# Patient Record
Sex: Female | Born: 1940 | Race: White | Hispanic: No | Smoking: Former smoker
Health system: Southern US, Community
[De-identification: ages and names within clinical notes are randomized; demographics above are authoritative.]

## PROBLEM LIST (undated history)

## (undated) DIAGNOSIS — M1990 Unspecified osteoarthritis, unspecified site: Secondary | ICD-10-CM

## (undated) DIAGNOSIS — H409 Unspecified glaucoma: Secondary | ICD-10-CM

## (undated) DIAGNOSIS — L719 Rosacea, unspecified: Secondary | ICD-10-CM

## (undated) DIAGNOSIS — K219 Gastro-esophageal reflux disease without esophagitis: Secondary | ICD-10-CM

## (undated) DIAGNOSIS — H04123 Dry eye syndrome of bilateral lacrimal glands: Secondary | ICD-10-CM

## (undated) DIAGNOSIS — Z973 Presence of spectacles and contact lenses: Secondary | ICD-10-CM

## (undated) DIAGNOSIS — I4891 Unspecified atrial fibrillation: Secondary | ICD-10-CM

## (undated) DIAGNOSIS — M8580 Other specified disorders of bone density and structure, unspecified site: Secondary | ICD-10-CM

## (undated) DIAGNOSIS — I10 Essential (primary) hypertension: Secondary | ICD-10-CM

## (undated) HISTORY — DX: Other specified disorders of bone density and structure, unspecified site: M85.80

## (undated) HISTORY — PX: COLONOSCOPY: SHX174

## (undated) HISTORY — PX: TUBAL LIGATION: SHX77

## (undated) HISTORY — DX: Unspecified atrial fibrillation: I48.91

---

## 1977-11-14 HISTORY — PX: TONSILLECTOMY AND ADENOIDECTOMY: SUR1326

## 1998-05-20 ENCOUNTER — Ambulatory Visit (HOSPITAL_COMMUNITY): Admission: RE | Admit: 1998-05-20 | Discharge: 1998-05-20 | Payer: Self-pay | Admitting: Neurosurgery

## 1998-06-17 ENCOUNTER — Encounter: Admission: RE | Admit: 1998-06-17 | Discharge: 1998-09-15 | Payer: Self-pay | Admitting: Anesthesiology

## 2003-06-04 ENCOUNTER — Ambulatory Visit (HOSPITAL_COMMUNITY): Admission: RE | Admit: 2003-06-04 | Discharge: 2003-06-04 | Payer: Self-pay | Admitting: Gastroenterology

## 2003-06-04 ENCOUNTER — Encounter (INDEPENDENT_AMBULATORY_CARE_PROVIDER_SITE_OTHER): Payer: Self-pay | Admitting: *Deleted

## 2004-01-09 ENCOUNTER — Other Ambulatory Visit: Admission: RE | Admit: 2004-01-09 | Discharge: 2004-01-09 | Payer: Self-pay | Admitting: Gynecology

## 2005-01-10 ENCOUNTER — Other Ambulatory Visit: Admission: RE | Admit: 2005-01-10 | Discharge: 2005-01-10 | Payer: Self-pay | Admitting: Gynecology

## 2006-01-20 ENCOUNTER — Other Ambulatory Visit: Admission: RE | Admit: 2006-01-20 | Discharge: 2006-01-20 | Payer: Self-pay | Admitting: Gynecology

## 2007-01-26 ENCOUNTER — Other Ambulatory Visit: Admission: RE | Admit: 2007-01-26 | Discharge: 2007-01-26 | Payer: Self-pay | Admitting: Gynecology

## 2007-12-24 ENCOUNTER — Ambulatory Visit: Payer: Self-pay | Admitting: Cardiology

## 2008-01-04 ENCOUNTER — Ambulatory Visit: Payer: Self-pay | Admitting: Cardiology

## 2008-03-07 ENCOUNTER — Other Ambulatory Visit: Admission: RE | Admit: 2008-03-07 | Discharge: 2008-03-07 | Payer: Self-pay | Admitting: Gynecology

## 2010-07-24 ENCOUNTER — Emergency Department (HOSPITAL_COMMUNITY): Admission: EM | Admit: 2010-07-24 | Discharge: 2010-07-24 | Payer: Self-pay | Admitting: Emergency Medicine

## 2011-03-29 NOTE — Procedures (Signed)
[facility]                              EXERCISE TREADMILL   NAME:Kelsey James, Kelsey James                     MRN:          [id number]  DATE:01/04/2008                            DOB:          March 06, 1941    PRIMARY:  Dr. Herb Grays.   THE REASON FOR PRESENTATION:  Evaluate the patient with dizziness and  vague chest discomfort.   PROCEDURE NOTE:  The patient exercised using standard Bruce protocol.  She was able to exercise for 6 minutes and 30 seconds.  This is 30  seconds into stage III.  Test was terminated because of fatigue and  because she had achieved her heart rate.  Peak was 148 beats per minute  which was 95% of predicted.  She had achieved 7.7 METS.  Her blood  pressure was appropriate with a max of 176/92.  She had a few PACs.  She  had no chest discomfort.  She had some dyspnea when she initially  started out, feeling like she could not get a deep breath.  However, the  further she went, the steeper it got, the better her breathing became.  There were no ischemic ST-T wave changes and she had normal heart rate  recovery.   CONCLUSIONS:  Negative adequate exercise treadmill test.   ASSESSMENT/PLAN:  Based on this.  I do not see any problem with her  blood pressure or arrhythmias, but could indicate her dizzy episodes.  Though she had some subjective complaints of shortness of breath, this  actually got better when she got more active.  At this point I would not  think that further cardiovascular testing was warranted.  Rather, she  will go back to Dr. Collins Scotland to discuss whether these dizzy episodes could  be some other mechanism such as inner ear.  Certainly, we could consider  monitoring if they continue and there is no other etiology.     Rollene Rotunda, MD, [facility]  Electronically Signed    JH/MedQ  DD: 01/04/2008  DT: 01/05/2008  Job #: [id number]   cc:   Tammy R. Collins Scotland, M.D.

## 2011-03-29 NOTE — Assessment & Plan Note (Signed)
[facility]                            CARDIOLOGY OFFICE NOTE   NAME:Kelsey James, Kelsey James                     MRN:          [id number]  DATE:12/24/2007                            DOB:          29-May-1941    REFERRING PHYSICIAN:  Tammy R. Collins Scotland, M.D.   REASON FOR CONSULTATION:  Evaluate patient with chest pain and  presyncope.   HISTORY OF PRESENT ILLNESS:  The patient is a pleasant 70 year old white  female without prior cardiac history.  She has had some palpitations for  years but not had any formal diagnosis of this.  About two weeks ago she  was in the kitchen cooking.  She felt dizzy and faint.  She had  substernal chest discomfort.  It was moderately severe.  She felt like a  fist was tight in her chest.  She went out of the kitchen and told her  husband.  She was noted to have a blood pressure that was elevated at  164/diastolics in the 90s.  She felt discomfort for about 10-15 minutes.  She sat down.  Through the course of the evening, she had resolution of  her blood pressure.  She has had this sporadic chest pain much lighter  than this off and on.  This has been at rest.  She has not been able to  bring this on.  She was a little lightheaded yesterday in church, but  has had none of the dizziness that she had that day two weeks ago.  She  has not had any frank syncope.  She has not associated this chest  discomfort of an episode with any of the palpitations.  These are  infrequent, though a little bit more frequent she thinks recently.  She  is active doing yard work and housework such as vacuuming.  She cannot  bring on these symptoms.  She cannot bring on any substernal chest  pressure, neck or arm discomfort.  She does not have any excessive  shortness of breath.  She has no symptoms at rest.  She has no PND or  orthopnea.  She did also complain of numbness in her neck.  This  happened lying flat.  She noticed numbness radiating down to  the tips of  her fingers.   PAST MEDICAL HISTORY:  1. Palpitations.  2. Insomnia.   PAST SURGICAL HISTORY:  Tonsillectomy.   ALLERGIES:  None.   MEDICATIONS:  Magnesium, calcium, multivitamin, vitamin C, fish oil.   SOCIAL HISTORY:  The patient quit smoking in 2004 after less than a pack  per day for 30 years.  She occasionally drinks wine.   FAMILY HISTORY:  Noncontributory for early coronary artery disease.  Her  mother died with valve disease.   REVIEW OF SYSTEMS:  As stated in the HPI and positive for joint pain.  Negative for other systems.   PHYSICAL EXAMINATION:  GENERAL APPEARANCE:  The patient is in no  distress.  VITAL SIGNS:  Blood 112/70, heart rate 70 and regular, weight 129  pounds, body mass index 22.  HEENT:  Eyelids unremarkable,.  Pupils equal,  round and reactive to  light, fundi well visualized, obvious changes of histoplasmosis, oral  mucosa unremarkable.  NECK:  No jugular venous distension at 45 degrees, carotid upstroke  brisk and symmetric.  No bruits, no thyromegaly.  LYMPHATICS:  No cervical, axillary or inguinal adenopathy.  LUNGS:  Clear to auscultation bilaterally.  BACK:  No costovertebral angle tenderness.  CHEST:  Unremarkable.  HEART:  PMI not displaced or sustained, S1-S2 within normal limits.  No  S3, no S4, no clicks, no rubs, no murmurs.  ABDOMEN:  Flat, positive bowel sounds, normal in frequency and pitch, no  bruits, no rebound, no guarding, no midline pulsatile mass.  No  hepatomegaly, no splenomegaly.  SKIN:  No rashes, no nodules.  EXTREMITIES:  She has 2+ pulses throughout, no edema, no cyanosis, no  clubbing.  NEUROLOGIC:  Oriented to person, place and time.  Cranial nerves II-XII  grossly intact, motor grossly intact throughout.   EKG (Dr. Alda Berthold office), sinus rhythm, rate 60, axis within normal  limits, intervals within normal limits, poor anterior R wave  progression, no acute ST-T wave changes.   ASSESSMENT/PLAN:  1.  Chest discomfort.  The patient had an episode of chest discomfort      that was somewhat severe a couple of weeks ago and then lesser      episode since then.  She does have the risk factor of tobacco.  I      think the pretest probability of obstructive coronary disease      leading to these symptoms is moderately low.  However, she should      be screened.  I will perform a POET (plain old exercise treadmill      test).  Think this would be reasonable for excluding obstructive      coronary disease.  This will also allow me to give her a      prescription for exercise and risk stratify.  2. Palpitations.  She does describe these.  However, this does not      seem like this is what caused her episode.  If she has an otherwise      normal heart and these are not particularly symptomatic, I would      probably not pursue further evaluation outside of routine labs to      include magnesium, potassium and TSH.  I will defer to Dr. Collins Scotland to      draw these.  3. Follow-up.  I will see her at the time of her treadmill.     Rollene Rotunda, MD, [facility]  Electronically Signed    JH/MedQ  DD: 12/24/2007  DT: 12/25/2007  Job #: [id number]

## 2011-04-01 NOTE — Op Note (Signed)
NAME:  Kelsey James, Kelsey James                        ACCOUNT NO.:  [id number]   MEDICAL RECORD NO.:  [id number]                   PATIENT TYPE:  AMB   LOCATION:  ENDO                                 FACILITY:  [facility]   PHYSICIAN:  Anselmo Rod, M.D.               DATE OF BIRTH:  14-Feb-1941   DATE OF PROCEDURE:  06/04/2003  DATE OF DISCHARGE:                                 OPERATIVE REPORT   PROCEDURE PERFORMED:  Colonoscopy with cold biopsies times two.   ENDOSCOPIST:  Charna Elizabeth, M.D.   INSTRUMENT USED:  Olympus video colonoscope.   INDICATIONS FOR PROCEDURE:  The patient is a 70 year old white female with  history of bright red blood per rectum.  Undergoing screening colonoscopy to  rule out colonic polyps, masses, etc.   PREPROCEDURE PREPARATION:  Informed consent was procured from the patient.  The patient was fasted for eight hours prior to the procedure and prepped  with a bottle of magnesium citrate and a gallon of GoLYTELY the night prior  to the procedure.   PREPROCEDURE PHYSICAL:  The patient had stable vital signs.  Neck supple.  Chest clear to auscultation.  S1 and S2 regular.  Abdomen soft with normal  bowel sounds.   DESCRIPTION OF PROCEDURE:  The patient was placed in left lateral decubitus  position and sedated with 70 mg of Demerol and 7 mg of Versed intravenously.  MVP prophylaxis.  Once the patient was adequately sedated and maintained on  low flow oxygen and continuous cardiac monitoring, the Olympus video  colonoscope was advanced from the rectum to the cecum and terminal ileum.  The appendicular orifice and ileocecal valve were clearly visualized and  photographed.  The terminal ileum appeared normal.  There was some residual  stool in the right colon and cecum.  Multiple washes were done.  Two small  sessile polyps were biopsied from the rectosigmoid area at 15 cm.  No  evidence of diverticulosis was noted.  No large masses or polyps were  present.   Retroflexion in the rectum revealed small nonbleeding internal  hemorrhoids.  The patient tolerated the procedure well without  complications.   IMPRESSION:  1. Small nonbleeding internal hemorrhoid.  2. Two small sessile polyps biopsied from the rectosigmoid area at 15 cm.  3. Otherwise normal colonoscopy of the terminal ileum.  4. Some residual stool in the cecum and right colon.  Multiple washes done.  5. No diverticulosis noted.   RECOMMENDATIONS:  1. Await pathology results.  2. Avoid all nonsteroidals including aspirin for the next two weeks.  3. A high fiber diet with liberal fluid intake.  4. Repeat colorectal cancer screening depending on pathology results or     earlier if the patient develops any abnormal GI symptoms in the interim.  5. Outpatient follow-up for further recommendations.  Anselmo Rod, M.D.    JNM/MEDQ  D:  06/04/2003  T:  06/04/2003  Job:  [id number]   cc:   Tammy R. Collins Scotland, M.D.  [address]  [place]  [place] [address]  Fax: [phone]

## 2012-02-08 ENCOUNTER — Other Ambulatory Visit: Payer: Self-pay | Admitting: Oral Surgery

## 2012-02-08 DIAGNOSIS — R6884 Jaw pain: Secondary | ICD-10-CM

## 2012-03-13 ENCOUNTER — Ambulatory Visit
Admission: RE | Admit: 2012-03-13 | Discharge: 2012-03-13 | Disposition: A | Discharge status: Home / Self Care | Payer: Medicare Other | Source: Ambulatory Visit | Attending: Oral Surgery | Admitting: Oral Surgery

## 2012-03-13 DIAGNOSIS — R6884 Jaw pain: Secondary | ICD-10-CM

## 2013-12-11 DIAGNOSIS — H16109 Unspecified superficial keratitis, unspecified eye: Secondary | ICD-10-CM | POA: Diagnosis not present

## 2013-12-11 DIAGNOSIS — H4060X0 Glaucoma secondary to drugs, unspecified eye, stage unspecified: Secondary | ICD-10-CM | POA: Diagnosis not present

## 2013-12-11 DIAGNOSIS — H04129 Dry eye syndrome of unspecified lacrimal gland: Secondary | ICD-10-CM | POA: Diagnosis not present

## 2013-12-17 DIAGNOSIS — H409 Unspecified glaucoma: Secondary | ICD-10-CM | POA: Diagnosis not present

## 2013-12-17 DIAGNOSIS — H04129 Dry eye syndrome of unspecified lacrimal gland: Secondary | ICD-10-CM | POA: Diagnosis not present

## 2013-12-17 DIAGNOSIS — H4050X0 Glaucoma secondary to other eye disorders, unspecified eye, stage unspecified: Secondary | ICD-10-CM | POA: Diagnosis not present

## 2013-12-23 ENCOUNTER — Emergency Department (HOSPITAL_COMMUNITY)
Admission: EM | Admit: 2013-12-23 | Discharge: 2013-12-23 | Disposition: A | Discharge status: Home / Self Care | Payer: Medicare Other | Attending: Emergency Medicine | Admitting: Emergency Medicine

## 2013-12-23 ENCOUNTER — Emergency Department (HOSPITAL_COMMUNITY): Payer: Medicare Other

## 2013-12-23 ENCOUNTER — Encounter (HOSPITAL_COMMUNITY): Payer: Self-pay | Admitting: Emergency Medicine

## 2013-12-23 DIAGNOSIS — I10 Essential (primary) hypertension: Secondary | ICD-10-CM

## 2013-12-23 DIAGNOSIS — Z792 Long term (current) use of antibiotics: Secondary | ICD-10-CM | POA: Insufficient documentation

## 2013-12-23 DIAGNOSIS — R42 Dizziness and giddiness: Secondary | ICD-10-CM | POA: Diagnosis not present

## 2013-12-23 DIAGNOSIS — Z79899 Other long term (current) drug therapy: Secondary | ICD-10-CM | POA: Insufficient documentation

## 2013-12-23 DIAGNOSIS — R011 Cardiac murmur, unspecified: Secondary | ICD-10-CM | POA: Diagnosis not present

## 2013-12-23 DIAGNOSIS — R51 Headache: Secondary | ICD-10-CM | POA: Diagnosis not present

## 2013-12-23 DIAGNOSIS — R110 Nausea: Secondary | ICD-10-CM | POA: Insufficient documentation

## 2013-12-23 DIAGNOSIS — Z872 Personal history of diseases of the skin and subcutaneous tissue: Secondary | ICD-10-CM | POA: Insufficient documentation

## 2013-12-23 DIAGNOSIS — Z8739 Personal history of other diseases of the musculoskeletal system and connective tissue: Secondary | ICD-10-CM | POA: Diagnosis not present

## 2013-12-23 DIAGNOSIS — Z87891 Personal history of nicotine dependence: Secondary | ICD-10-CM | POA: Insufficient documentation

## 2013-12-23 DIAGNOSIS — H9319 Tinnitus, unspecified ear: Secondary | ICD-10-CM | POA: Insufficient documentation

## 2013-12-23 DIAGNOSIS — H409 Unspecified glaucoma: Secondary | ICD-10-CM | POA: Insufficient documentation

## 2013-12-23 HISTORY — DX: Rosacea, unspecified: L71.9

## 2013-12-23 HISTORY — DX: Unspecified osteoarthritis, unspecified site: M19.90

## 2013-12-23 HISTORY — DX: Unspecified glaucoma: H40.9

## 2013-12-23 HISTORY — DX: Dry eye syndrome of bilateral lacrimal glands: H04.123

## 2013-12-23 LAB — URINALYSIS, ROUTINE W REFLEX MICROSCOPIC
Bilirubin Urine: NEGATIVE
Glucose, UA: NEGATIVE mg/dL
Hgb urine dipstick: NEGATIVE
LEUKOCYTES UA: NEGATIVE
Nitrite: NEGATIVE
PROTEIN: NEGATIVE mg/dL
Specific Gravity, Urine: <1.005 — ABNORMAL LOW (ref 1.005–1.030)
Urobilinogen, UA: 0.2 mg/dL (ref 0.0–1.0)
pH: 5.5 (ref 5.0–8.0)

## 2013-12-23 LAB — BASIC METABOLIC PANEL
BUN: 11 mg/dL (ref 6–23)
CALCIUM: 9 mg/dL (ref 8.4–10.5)
CHLORIDE: 99 meq/L (ref 96–112)
CO2: 27 mEq/L (ref 19–32)
CREATININE: 0.53 mg/dL (ref 0.50–1.10)
GFR calc non Af Amer: >90 mL/min (ref >90)
Glucose, Bld: 90 mg/dL (ref 70–99)
Potassium: 3.7 mEq/L (ref 3.7–5.3)
Sodium: 138 mEq/L (ref 137–147)

## 2013-12-23 LAB — CBC
HCT: 38.3 % (ref 36.0–46.0)
Hemoglobin: 13.3 g/dL (ref 12.0–15.0)
MCH: 31.7 pg (ref 26.0–34.0)
MCHC: 34.7 g/dL (ref 30.0–36.0)
MCV: 91.4 fL (ref 78.0–100.0)
PLATELETS: 234 10*3/uL (ref 150–400)
RBC: 4.19 MIL/uL (ref 3.87–5.11)
RDW: 14.3 % (ref 11.5–15.5)
WBC: 7.3 10*3/uL (ref 4.0–10.5)

## 2013-12-23 LAB — HEPATIC FUNCTION PANEL
ALT: 15 U/L (ref 0–35)
AST: 16 U/L (ref 0–37)
Albumin: 4 g/dL (ref 3.5–5.2)
Alkaline Phosphatase: 39 U/L (ref 39–117)
Total Bilirubin: 0.5 mg/dL (ref 0.3–1.2)
Total Protein: 6.6 g/dL (ref 6.0–8.3)

## 2013-12-23 LAB — GLUCOSE, CAPILLARY: Glucose-Capillary: 73 mg/dL (ref 70–99)

## 2013-12-23 MED ORDER — SODIUM CHLORIDE 0.9 % IV BOLUS (SEPSIS)
1000.0000 mL | Freq: Once | INTRAVENOUS | Status: AC
  Administered 2013-12-23: 1000 mL via INTRAVENOUS

## 2013-12-23 NOTE — ED Provider Notes (Signed)
CSN: [id number]     Arrival date & time 12/23/13  1547 History  This chart was scribed for Maudry Diego, MD by Elby Beck, ED Scribe. This patient was seen in room APA02/APA02 and the patient's care was started at 6:26 PM.  Chief Complaint  Patient presents with  . Dizziness    Patient is a 73 y.o. female presenting with dizziness. The history is provided by the patient. No language interpreter was used.  Dizziness Quality:  Unable to specify Severity:  Moderate Onset quality:  Gradual Duration:  3 days Timing:  Intermittent Progression:  Worsening Chronicity:  New Context comment:  Believes this may be related to higher than usual BP readings at home recently Relieved by:  None tried Worsened by:  Nothing tried Ineffective treatments:  None tried Associated symptoms: nausea and tinnitus   Associated symptoms: no chest pain, no diarrhea, no headaches and no vomiting   Risk factors: hx of vertigo     HPI Comments: Kelsey James is a 73 y.o. female who presents to the Emergency Department complaining of intermittent episodes of dizziness over the past 3 days. She also reports associated intermittent nausea and tinnitus and states that she has been hearing a "swishing" sound over the past 3 days. She further states that she has been "feeling funny" over the past 3 days. In describing this feeling, she states "I feel at times like I'm not really here and like I'm descending". She reports that she has a history of vertigo, but that these symptoms feel different. She states that she has been taking her blood pressure at home recently, and that she took a BP of 180/92 when her dizziness onset, and a highest BP of 204/89 this afternoon. She also states that over the past 3 days she has taken what she considers to be normal BPs, such as 131/75. She denies vomiting or any other pain or symptoms.   Past Medical History  Diagnosis Date  . Glaucoma   . Rosacea   . Dry eyes   . Arthritis    . Heart murmur    History reviewed. No pertinent past surgical history. History reviewed. No pertinent family history. History  Substance Use Topics  . Smoking status: Former Smoker -- 0.50 packs/day for 25 years    Types: Cigarettes    Quit date: 11/22/2002  . Smokeless tobacco: Never Used  . Alcohol Use: 8.4 oz/week    14 Glasses of wine per week   OB History   Grav Para Term Preterm Abortions TAB SAB Ect Mult Living   2 2  2      2      Review of Systems  Constitutional: Negative for appetite change and fatigue.  HENT: Positive for tinnitus. Negative for congestion, ear discharge and sinus pressure.   Eyes: Negative for discharge.  Respiratory: Negative for cough.   Cardiovascular: Negative for chest pain.  Gastrointestinal: Positive for nausea. Negative for vomiting, abdominal pain and diarrhea.  Genitourinary: Negative for frequency and hematuria.  Musculoskeletal: Negative for back pain.  Skin: Negative for rash.  Neurological: Positive for dizziness. Negative for seizures and headaches.  Psychiatric/Behavioral: Negative for hallucinations.  All other systems reviewed and are negative.   Allergies  Other and Shellfish allergy  Home Medications   Current Outpatient Rx  Name  Route  Sig  Dispense  Refill  . Calcium Carbonate-Vitamin D (CALCIUM 500 + D PO)   Oral   Take 1 tablet by mouth 3 (three)  times daily.         . Calcium-Magnesium (CALCIUM MAGNESIUM 750) 300-300 MG TABS   Oral   Take 1 tablet by mouth 3 (three) times daily.         . cholecalciferol (VITAMIN D) 1000 UNITS tablet   Oral   Take 1,000 Units by mouth daily.         . cycloSPORINE (RESTASIS) 0.05 % ophthalmic emulsion   Both Eyes   Place 1 drop into both eyes 2 (two) times daily.         Marland Kitchen doxycycline (VIBRAMYCIN) 50 MG capsule   Oral   Take 50 mg by mouth at bedtime.          . Lutein 10 MG TABS   Oral   Take 10 mg by mouth daily.         Marland Kitchen omega-3 acid ethyl esters  (LOVAZA) 1 G capsule   Oral   Take 4 g by mouth daily.         . timolol (TIMOPTIC) 0.5 % ophthalmic solution   Both Eyes   Place 1 drop into both eyes 2 (two) times daily.         . vitamin B-12 (CYANOCOBALAMIN) 1000 MCG tablet   Oral   Take 1,000 mcg by mouth daily.           Triage Vitals: BP 175/82  Pulse 52  Temp(Src) 97.7 F (36.5 C) (Oral)  Resp 18  Ht 5\' 4"  (1.626 m)  Wt 122 lb (55.339 kg)  BMI 20.93 kg/m2  SpO2 100%  Physical Exam  Nursing note and vitals reviewed. Constitutional: She is oriented to person, place, and time. She appears well-developed.  HENT:  Head: Normocephalic.  Eyes: Conjunctivae and EOM are normal. No scleral icterus.  Neck: Neck supple. No thyromegaly present.  Cardiovascular: Normal rate and regular rhythm.  Exam reveals no gallop and no friction rub.   No murmur heard. Pulmonary/Chest: No stridor. She has no wheezes. She has no rales. She exhibits no tenderness.  Abdominal: She exhibits no distension. There is no tenderness. There is no rebound.  Musculoskeletal: Normal range of motion. She exhibits no edema.  Lymphadenopathy:    She has no cervical adenopathy.  Neurological: She is oriented to person, place, and time. She exhibits normal muscle tone. Coordination normal.  Skin: No rash noted. No erythema.  Psychiatric: She has a normal mood and affect. Her behavior is normal.    ED Course  Procedures (including critical care time)  DIAGNOSTIC STUDIES: Oxygen Saturation is 100% on RA, normal by my interpretation.    COORDINATION OF CARE: 6:29 PM- Discussed plan to obtain diagnostic radiology and lab work. Pt advised of plan for treatment and pt agrees.  9:57 PM- Recheck and pt states that she is feeling better. Discussed normal lab work findings.  Medications  sodium chloride 0.9 % bolus 1,000 mL (0 mLs Intravenous Stopped 12/23/13 2004)   Labs Review Labs Reviewed  URINALYSIS, ROUTINE W REFLEX MICROSCOPIC - Abnormal;  Notable for the following:    Specific Gravity, Urine <1.005 (*)    Ketones, ur TRACE (*)    All other components within normal limits  CBC  BASIC METABOLIC PANEL  HEPATIC FUNCTION PANEL  GLUCOSE, CAPILLARY   Imaging Review Dg Chest 2 View  12/23/2013   CLINICAL DATA:  Dizziness, elevated blood pressure  EXAM: CHEST  2 VIEW  COMPARISON:  None.  FINDINGS: The heart size and mediastinal contours are within  normal limits. The aorta is tortuous. There is no focal infiltrate, pulmonary edema, or pleural effusion. Bilateral symmetrical nipple shadows are noted. The visualized skeletal structures are unremarkable.  IMPRESSION: No active cardiopulmonary disease.   Electronically Signed   By: Abelardo Diesel M.D.   On: 12/23/2013 19:18   Ct Head Wo Contrast  12/23/2013   CLINICAL DATA:  Headache.  Hypertension.  EXAM: CT HEAD WITHOUT CONTRAST  TECHNIQUE: Contiguous axial images were obtained from the base of the skull through the vertex without intravenous contrast.  COMPARISON:  None.  FINDINGS: There is a CSF density lesion over the right parietal convexities measuring 2.9 x 1.3 cm on image 21. The overlying calvarium is unremarkable in appearance. There is no hemorrhage, midline shift, acute infarction, hydrocephalus or pneumocephalus. The calvarium is intact.  IMPRESSION: No acute abnormality.  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 Nonemergent MRI of the brain with and without contrast could be used for definitive characterization.   Electronically Signed   By: Inge Rise M.D.   On: 12/23/2013 19:26    EKG Interpretation    Date/Time:  Monday December 23 2013 16:26:25 EST Ventricular Rate:  58 PR Interval:  148 QRS Duration: 84 QT Interval:  460 QTC Calculation: 451 R Axis:   59 Text Interpretation:  Sinus bradycardia Otherwise normal ECG No previous ECGs available Confirmed by Sakeena Teall  MD, Dylin Breeden ([id number]) on 12/23/2013  10:05:03 PM            MDM  Dizziness improved.  Pt to follow up for mri Final diagnoses:  None    The chart was scribed for me under my direct supervision.  I personally performed the history, physical, and medical decision making and all procedures in the evaluation of this patient.Maudry Diego, MD 12/23/13 [phone]

## 2013-12-23 NOTE — Discharge Instructions (Signed)
Follow up with your md this week.  Return if any problems °

## 2013-12-23 NOTE — ED Notes (Signed)
Pt reports that she is still feeling dizzy when standing up and walking. Able to ambulate to the bathroom in straight line with stand-by assist with no distress.

## 2013-12-23 NOTE — ED Notes (Signed)
Patient c/o dizziness since Saturday with a "whooshing" and ringing in ears. Denies any weakness or blurred vision. Per husband patient's blood pressure 204/89 at 3:30pm today. Patient reports nausea but no vomiting. Patient states she has a "sinking, floating feeling."

## 2013-12-25 DIAGNOSIS — H2510 Age-related nuclear cataract, unspecified eye: Secondary | ICD-10-CM | POA: Diagnosis not present

## 2013-12-25 DIAGNOSIS — I10 Essential (primary) hypertension: Secondary | ICD-10-CM | POA: Diagnosis not present

## 2013-12-25 DIAGNOSIS — Z5181 Encounter for therapeutic drug level monitoring: Secondary | ICD-10-CM | POA: Diagnosis not present

## 2013-12-25 DIAGNOSIS — R51 Headache: Secondary | ICD-10-CM | POA: Diagnosis not present

## 2013-12-25 DIAGNOSIS — H0289 Other specified disorders of eyelid: Secondary | ICD-10-CM | POA: Diagnosis not present

## 2013-12-25 DIAGNOSIS — G609 Hereditary and idiopathic neuropathy, unspecified: Secondary | ICD-10-CM | POA: Diagnosis not present

## 2013-12-25 DIAGNOSIS — R42 Dizziness and giddiness: Secondary | ICD-10-CM | POA: Diagnosis not present

## 2013-12-25 DIAGNOSIS — E785 Hyperlipidemia, unspecified: Secondary | ICD-10-CM | POA: Diagnosis not present

## 2013-12-25 DIAGNOSIS — M899 Disorder of bone, unspecified: Secondary | ICD-10-CM | POA: Diagnosis not present

## 2013-12-25 DIAGNOSIS — H04129 Dry eye syndrome of unspecified lacrimal gland: Secondary | ICD-10-CM | POA: Diagnosis not present

## 2013-12-25 DIAGNOSIS — M949 Disorder of cartilage, unspecified: Secondary | ICD-10-CM | POA: Diagnosis not present

## 2013-12-25 DIAGNOSIS — G930 Cerebral cysts: Secondary | ICD-10-CM | POA: Diagnosis not present

## 2013-12-25 DIAGNOSIS — H9319 Tinnitus, unspecified ear: Secondary | ICD-10-CM | POA: Diagnosis not present

## 2013-12-25 DIAGNOSIS — H02409 Unspecified ptosis of unspecified eyelid: Secondary | ICD-10-CM | POA: Diagnosis not present

## 2013-12-27 ENCOUNTER — Other Ambulatory Visit (HOSPITAL_COMMUNITY): Payer: Self-pay | Admitting: Family Medicine

## 2013-12-27 ENCOUNTER — Ambulatory Visit (HOSPITAL_COMMUNITY)
Admission: RE | Admit: 2013-12-27 | Discharge: 2013-12-27 | Disposition: A | Discharge status: Home / Self Care | Payer: Medicare Other | Source: Ambulatory Visit | Attending: Vascular Surgery | Admitting: Vascular Surgery

## 2013-12-27 DIAGNOSIS — H93A3 Pulsatile tinnitus, bilateral: Secondary | ICD-10-CM

## 2013-12-27 DIAGNOSIS — H9319 Tinnitus, unspecified ear: Secondary | ICD-10-CM | POA: Diagnosis not present

## 2013-12-30 DIAGNOSIS — H4050X0 Glaucoma secondary to other eye disorders, unspecified eye, stage unspecified: Secondary | ICD-10-CM | POA: Diagnosis not present

## 2013-12-30 DIAGNOSIS — H04129 Dry eye syndrome of unspecified lacrimal gland: Secondary | ICD-10-CM | POA: Diagnosis not present

## 2013-12-30 DIAGNOSIS — H32 Chorioretinal disorders in diseases classified elsewhere: Secondary | ICD-10-CM | POA: Diagnosis not present

## 2013-12-30 DIAGNOSIS — B399 Histoplasmosis, unspecified: Secondary | ICD-10-CM | POA: Diagnosis not present

## 2013-12-30 DIAGNOSIS — H40049 Steroid responder, unspecified eye: Secondary | ICD-10-CM | POA: Diagnosis not present

## 2013-12-30 DIAGNOSIS — H409 Unspecified glaucoma: Secondary | ICD-10-CM | POA: Diagnosis not present

## 2014-01-14 ENCOUNTER — Encounter: Payer: Self-pay | Admitting: Internal Medicine

## 2014-01-14 ENCOUNTER — Ambulatory Visit (INDEPENDENT_AMBULATORY_CARE_PROVIDER_SITE_OTHER): Payer: Medicare Other | Admitting: Internal Medicine

## 2014-01-14 VITALS — BP 118/68 | HR 54 | Ht 64.0 in | Wt 118.7 lb

## 2014-01-14 DIAGNOSIS — I10 Essential (primary) hypertension: Secondary | ICD-10-CM | POA: Insufficient documentation

## 2014-01-14 DIAGNOSIS — R0789 Other chest pain: Secondary | ICD-10-CM | POA: Insufficient documentation

## 2014-01-14 DIAGNOSIS — R600 Localized edema: Secondary | ICD-10-CM | POA: Insufficient documentation

## 2014-01-14 DIAGNOSIS — R609 Edema, unspecified: Secondary | ICD-10-CM | POA: Diagnosis not present

## 2014-01-14 NOTE — Progress Notes (Signed)
OFFICE NOTE  Chief Complaint:  Uncontrolled HTN, chest tightness  Primary Care Physician: Florina Ou, MD  HPI:  Kelsey James is a pleasant 73 year old female with little past medical history. For years she's had very low blood pressure however recently her blood pressure has spiked up into the upper 180's to over 119 systolic. She recently presented to the emergency room and any pain with symptoms such as throbbing in her neck and head with uncontrolled blood pressure. Subsequently she was started on low-dose amlodipine and she's had a nice improvement in her blood pressure. She does however report lower extremity swelling in her feet. She also describes what I believe is chest tightness. She actually says that she feels that her chest is "caving in" or "deflating". There is some associated sporadic shortness of breath but it has improved with her blood pressure medicine. She does do some activity and exercise and reports that her symptoms improve with exercise.  PMHx:  Past Medical History  Diagnosis Date  . Glaucoma   . Rosacea   . Dry eyes   . Arthritis   . Heart murmur     Past Surgical History  Procedure Laterality Date  . Tonsillectomy and adenoidectomy  age 60    FAMHx:  Family History  Problem Relation Age of Onset  . Valvular heart disease Mother   . Cancer Maternal Grandmother   . Depression Father     SOCHx:   reports that she quit smoking about 11 years ago. Her smoking use included Cigarettes. She has a 12.5 pack-year smoking history. She has never used smokeless tobacco. She reports that she drinks about 4.2 ounces of alcohol per week. She reports that she does not use illicit drugs.  ALLERGIES:  Allergies  Allergen Reactions  . Other Nausea And Vomiting    Mushrooms   . Shellfish Allergy Nausea And Vomiting    ROS: A comprehensive review of systems was negative except for: Cardiovascular: positive for chest pressure/discomfort and  dyspnea  HOME MEDS: Current Outpatient Prescriptions  Medication Sig Dispense Refill  . amLODipine (NORVASC) 5 MG tablet Take 1 tablet by mouth daily.      Marland Kitchen azelastine (ASTELIN) 137 MCG/SPRAY nasal spray Place 1 spray into both nostrils 2 (two) times daily. Use in each nostril as directed      . b complex vitamins capsule Take 1 capsule by mouth daily.      Marland Kitchen BIOTIN PO Take 1 capsule by mouth daily.      . Calcium-Magnesium (CALCIUM MAGNESIUM 750) 300-300 MG TABS Take 1 tablet by mouth 3 (three) times daily.      . cycloSPORINE (RESTASIS) 0.05 % ophthalmic emulsion Place 1 drop into both eyes 2 (two) times daily.      . fluticasone (CUTIVATE) 0.05 % cream Apply 1 application topically daily as needed.      . hydrocortisone cream 1 % Apply 1 application topically daily as needed for itching.      . hydroxypropyl methylcellulose (ISOPTO TEARS) 2.5 % ophthalmic solution Place 1 drop into both eyes as needed for dry eyes.      Marland Kitchen LECITHIN PO Take 1 capsule by mouth daily.      . LUTEIN PO Take 1 capsule by mouth daily.      Marland Kitchen MAGNESIUM PO Take by mouth daily.      . NON FORMULARY Apply 1 drop to eye 4 (four) times daily. [facility] - natural tear solution made of patient's blood/protein      .  Omega-3 Fatty Acids (FISH OIL PO) Take 4 capsules by mouth daily.      Marland Kitchen OVER THE COUNTER MEDICATION Take 2 capsules by mouth daily. Vegetal Silica      . OVER THE COUNTER MEDICATION Take 1 capsule by mouth daily. Perfect Eyes supplement      . timolol (TIMOPTIC) 0.5 % ophthalmic solution Place 1 drop into both eyes 2 (two) times daily.      . TURMERIC PO Take 1 capsule by mouth daily.       No current facility-administered medications for this visit.    LABS/IMAGING: No results found for this or any previous visit (from the past 48 hour(s)). No results found.  VITALS: BP 118/68  Pulse 54  Ht 5\' 4"  (1.626 m)  Wt 118 lb 11.2 oz (53.842 kg)  BMI 20.36 kg/m2  EXAM: General appearance: alert and  no distress Neck: no carotid bruit, no JVD and thyroid not enlarged, symmetric, no tenderness/mass/nodules Lungs: clear to auscultation bilaterally Heart: regular rate and rhythm, S1, S2 normal, no murmur, click, rub or gallop Abdomen: soft, non-tender; bowel sounds normal; no masses,  no organomegaly Extremities: edema trace pedal edema Pulses: 2+ and symmetric Skin: Skin color, texture, turgor normal. No rashes or lesions Neurologic: Grossly normal Psych: Mood, affect normal  EKG:  Sinus bradycardia 54  ASSESSMENT: 1. Malignant hypertension 2. Chest pressure 3. Bradycardia 4. Dyspnea and exertion  PLAN: 1.   Mrs. Villescas his had uncontrolled hypertension which is new and unusual for her. There may be some associated chest tightness and a feeling of "deflating". This could be an anginal equivalent. I would recommend stress testing to rule out ischemia as a possible cause of her elevated blood pressure. Actually, her blood pressure is very well controlled today. She is however complaining of lower extremity swelling, mostly in her feet. I did assure her that we could consider changing her medication to an alternative antihypertensive agent with less likelihood of causing swelling. Plan to see her back in a few weeks to discuss results of her stress test and most likely I will adjust medication at that time.  Thank you again for the kind referral.  Pixie Casino, MD, [facility] Attending Cardiologist [facility]  HILTY,Kenneth C 01/14/2014, 4:23 PM

## 2014-01-14 NOTE — Patient Instructions (Signed)
Your physician has requested that you have a lexiscan myoview. For further information please visit [email or web address]. Please follow instruction sheet, as given.  Your physician recommends that you schedule a follow-up appointment in 2-3 weeks, after your stress test.   

## 2014-01-15 DIAGNOSIS — D235 Other benign neoplasm of skin of trunk: Secondary | ICD-10-CM | POA: Diagnosis not present

## 2014-01-15 DIAGNOSIS — L259 Unspecified contact dermatitis, unspecified cause: Secondary | ICD-10-CM | POA: Diagnosis not present

## 2014-01-16 ENCOUNTER — Encounter (HOSPITAL_COMMUNITY): Payer: Medicare Other

## 2014-01-20 ENCOUNTER — Telehealth (HOSPITAL_COMMUNITY): Payer: Self-pay | Admitting: *Deleted

## 2014-01-21 DIAGNOSIS — M899 Disorder of bone, unspecified: Secondary | ICD-10-CM | POA: Diagnosis not present

## 2014-01-21 DIAGNOSIS — M949 Disorder of cartilage, unspecified: Secondary | ICD-10-CM | POA: Diagnosis not present

## 2014-01-21 DIAGNOSIS — Z1231 Encounter for screening mammogram for malignant neoplasm of breast: Secondary | ICD-10-CM | POA: Diagnosis not present

## 2014-01-31 ENCOUNTER — Ambulatory Visit (HOSPITAL_COMMUNITY)
Admission: RE | Admit: 2014-01-31 | Discharge: 2014-01-31 | Disposition: A | Discharge status: Home / Self Care | Payer: Medicare Other | Source: Ambulatory Visit | Attending: Internal Medicine | Admitting: Internal Medicine

## 2014-01-31 DIAGNOSIS — R0789 Other chest pain: Secondary | ICD-10-CM | POA: Diagnosis not present

## 2014-01-31 DIAGNOSIS — I10 Essential (primary) hypertension: Secondary | ICD-10-CM | POA: Diagnosis not present

## 2014-01-31 MED ORDER — TECHNETIUM TC 99M SESTAMIBI GENERIC - CARDIOLITE
10.3000 | Freq: Once | INTRAVENOUS | Status: AC | PRN
  Administered 2014-01-31: 10 via INTRAVENOUS

## 2014-01-31 MED ORDER — AMINOPHYLLINE 25 MG/ML IV SOLN
75.0000 mg | Freq: Once | INTRAVENOUS | Status: AC
  Administered 2014-01-31: 75 mg via INTRAVENOUS

## 2014-01-31 MED ORDER — REGADENOSON 0.4 MG/5ML IV SOLN
0.4000 mg | Freq: Once | INTRAVENOUS | Status: AC
  Administered 2014-01-31: 0.4 mg via INTRAVENOUS

## 2014-01-31 MED ORDER — TECHNETIUM TC 99M SESTAMIBI GENERIC - CARDIOLITE
30.5000 | Freq: Once | INTRAVENOUS | Status: AC | PRN
  Administered 2014-01-31: 31 via INTRAVENOUS

## 2014-01-31 NOTE — Procedures (Addendum)
[facility] [address] [place] [address] [phone]  Cardiology Nuclear Med Study  Kelsey James is a 73 y.o. female     MRN : [id number]     DOB: 03/31/1941  Procedure Date: 01/31/2014  Nuclear Med Background Indication for Stress Test:  Evaluation for Ischemia and Abnormal EKG History:  murmur;No prior cardiac or respiratory history reported. No prior NUC study for comparrison. Cardiac Risk Factors: Family History - CAD, History of Smoking and Hypertension  Symptoms:  Chest Pain, Dizziness, DOE, Light-Headedness, Palpitations and SOB   Nuclear Pre-Procedure Caffeine/Decaff Intake:  9:00pm NPO After: 7:00am   IV Site: R Forearm  IV 0.9% NS with Angio Cath:  22g  Chest Size (in):  n/a IV Started by: Azucena Cecil, RN  Height: 5\' 4"  (1.626 m)  Cup Size: B  BMI:  Body mass index is 20.24 kg/(m^2). Weight:  118 lb (53.524 kg)   Tech Comments:  n/a    Nuclear Med Study 1 or 2 day study: 1 day  Stress Test Type:  [place] Provider:  Charlyn Minerva, MD   Resting Radionuclide: Technetium 16m Sestamibi  Resting Radionuclide Dose: 10.3 mCi   Stress Radionuclide:  Technetium 75m Sestamibi  Stress Radionuclide Dose: 30.5 mCi           Stress Protocol Rest HR: 47 Stress HR: 72  Rest BP: 125/78 Stress BP: 120/68  Exercise Time (min): n/a METS: n/a   Predicted Max HR: 149 bpm % Max HR: 51.68 bpm Rate Pressure Product: 9625  Dose of Adenosine (mg):  n/a Dose of Lexiscan: 0.4 mg  Dose of Atropine (mg): n/a Dose of Dobutamine: n/a mcg/kg/min (at max HR)  Stress Test Technologist: Leane Para, CCT Nuclear Technologist: Imagene Riches, CNMT   Rest Procedure:  Myocardial perfusion imaging was performed at rest 45 minutes following the intravenous administration of Technetium 44m Sestamibi. Stress Procedure:  The patient received IV Lexiscan 0.4 mg over 15-seconds.  Technetium 21m Sestamibi injected  at 30-seconds.  The patient experienced marked SOB; 75 mg of IV Aminophylline was administered with resolution of symptoms.  There were no significant changes with Lexiscan.  Quantitative spect images were obtained after a 45 minute delay.  Transient Ischemic Dilatation (Normal <1.22):  0.91 Lung/Heart Ratio (Normal <0.45):  0.18 QGS EDV:  82 ml QGS ESV:  26 ml LV Ejection Fraction: 69%  Rest ECG: NSR - Normal EKG  Stress ECG: No significant change from baseline ECG  QPS Raw Data Images:  Normal; no motion artifact; normal heart/lung ratio. Stress Images:  Moderate sized anteroapical, apical lateral defect Rest Images:  Subtle apical defect Subtraction (SDS):  SDS 8 - Extent 12%  Impression Exercise Capacity:  Lexiscan with no exercise. BP Response:  Normal blood pressure response. Clinical Symptoms:  There is dyspnea. ECG Impression:  No significant ECG changes with Lexiscan. Comparison with Prior Nuclear Study: No previous nuclear study performed  Overall Impression:  Intermediate risk stress nuclear study with reversible distal anteroapical and anterolateral ischemia.  LV Wall Motion:  NL LV Function; NL Wall Motion; EF 69%  Pixie Casino, MD, [place] Certified in Nuclear Cardiology Attending Cardiologist [place], MD  01/31/2014 4:31 PM

## 2014-02-04 ENCOUNTER — Ambulatory Visit (INDEPENDENT_AMBULATORY_CARE_PROVIDER_SITE_OTHER): Payer: Medicare Other | Admitting: Internal Medicine

## 2014-02-04 ENCOUNTER — Encounter: Payer: Self-pay | Admitting: Internal Medicine

## 2014-02-04 VITALS — BP 112/64 | HR 56 | Ht 64.0 in | Wt 122.4 lb

## 2014-02-04 DIAGNOSIS — Z01818 Encounter for other preprocedural examination: Secondary | ICD-10-CM | POA: Diagnosis not present

## 2014-02-04 DIAGNOSIS — R5381 Other malaise: Secondary | ICD-10-CM | POA: Diagnosis not present

## 2014-02-04 DIAGNOSIS — R0789 Other chest pain: Secondary | ICD-10-CM

## 2014-02-04 DIAGNOSIS — R0602 Shortness of breath: Secondary | ICD-10-CM

## 2014-02-04 DIAGNOSIS — R9439 Abnormal result of other cardiovascular function study: Secondary | ICD-10-CM

## 2014-02-04 DIAGNOSIS — D689 Coagulation defect, unspecified: Secondary | ICD-10-CM | POA: Diagnosis not present

## 2014-02-04 DIAGNOSIS — R5383 Other fatigue: Secondary | ICD-10-CM | POA: Diagnosis not present

## 2014-02-04 DIAGNOSIS — I10 Essential (primary) hypertension: Secondary | ICD-10-CM

## 2014-02-04 NOTE — Progress Notes (Signed)
OFFICE NOTE  Chief Complaint:  Uncontrolled HTN, chest tightness  Primary Care Physician: Florina Ou, MD  HPI:  Kelsey James is a pleasant 73 year old female with little past medical history. For years she's had very low blood pressure however recently her blood pressure has spiked up into the upper 180's to over 287 systolic. She recently presented to the emergency room and any pain with symptoms such as throbbing in her neck and head with uncontrolled blood pressure. Subsequently she was started on low-dose amlodipine and she's had a nice improvement in her blood pressure. She does however report lower extremity swelling in her feet. She also describes what I believe is chest tightness. She actually says that she feels that her chest is "caving in" or "deflating". There is some associated sporadic shortness of breath but it has improved with her blood pressure medicine. She does do some activity and exercise and reports that her symptoms improve with exercise.  Kelsey James returns today for followup of her recent nuclear stress test. This study demonstrated reversible distal anteroapical and anterolateral reversible ischemia. EF of 69%.  I interpreted as intermediate risk due to the distal location of the ischemia however is abnormal. She is relatively thin and I did not suspect this is due to attenuation artifact.  PMHx:  Past Medical History  Diagnosis Date  . Glaucoma   . Rosacea   . Dry eyes   . Arthritis   . Heart murmur     Past Surgical History  Procedure Laterality Date  . Tonsillectomy and adenoidectomy  age 69    FAMHx:  Family History  Problem Relation Age of Onset  . Valvular heart disease Mother   . Cancer Maternal Grandmother   . Depression Father     SOCHx:   reports that she quit smoking about 11 years ago. Her smoking use included Cigarettes. She has a 12.5 pack-year smoking history. She has never used smokeless tobacco. She reports  that she drinks about 4.2 ounces of alcohol per week. She reports that she does not use illicit drugs.  ALLERGIES:  Allergies  Allergen Reactions  . Other Nausea And Vomiting    Mushrooms   . Shellfish Allergy Nausea And Vomiting    ROS: A comprehensive review of systems was negative except for: Cardiovascular: positive for chest pressure/discomfort and dyspnea  HOME MEDS: Current Outpatient Prescriptions  Medication Sig Dispense Refill  . amLODipine (NORVASC) 5 MG tablet Take 1 tablet by mouth daily.      Marland Kitchen azelastine (ASTELIN) 137 MCG/SPRAY nasal spray Place 1 spray into both nostrils 2 (two) times daily. Use in each nostril as directed      . b complex vitamins capsule Take 1 capsule by mouth daily.      Marland Kitchen BIOTIN PO Take 1 capsule by mouth daily.      . Calcium-Magnesium (CALCIUM MAGNESIUM 750) 300-300 MG TABS Take 1 tablet by mouth 3 (three) times daily.      . cycloSPORINE (RESTASIS) 0.05 % ophthalmic emulsion Place 1 drop into both eyes 2 (two) times daily.      . fluticasone (CUTIVATE) 0.05 % cream Apply 1 application topically daily as needed.      . hydrocortisone cream 1 % Apply 1 application topically daily as needed for itching.      . hydroxypropyl methylcellulose (ISOPTO TEARS) 2.5 % ophthalmic solution Place 1 drop into both eyes  as needed for dry eyes.      Marland Kitchen LECITHIN PO Take 1 capsule by mouth daily.      . LUTEIN PO Take 1 capsule by mouth daily.      Marland Kitchen MAGNESIUM PO Take by mouth daily.      . NON FORMULARY Apply 1 drop to eye 4 (four) times daily. [facility] - natural tear solution made of patient's blood/protein      . nystatin ointment (MYCOSTATIN) as needed.      . Omega-3 Fatty Acids (FISH OIL PO) Take 4 capsules by mouth daily.      Marland Kitchen OVER THE COUNTER MEDICATION Take 2 capsules by mouth daily. Vegetal Silica      . OVER THE COUNTER MEDICATION Take 1 capsule by mouth daily. Perfect Eyes supplement      . timolol (TIMOPTIC) 0.5 % ophthalmic solution Place 1  drop into both eyes 2 (two) times daily.      . TURMERIC PO Take 1 capsule by mouth daily.       No current facility-administered medications for this visit.    LABS/IMAGING: No results found for this or any previous visit (from the past 48 hour(s)). No results found.  VITALS: BP 112/64  Pulse 56  Ht 5\' 4"  (1.626 m)  Wt 122 lb 6.4 oz (55.52 kg)  BMI 21.00 kg/m2  EXAM: deferred  EKG: deferred  ASSESSMENT: 1. Malignant hypertension 2. Chest pressure - abnormal nuclear stress test 3. Bradycardia 4. Dyspnea and exertion  PLAN: 1.   Kelsey James continues to have problems with labile blood pressures and symptoms of chest pressure and needing to take deep breaths.  Her stress test suggested partially reversible anterior and anterolateral ischemia.  These and these findings I recommend a cardiac catheterization. I discussed risks and benefits at length with her today and she provided informed consent for the procedure.  We'll likely plan a radial approach. If her coronary imaging does not show any significant obstruction, then I would recommend outpatient renal Doppler's.  Thank you again for the kind referral. I will keep you informed of the results of her left heart catheterization.  Pixie Casino, MD, [facility] Attending Cardiologist [facility]  Amerigo Mcglory C 02/04/2014, 5:18 PM

## 2014-02-04 NOTE — Patient Instructions (Signed)
Your physician has requested that you have a cardiac catheterization. Cardiac catheterization is used to diagnose and/or treat various heart conditions. Doctors may recommend this procedure for a number of different reasons. The most common reason is to evaluate chest pain. Chest pain can be a symptom of coronary artery disease (CAD), and cardiac catheterization can show whether plaque is narrowing or blocking your heart's arteries. This procedure is also used to evaluate the valves, as well as measure the blood flow and oxygen levels in different parts of your heart. For further information please visit [email or web address]. Please follow instruction sheet, as given.  You will need to have blood work 3-5 days prior to this procedure.  Please go to [address] same building as Dr. Lysbeth Penner office (1st floor, 1st hallway on right) You do not need an appointment

## 2014-02-06 DIAGNOSIS — H31019 Macula scars of posterior pole (postinflammatory) (post-traumatic), unspecified eye: Secondary | ICD-10-CM | POA: Diagnosis not present

## 2014-02-06 DIAGNOSIS — H32 Chorioretinal disorders in diseases classified elsewhere: Secondary | ICD-10-CM | POA: Diagnosis not present

## 2014-02-06 DIAGNOSIS — H4050X0 Glaucoma secondary to other eye disorders, unspecified eye, stage unspecified: Secondary | ICD-10-CM | POA: Diagnosis not present

## 2014-02-06 DIAGNOSIS — H04129 Dry eye syndrome of unspecified lacrimal gland: Secondary | ICD-10-CM | POA: Diagnosis not present

## 2014-02-06 DIAGNOSIS — B399 Histoplasmosis, unspecified: Secondary | ICD-10-CM | POA: Diagnosis not present

## 2014-02-06 DIAGNOSIS — H35359 Cystoid macular degeneration, unspecified eye: Secondary | ICD-10-CM | POA: Diagnosis not present

## 2014-02-06 DIAGNOSIS — H40049 Steroid responder, unspecified eye: Secondary | ICD-10-CM | POA: Diagnosis not present

## 2014-02-06 DIAGNOSIS — H409 Unspecified glaucoma: Secondary | ICD-10-CM | POA: Diagnosis not present

## 2014-02-26 DIAGNOSIS — R0602 Shortness of breath: Secondary | ICD-10-CM | POA: Diagnosis not present

## 2014-02-26 DIAGNOSIS — M899 Disorder of bone, unspecified: Secondary | ICD-10-CM | POA: Diagnosis not present

## 2014-02-26 DIAGNOSIS — M949 Disorder of cartilage, unspecified: Secondary | ICD-10-CM | POA: Diagnosis not present

## 2014-02-26 DIAGNOSIS — R29890 Loss of height: Secondary | ICD-10-CM | POA: Diagnosis not present

## 2014-02-26 DIAGNOSIS — G930 Cerebral cysts: Secondary | ICD-10-CM | POA: Diagnosis not present

## 2014-02-28 ENCOUNTER — Encounter (HOSPITAL_COMMUNITY): Payer: Self-pay

## 2014-03-05 ENCOUNTER — Other Ambulatory Visit: Payer: Self-pay | Admitting: Internal Medicine

## 2014-03-05 ENCOUNTER — Ambulatory Visit
Admission: RE | Admit: 2014-03-05 | Discharge: 2014-03-05 | Disposition: A | Discharge status: Home / Self Care | Payer: Medicare Other | Source: Ambulatory Visit | Attending: Internal Medicine | Admitting: Internal Medicine

## 2014-03-05 DIAGNOSIS — R5381 Other malaise: Secondary | ICD-10-CM | POA: Diagnosis not present

## 2014-03-05 DIAGNOSIS — R5383 Other fatigue: Secondary | ICD-10-CM | POA: Diagnosis not present

## 2014-03-05 DIAGNOSIS — I2510 Atherosclerotic heart disease of native coronary artery without angina pectoris: Secondary | ICD-10-CM | POA: Diagnosis not present

## 2014-03-05 DIAGNOSIS — D689 Coagulation defect, unspecified: Secondary | ICD-10-CM | POA: Diagnosis not present

## 2014-03-05 DIAGNOSIS — Z01811 Encounter for preprocedural respiratory examination: Secondary | ICD-10-CM

## 2014-03-05 DIAGNOSIS — Z01818 Encounter for other preprocedural examination: Secondary | ICD-10-CM | POA: Diagnosis not present

## 2014-03-06 LAB — COMPREHENSIVE METABOLIC PANEL
ALT: 20 U/L (ref 0–35)
AST: 21 U/L (ref 0–37)
Albumin: 4.4 g/dL (ref 3.5–5.2)
Alkaline Phosphatase: 40 U/L (ref 39–117)
BILIRUBIN TOTAL: 0.6 mg/dL (ref 0.2–1.2)
BUN: 8 mg/dL (ref 6–23)
CO2: 32 mEq/L (ref 19–32)
CREATININE: 0.48 mg/dL — AB (ref 0.50–1.10)
Calcium: 9.4 mg/dL (ref 8.4–10.5)
Chloride: 103 mEq/L (ref 96–112)
GLUCOSE: 79 mg/dL (ref 70–99)
Potassium: 3.9 mEq/L (ref 3.5–5.3)
SODIUM: 141 meq/L (ref 135–145)
Total Protein: 6.6 g/dL (ref 6.0–8.3)

## 2014-03-06 LAB — CBC
HEMATOCRIT: 39.7 % (ref 36.0–46.0)
HEMOGLOBIN: 13.5 g/dL (ref 12.0–15.0)
MCH: 30.7 pg (ref 26.0–34.0)
MCHC: 34 g/dL (ref 30.0–36.0)
MCV: 90.2 fL (ref 78.0–100.0)
Platelets: 279 10*3/uL (ref 150–400)
RBC: 4.4 MIL/uL (ref 3.87–5.11)
RDW: 14.9 % (ref 11.5–15.5)
WBC: 7 10*3/uL (ref 4.0–10.5)

## 2014-03-06 LAB — PROTIME-INR
INR: 0.96 (ref <1.50)
Prothrombin Time: 12.7 seconds (ref 11.6–15.2)

## 2014-03-06 LAB — TSH: TSH: 1.187 u[IU]/mL (ref 0.350–4.500)

## 2014-03-06 LAB — APTT: aPTT: 29 seconds (ref 24–37)

## 2014-03-11 ENCOUNTER — Other Ambulatory Visit: Payer: Self-pay | Admitting: *Deleted

## 2014-03-11 DIAGNOSIS — H02409 Unspecified ptosis of unspecified eyelid: Secondary | ICD-10-CM | POA: Diagnosis not present

## 2014-03-11 DIAGNOSIS — H5710 Ocular pain, unspecified eye: Secondary | ICD-10-CM | POA: Diagnosis not present

## 2014-03-11 DIAGNOSIS — Z01818 Encounter for other preprocedural examination: Secondary | ICD-10-CM

## 2014-03-12 ENCOUNTER — Other Ambulatory Visit: Payer: Self-pay

## 2014-03-12 ENCOUNTER — Encounter (HOSPITAL_COMMUNITY)
Admission: RE | Disposition: A | Discharge status: Home / Self Care | Payer: Self-pay | Source: Ambulatory Visit | Attending: Internal Medicine

## 2014-03-12 ENCOUNTER — Ambulatory Visit (HOSPITAL_COMMUNITY)
Admission: RE | Admit: 2014-03-12 | Discharge: 2014-03-12 | Disposition: A | Discharge status: Home / Self Care | Payer: Medicare Other | Source: Ambulatory Visit | Attending: Internal Medicine | Admitting: Internal Medicine

## 2014-03-12 DIAGNOSIS — Z01818 Encounter for other preprocedural examination: Secondary | ICD-10-CM

## 2014-03-12 DIAGNOSIS — R0602 Shortness of breath: Secondary | ICD-10-CM | POA: Insufficient documentation

## 2014-03-12 DIAGNOSIS — R079 Chest pain, unspecified: Secondary | ICD-10-CM | POA: Diagnosis not present

## 2014-03-12 DIAGNOSIS — M7989 Other specified soft tissue disorders: Secondary | ICD-10-CM | POA: Insufficient documentation

## 2014-03-12 DIAGNOSIS — R9439 Abnormal result of other cardiovascular function study: Secondary | ICD-10-CM | POA: Diagnosis not present

## 2014-03-12 DIAGNOSIS — R0789 Other chest pain: Secondary | ICD-10-CM

## 2014-03-12 HISTORY — PX: LEFT HEART CATHETERIZATION WITH CORONARY ANGIOGRAM: SHX5451

## 2014-03-12 SURGERY — LEFT HEART CATHETERIZATION WITH CORONARY ANGIOGRAM
Anesthesia: LOCAL

## 2014-03-12 MED ORDER — ASPIRIN 81 MG PO CHEW
81.0000 mg | CHEWABLE_TABLET | ORAL | Status: AC
  Administered 2014-03-12: 81 mg via ORAL
  Filled 2014-03-12: qty 1

## 2014-03-12 MED ORDER — FENTANYL CITRATE 0.05 MG/ML IJ SOLN
INTRAMUSCULAR | Status: AC
  Filled 2014-03-12: qty 2

## 2014-03-12 MED ORDER — SODIUM CHLORIDE 0.9 % IV SOLN
INTRAVENOUS | Status: DC
  Administered 2014-03-12: 07:00:00 via INTRAVENOUS

## 2014-03-12 MED ORDER — MIDAZOLAM HCL 2 MG/2ML IJ SOLN
INTRAMUSCULAR | Status: AC
  Filled 2014-03-12: qty 2

## 2014-03-12 MED ORDER — LIDOCAINE HCL (PF) 1 % IJ SOLN
INTRAMUSCULAR | Status: AC
  Filled 2014-03-12: qty 30

## 2014-03-12 MED ORDER — SODIUM CHLORIDE 0.9 % IJ SOLN
3.0000 mL | INTRAMUSCULAR | Status: DC | PRN
Start: 2014-03-12 — End: 2014-03-12

## 2014-03-12 MED ORDER — VERAPAMIL HCL 2.5 MG/ML IV SOLN
INTRAVENOUS | Status: AC
  Filled 2014-03-12: qty 2

## 2014-03-12 MED ORDER — HEPARIN (PORCINE) IN NACL 2-0.9 UNIT/ML-% IJ SOLN
INTRAMUSCULAR | Status: AC
  Filled 2014-03-12: qty 1000

## 2014-03-12 MED ORDER — NITROGLYCERIN 0.2 MG/ML ON CALL CATH LAB
INTRAVENOUS | Status: AC
  Filled 2014-03-12: qty 1

## 2014-03-12 MED ORDER — SODIUM CHLORIDE 0.9 % IV SOLN: 1.0000 mL/kg/h | INTRAVENOUS | Status: AC

## 2014-03-12 MED ORDER — DIAZEPAM 5 MG PO TABS
5.0000 mg | ORAL_TABLET | ORAL | Status: AC
  Administered 2014-03-12: 5 mg via ORAL
  Filled 2014-03-12: qty 1

## 2014-03-12 NOTE — Discharge Instructions (Signed)
Angiography, Care After Refer to this sheet in the next few weeks. These instructions provide you with information on caring for yourself after your procedure. Your health care provider may also give you more specific instructions. Your treatment has been planned according to current medical practices, but problems sometimes occur. Call your health care provider if you have any problems or questions after your procedure.  WHAT TO EXPECT AFTER THE PROCEDURE After your procedure, it is typical to have the following sensations:  Minor discomfort or tenderness and a small bump at the catheter insertion site. The bump should usually decrease in size and tenderness within 1 to 2 weeks.  Any bruising will usually fade within 2 to 4 weeks. HOME CARE INSTRUCTIONS   You may need to keep taking blood thinners if they were prescribed for you. Only take over-the-counter or prescription medicines for pain, fever, or discomfort as directed by your health care provider.  Do not apply powder or lotion to the site.  Do not sit in a bathtub, swimming pool, or whirlpool for 5 to 7 days.  You may shower 24 hours after the procedure. Remove the bandage (dressing) and gently wash the site with plain soap and water. Gently pat the site dry.  Inspect the site at least twice daily.  Limit your activity for the first 48 hours. Do not bend, squat, or lift anything over 20 lb (9 kg) or as directed by your health care provider.  Do not drive home if you are discharged the day of the procedure. Have someone else drive you. Follow instructions about when you can drive or return to work. SEEK MEDICAL CARE IF:  You get lightheaded when standing up.  You have drainage (other than a small amount of blood on the dressing).  You have chills.  You have a fever.  You have redness, warmth, swelling, or pain at the insertion site. SEEK IMMEDIATE MEDICAL CARE IF:   You develop chest pain or shortness of breath, feel faint,  or pass out.  You have bleeding, swelling larger than a walnut, or drainage from the catheter insertion site.  You develop pain, discoloration, coldness, or severe bruising in the leg or arm that held the catheter.  You develop bleeding from any other place, such as the bowels. You may see bright red blood in your urine or stools, or your stools may appear black and tarry.  You have heavy bleeding from the site. If this happens, hold pressure on the site. MAKE SURE YOU:  Understand these instructions.  Will watch your condition.  Will get help right away if you are not doing well or get worse. Document Released: 05/19/2005 Document Revised: 07/03/2013 Document Reviewed: 03/25/2013 [facility] Patient Information 2014 [place]. Radial Site Care Refer to this sheet in the next few weeks. These instructions provide you with information on caring for yourself after your procedure. Your caregiver may also give you more specific instructions. Your treatment has been planned according to current medical practices, but problems sometimes occur. Call your caregiver if you have any problems or questions after your procedure. HOME CARE INSTRUCTIONS  You may shower the day after the procedure.Remove the bandage (dressing) and gently wash the site with plain soap and water.Gently pat the site dry.  Do not apply powder or lotion to the site.  Do not submerge the affected site in water for 3 to 5 days.  Inspect the site at least twice daily.  Do not flex or bend the affected  arm for 24 hours.  No lifting over 5 pounds (2.3 kg) for 5 days after your procedure.  Do not drive home if you are discharged the same day of the procedure. Have someone else drive you.  You may drive 24 hours after the procedure unless otherwise instructed by your caregiver.  Do not operate machinery or power tools for 24 hours.  A responsible adult should be with you for the first 24 hours after you arrive  home. What to expect:  Any bruising will usually fade within 1 to 2 weeks.  Blood that collects in the tissue (hematoma) may be painful to the touch. It should usually decrease in size and tenderness within 1 to 2 weeks. SEEK IMMEDIATE MEDICAL CARE IF:  You have unusual pain at the radial site.  You have redness, warmth, swelling, or pain at the radial site.  You have drainage (other than a small amount of blood on the dressing).  You have chills.  You have a fever or persistent symptoms for more than 72 hours.  You have a fever and your symptoms suddenly get worse.  Your arm becomes pale, cool, tingly, or numb.  You have heavy bleeding from the site. Hold pressure on the site. Document Released: 12/03/2010 Document Revised: 01/23/2012 Document Reviewed: 12/03/2010 [facility] Patient Information 2014 [place], [place].

## 2014-03-12 NOTE — CV Procedure (Signed)
CARDIAC CATHETERIZATION REPORT  Kelsey James   [id number] 07/04/1941  Performing Cardiologist: Kelsey James Primary Physician: Kelsey Ou, MD Primary Cardiologist:  Dr. Debara James  Procedures Performed:  Left Heart Catheterization via 5 Fr right femoral artery access  Left Ventriculography, (RAO/LAO) 12 ml/sec for 25 ml total contrast  Native Coronary Angiography  Internal Mammary Graft Angiography  Saphenous Vein Graft / Free Radial Artery Graft Angiography  Indication(s): chest pain  Pre-Procedural Diagnosis(es):  1. Chest pain 2. Abnormal nuclear stress test  Post-Procedural Diagnosis(es): 1. Normal coronaries  Pre-Procedural Non-invasive testing: Consistent with viable (ischemic) myocardium.  History: 73 y.o. female presented with little past medical history. For years she's had very low blood pressure however recently her blood pressure has spiked up into the upper 180's to over 347 systolic. She recently presented to the emergency room and any pain with symptoms such as throbbing in her neck and head with uncontrolled blood pressure. Subsequently she was started on low-dose amlodipine and she's had a nice improvement in her blood pressure. She does however report lower extremity swelling in her feet. She also describes what I believe is chest tightness. She actually says that she feels that her chest is "caving in" or "deflating". There is some associated sporadic shortness of breath but it has improved with her blood pressure medicine. She does do some activity and exercise and reports that her symptoms improve with exercise. Kelsey James returns today for followup of her recent nuclear stress test. This study demonstrated reversible distal anteroapical and anterolateral reversible ischemia. EF of 69%. I interpreted as intermediate risk due to the distal location of the ischemia however is abnormal. She is relatively thin and I did not suspect this is due to attenuation  artifact.  Risks / Complications include, but not limited to: Death, MI, CVA/TIA, VF/VT (with defibrillation), Bradycardia (need for temporary pacer placement), contrast induced nephropathy, bleeding / bruising / hematoma / pseudoaneurysm, vascular or coronary injury (with possible emergent CT or Vascular Surgery), adverse medication reactions, infection.    Consent: Risks of procedure as well as the alternatives and risks of each were explained to the (patient/caregiver).  Consent for procedure obtained.  Procedure: The patient was brought to the [facility] in the fasting state and prepped and draped in the usual sterile fashion for (Right groin or radial) access. A modified Allen's test with plethysmography was performed on the right wrist demonstrating adequate Ulnar Artery collateral flow.    Time Out: Verified patient identification, verified procedure, site/side was marked, verified correct patient position, special equipment/implants available, radiation safety measures in place (including badges and shielding), medications/allergies/relevent history reviewed, required imaging and test results available.  Performed  Procedure: Initial attempt at radial catheterization was unsuccessful - the right radial artery was punctured with the needle, but a wire could not be threaded. The vessel was not dilated. A TR band was placed at 8 cc of air.  Then attention was turned to the femoral artery. The right femoral head was identified using tactile and fluoroscopic technique.  The right groin was anesthetized with 1% subcutaneous Lidocaine.  The right Common Femoral Artery was accessed using the Modified Seldinger Technique with placement of (5 Fr) sheath using the Seldinger technique.  The sheath was aspirated and flushed.  A 5 Fr JL4 Catheter was advanced of over a Standard J wire into the ascending Aorta.  The catheter was used to engage the left coronary artery.   Multiple cineangiographic  views of the left coronary artery system(s) were performed. A 5 Fr JR4 Catheter was advanced of over a Safety J wire into the ascending Aorta.  The catheter was used to engage the right coronary artery.  Multiple cineangiographic views of the right coronary artery system(s) were performed. This catheter was then exchanged over the Standard J wire for an angled Pigtail catheter that was advanced across the Aortic Valve.  LV hemodynamics were measured (and Left Ventriculography was performed).  LV hemodynamics were then re-sampled, and the catheter was pulled back across the Aortic Valve for measurement of "pull-back" gradient.  The catheter and the wire was removed completely out of the body. The patient was transferred to the holding area where the sheath was removed with manual pressure held for hemostasis.   Recovery: The patient was transported to the cath lab holding area in stable condition.   The patient  was stable before, during and following the procedure.   Patient did not tolerate procedure well. There were not complications.  EBL: Minimal  Medications:  Premedication: none  Sedation:  1 mg IV Versed, 25 mcg IV Fentanyl  Contrast:  50 ml Omnipaque  Local Anesthesia: 3 cc 1% lidocaine   Hemodynamics:  Central Aortic Pressure / Mean Aortic Pressure: 103/51  LV Pressure / LV End diastolic Pressure:  5  Left Ventriculography:  EF:  60%  Wall Motion: normal  MR: 0  Coronary Angiographic Data:  Left Main:  Long straight LM coronary, bifurcates in the usual fashion to the LAD and LCX. Coarses around the apex.  Left Anterior Descending (LAD):  Angiographically normal coronary.  Circumflex (LCx):  Angiographically normal.  1st obtuse marginal:  Normal.  Right Coronary Artery: Dominant. Angiographically normal.  posterior descending artery: No stenosis.  posterior lateral branch:  No stenosis.  Impression: 1.  Angiographically normal coronaries. 2.   LVEF 60%, no wall motion abnormalities. 3.  LVEDP =  5 mmHg  Plan: 1.  Bedrest for 4 hours and then ok to d/c home. We will contact her for follow-up. 2.  She has had bradycardia recently .Marland Kitchen I will review her medications and stop offending agents. This could be causing her symptoms - will arrange a 2 week monitor. 3.  She should check with her ophthalmologist about possibly stopping timolol which can lower HR.  The case and results was discussed with the patient and family if available.  The case and results was not discussed with the patient's PCP. The case and results was discussed with the patient's Cardiologist.  Time Spent Directly with the Patient:  60 minutes  Kelsey Casino, MD, [facility] Attending Cardiologist [place] 03/12/2014, 9:36 AM

## 2014-03-12 NOTE — H&P (Addendum)
     INTERVAL PROCEDURE H&P  History and Physical Interval Note:  03/12/2014 7:13 AM  Kelsey James has presented today for their planned procedure. The various methods of treatment have been discussed with the patient and family. After consideration of risks, benefits and other options for treatment, the patient has consented to the procedure.  The patients' outpatient history has been reviewed, patient examined, and no change in status from most recent office note within the past 30 days. I have reviewed the patients' chart and labs and will proceed as planned. Questions were answered to the patient's satisfaction.   Cath Lab Visit (complete for each Cath Lab visit)  Clinical Evaluation Leading to the Procedure:   ACS: no  Non-ACS:    Anginal Classification: CCS III  Anti-ischemic medical therapy: Maximal Therapy (2 or more classes of medications)  Non-Invasive Test Results: Intermediate-risk stress test findings: cardiac mortality 1-3%/year  Prior CABG: No previous CABG  Pixie Casino, MD, [facility] Attending Cardiologist [place] 03/12/2014, 7:13 AM

## 2014-03-13 ENCOUNTER — Telehealth: Payer: Self-pay | Admitting: *Deleted

## 2014-03-13 DIAGNOSIS — R001 Bradycardia, unspecified: Secondary | ICD-10-CM

## 2014-03-13 NOTE — Telephone Encounter (Signed)
Patient notified Kelsey James will need to wear 2 week event monitor for bradycardia and should f/up with Dr. Debara Pickett after Kelsey James asked if her valves were ok and if a renal doppler is necessary. Will defer to Dr. Debara Pickett

## 2014-03-14 ENCOUNTER — Telehealth: Payer: Self-pay | Admitting: Internal Medicine

## 2014-03-14 NOTE — Telephone Encounter (Signed)
Closed encounter °

## 2014-03-19 DIAGNOSIS — G930 Cerebral cysts: Secondary | ICD-10-CM | POA: Diagnosis not present

## 2014-03-28 ENCOUNTER — Encounter: Payer: Medicare Other | Admitting: *Deleted

## 2014-03-28 DIAGNOSIS — R002 Palpitations: Secondary | ICD-10-CM

## 2014-03-28 DIAGNOSIS — R001 Bradycardia, unspecified: Secondary | ICD-10-CM

## 2014-04-08 DIAGNOSIS — S30860A Insect bite (nonvenomous) of lower back and pelvis, initial encounter: Secondary | ICD-10-CM | POA: Diagnosis not present

## 2014-04-14 ENCOUNTER — Ambulatory Visit: Payer: Medicare Other | Admitting: Internal Medicine

## 2014-05-07 DIAGNOSIS — H2510 Age-related nuclear cataract, unspecified eye: Secondary | ICD-10-CM | POA: Diagnosis not present

## 2014-05-07 DIAGNOSIS — H40049 Steroid responder, unspecified eye: Secondary | ICD-10-CM | POA: Diagnosis not present

## 2014-05-07 DIAGNOSIS — H409 Unspecified glaucoma: Secondary | ICD-10-CM | POA: Diagnosis not present

## 2014-05-07 DIAGNOSIS — H4050X0 Glaucoma secondary to other eye disorders, unspecified eye, stage unspecified: Secondary | ICD-10-CM | POA: Diagnosis not present

## 2014-05-13 ENCOUNTER — Encounter: Payer: Self-pay | Admitting: Internal Medicine

## 2014-05-13 ENCOUNTER — Ambulatory Visit (INDEPENDENT_AMBULATORY_CARE_PROVIDER_SITE_OTHER): Payer: Medicare Other | Admitting: Internal Medicine

## 2014-05-13 VITALS — BP 110/70 | HR 46 | Ht 64.0 in | Wt 120.8 lb

## 2014-05-13 DIAGNOSIS — R002 Palpitations: Secondary | ICD-10-CM

## 2014-05-13 DIAGNOSIS — I10 Essential (primary) hypertension: Secondary | ICD-10-CM | POA: Diagnosis not present

## 2014-05-13 NOTE — Progress Notes (Signed)
OFFICE NOTE  Chief Complaint:  F/U monitor  Primary Care Physician: Florina Ou, MD  HPI:  Kelsey James is a pleasant 73 year old female with little past medical history. For years she's had very low blood pressure however recently her blood pressure has spiked up into the upper 180's to over 891 systolic. She recently presented to the emergency room and any pain with symptoms such as throbbing in her neck and head with uncontrolled blood pressure. Subsequently she was started on low-dose amlodipine and she's had a nice improvement in her blood pressure. She does however report lower extremity swelling in her feet. She also describes what I believe is chest tightness. She actually says that she feels that her chest is "caving in" or "deflating". There is some associated sporadic shortness of breath but it has improved with her blood pressure medicine. She does do some activity and exercise and reports that her symptoms improve with exercise.  Kelsey James returns today for followup of her recent nuclear stress test. This study demonstrated reversible distal anteroapical and anterolateral reversible ischemia. EF of 69%.  I interpreted as intermediate risk due to the distal location of the ischemia however is abnormal. She is relatively thin and I did not suspect this is due to attenuation artifact.  Ultimately she was referred for cardiac catheterization.  This was performed by myself and demonstrated normal coronary arteries. Subsequently she been having palpitations and I arranged for a monitor. She wore that monitor her for 2 weeks between 03/28/2014 and 04/11/2014. Total monitoring time was 288 hours and 50 minutes. Lowest heart rate was 41 and average heart rate was 59. She was noted to have PACs, short runs of PSVT up to 14 beats were also noted.  This likely explains her palpitations.  PMHx:  Past Medical History  Diagnosis Date  . Glaucoma   . Rosacea   . Dry eyes     . Arthritis   . Heart murmur     Past Surgical History  Procedure Laterality Date  . Tonsillectomy and adenoidectomy  age 30    FAMHx:  Family History  Problem Relation Age of Onset  . Valvular heart disease Mother   . Cancer Maternal Grandmother   . Depression Father     SOCHx:   reports that she quit smoking about 11 years ago. Her smoking use included Cigarettes. She has a 12.5 pack-year smoking history. She has never used smokeless tobacco. She reports that she drinks about 4.2 ounces of alcohol per week. She reports that she does not use illicit drugs.  ALLERGIES:  Allergies  Allergen Reactions  . Other Nausea And Vomiting    Mushrooms   . Shellfish Allergy Nausea And Vomiting    ROS: A comprehensive review of systems was negative except for: Cardiovascular: positive for palpitations  HOME MEDS: Current Outpatient Prescriptions  Medication Sig Dispense Refill  . amLODipine (NORVASC) 5 MG tablet Take 1 tablet by mouth daily.      Marland Kitchen azelastine (ASTELIN) 137 MCG/SPRAY nasal spray Place 1 spray into both nostrils 2 (two) times daily. Use in each nostril as directed      . b complex vitamins capsule Take 1 capsule by mouth daily.      . Biotin 1000 MCG tablet Take 1,000 mcg by mouth daily.      . Calcium-Magnesium (CALCIUM MAGNESIUM 750) 300-300 MG TABS Take 2 tablets by mouth  2 (two) times daily. 2 tablets in the morning and 1 tab in the evening      . Cholecalciferol (VITAMIN D3 PO) Take 1 tablet by mouth daily.      . cycloSPORINE (RESTASIS) 0.05 % ophthalmic emulsion Place 1 drop into both eyes 2 (two) times daily.      . hydroxypropyl methylcellulose (ISOPTO TEARS) 2.5 % ophthalmic solution Place 1 drop into both eyes as needed for dry eyes.      Marland Kitchen LECITHIN PO Take 1 capsule by mouth daily.      . LUTEIN PO Take 1 capsule by mouth daily.      Marland Kitchen MAGNESIUM PO Take by mouth daily.      . Omega-3 Fatty Acids (FISH OIL PO) Take 2 capsules by mouth 2 (two) times daily.        Marland Kitchen OVER THE COUNTER MEDICATION Take 1 capsule by mouth 2 (two) times daily. Vegetal Silica      . OVER THE COUNTER MEDICATION Take 1 capsule by mouth 2 (two) times daily. Perfect Eyes supplement      . timolol (TIMOPTIC) 0.5 % ophthalmic solution Place 1 drop into both eyes 2 (two) times daily.       No current facility-administered medications for this visit.    LABS/IMAGING: No results found for this or any previous visit (from the past 48 hour(s)). No results found.  VITALS: BP 110/70  Pulse 46  Ht 5\' 4"  (1.626 m)  Wt 120 lb 12.8 oz (54.795 kg)  BMI 20.73 kg/m2  EXAM: deferred  EKG: SInus bradycardia at 46  ASSESSMENT: 1. Hypertension - BP now normalized on amlodipine 2. Chest pressure - normal coronary arteries by cardiac cath 3. Bradycardia 4. Dyspnea and exertion - improved 5. Palpitations  PLAN: 1.   [name] returns today for followup of her monitor. Amazingly her blood pressure is normal at 110/70. It has been significantly elevated higher than that in the past. 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 Hopefully being on low dose Norvasc is helping to keep her blood pressure down. She continues to have bradycardia and by monitoring she had PACs and runs of SVT which were short. Unfortunately, there are few options to treat her extrasystoles do to her bradycardia. We will need to continue to monitor her heart rate as there is concern for possible sinus node dysfunction. I did ask her to speak with her ophthalmologist again about possibly discontinuing timolol.   Will plan follow-up in 6 months.  Pixie Casino, MD, [facility] Attending Cardiologist [facility]  HILTY,Kenneth C 05/13/2014, 3:37 PM

## 2014-05-13 NOTE — Patient Instructions (Signed)
Your physician wants you to follow-up in:  6 months. You will receive a reminder letter in the mail two months in advance. If you don't receive a letter, please call our office to schedule the follow-up appointment.   

## 2014-05-23 DIAGNOSIS — H2510 Age-related nuclear cataract, unspecified eye: Secondary | ICD-10-CM | POA: Diagnosis not present

## 2014-05-23 DIAGNOSIS — H01009 Unspecified blepharitis unspecified eye, unspecified eyelid: Secondary | ICD-10-CM | POA: Diagnosis not present

## 2014-05-23 DIAGNOSIS — H02409 Unspecified ptosis of unspecified eyelid: Secondary | ICD-10-CM | POA: Diagnosis not present

## 2014-05-23 DIAGNOSIS — H04129 Dry eye syndrome of unspecified lacrimal gland: Secondary | ICD-10-CM | POA: Diagnosis not present

## 2014-05-29 ENCOUNTER — Telehealth: Payer: Self-pay | Admitting: Internal Medicine

## 2014-05-29 NOTE — Telephone Encounter (Signed)
Please call,concerning her low blood pressure. She is getting ready to go to the eye doctor and she wants to make sure she tell him the right thing that Dr Debara Pickett said.

## 2014-05-29 NOTE — Telephone Encounter (Signed)
Returned call to patient. She wanted to find out if her timolol may be associated with low HR or BP. RN looked up timolol and it appears it can cause a slow HR, which patient has had issues with (see last OV note and documentation regarding event monitor). She wanted to inform her eye doctor of this at her appointment today. RN advised her to write down her BP and HR readings from her BP cuff to take with her to this appointment as well if she is concerned about her numbers. Patient voiced understanding.

## 2014-06-03 ENCOUNTER — Ambulatory Visit (INDEPENDENT_AMBULATORY_CARE_PROVIDER_SITE_OTHER): Payer: Medicare Other | Admitting: Internal Medicine

## 2014-06-03 VITALS — BP 110/50 | HR 71 | Ht 64.0 in | Wt 120.7 lb

## 2014-06-03 DIAGNOSIS — I498 Other specified cardiac arrhythmias: Secondary | ICD-10-CM

## 2014-06-03 DIAGNOSIS — R002 Palpitations: Secondary | ICD-10-CM

## 2014-06-03 DIAGNOSIS — R0789 Other chest pain: Secondary | ICD-10-CM | POA: Diagnosis not present

## 2014-06-03 DIAGNOSIS — R001 Bradycardia, unspecified: Secondary | ICD-10-CM

## 2014-06-03 NOTE — Patient Instructions (Addendum)
You have been referred to an electrophysiologist. ([place] Location)  Monitor your heart rate and BP daily to take to this visit.

## 2014-06-03 NOTE — Progress Notes (Signed)
OFFICE NOTE  Chief Complaint:  Palpitations  Primary Care Physician: Florina Ou, MD  HPI:  Kelsey James is a pleasant 73 year old female with little past medical history. For years she's had very low blood pressure however recently her blood pressure has spiked up into the upper 180's to over 867 systolic. She recently presented to the emergency room and any pain with symptoms such as throbbing in her neck and head with uncontrolled blood pressure. Subsequently she was started on low-dose amlodipine and she's had a nice improvement in her blood pressure. She does however report lower extremity swelling in her feet. She also describes what I believe is chest tightness. She actually says that she feels that her chest is "caving in" or "deflating". There is some associated sporadic shortness of breath but it has improved with her blood pressure medicine. She does do some activity and exercise and reports that her symptoms improve with exercise.  Kelsey James had a recent nuclear stress test which demonstrated reversible distal anteroapical and anterolateral reversible ischemia. EF of 69%.  I interpreted as intermediate risk due to the distal location of the ischemia however is abnormal. She is relatively thin and I did not suspect this is due to attenuation artifact.  Ultimately she was referred for cardiac catheterization.  This was performed by myself and demonstrated normal coronary arteries. Subsequently she been having palpitations and I arranged for a monitor. She wore that monitor her for 2 weeks between 03/28/2014 and 04/11/2014. Total monitoring time was 288 hours and 50 minutes. Lowest heart rate was 41 and average heart rate was 59. She was noted to have PACs, short runs of PSVT up to 14 beats were also noted.  This likely explains her palpitations.  Given her bradycardia, I recommended discontinuing her timolol, which is the only medication that I can see which could  have been causing her bradycardia. She now presents today as a walk-in with feelings of her heart thumping. She was playing progressive bridge today and she started having heaviness in her chest. She felt like her heart was racing and she was short of breath, dizzy and like she was going to pass out. An EKG was performed in the office today which showed normal sinus rhythm at 71. Blood pressure was normal.  PMHx:  Past Medical History  Diagnosis Date  . Glaucoma   . Rosacea   . Dry eyes   . Arthritis   . Heart murmur     Past Surgical History  Procedure Laterality Date  . Tonsillectomy and adenoidectomy  age 28    FAMHx:  Family History  Problem Relation Age of Onset  . Valvular heart disease Mother   . Cancer Maternal Grandmother   . Depression Father     SOCHx:   reports that she quit smoking about 11 years ago. Her smoking use included Cigarettes. She has a 12.5 pack-year smoking history. She has never used smokeless tobacco. She reports that she drinks about 4.2 ounces of alcohol per week. She reports that she does not use illicit drugs.  ALLERGIES:  Allergies  Allergen Reactions  . Other Nausea And Vomiting    Mushrooms   . Shellfish Allergy Nausea And Vomiting    ROS: A comprehensive review of systems was negative except for: Cardiovascular: positive for palpitations  HOME MEDS: Current Outpatient Prescriptions  Medication Sig Dispense Refill  . amLODipine (NORVASC) 5  MG tablet Take 1 tablet by mouth daily.      Marland Kitchen b complex vitamins capsule Take 1 capsule by mouth daily.      . Biotin 1000 MCG tablet Take 1,000 mcg by mouth daily.      . Calcium-Magnesium (CALCIUM MAGNESIUM 750) 300-300 MG TABS Take 2 tablets by mouth 2 (two) times daily. 2 tablets in the morning and 1 tab in the evening      . Cholecalciferol (VITAMIN D3 PO) Take 1 tablet by mouth daily.      . cycloSPORINE (RESTASIS) 0.05 % ophthalmic emulsion Place 1 drop into both eyes 2 (two) times daily.       . hydroxypropyl methylcellulose (ISOPTO TEARS) 2.5 % ophthalmic solution Place 1 drop into both eyes as needed for dry eyes.      Marland Kitchen LECITHIN PO Take 1 capsule by mouth daily.      . LUTEIN PO Take 1 capsule by mouth daily.      Marland Kitchen MAGNESIUM PO Take by mouth daily.      . Omega-3 Fatty Acids (FISH OIL PO) Take 2 capsules by mouth 2 (two) times daily.       Marland Kitchen OVER THE COUNTER MEDICATION Take 1 capsule by mouth 2 (two) times daily. Vegetal Silica      . OVER THE COUNTER MEDICATION Take 1 capsule by mouth 2 (two) times daily. Perfect Eyes supplement       No current facility-administered medications for this visit.    LABS/IMAGING: No results found for this or any previous visit (from the past 48 hour(s)). No results found.  VITALS: BP 110/50  Pulse 71  Ht 5\' 4"  (1.626 m)  Wt 120 lb 11.2 oz (54.749 kg)  BMI 20.71 kg/m2  EXAM: deferred  EKG: SInus rhythm at 71  ASSESSMENT: 1. Hypertension - BP now normalized on amlodipine 2. Chest pressure - normal coronary arteries by cardiac cath 3. Bradycardia - improved off timolol 4. Dyspnea and exertion 5. Palpitations - worse 6. Dizziness -presyncope  PLAN: 1.   Kelsey James walked into the office today with urgent complaints of feeling dizzy, short of breath, presyncopal having palpitations. Although she presented with the symptoms she actually appears quite normal. Her blood pressure was normal and her heart rate had improved with a normal sinus rhythm on EKG. I not sure what the cause of her symptoms are however intermittent arrhythmias are possible. Her monitor did show extrasystoles as well as  short runs of PSVT up to 14 beats. Unfortunately she's been having bradycardia for which she is somewhat symptomatic. I recommended stopping her timolol eyedrops that she was bradycardic, and this has appeared to help her bradycardia however it may be unmasking more paroxysmal arrhythmias.  This may be difficult to control. I would therefore  recommend a referral to one of my partners in cardiac electrophysiology to evaluate options for treatment of her palpitations and arrhythmias.  She may be a good candidate for flecainide as it is associated with a low risk of bradycardia and may help suppress some of her atrial arrhythmias.  Potentially another option would be a pacemaker strategy due to her tachy/brady symptoms, but I would like to avoid that if possible.  Her heart rate does seem to be better off the timolol.  Plan to see her back after her EP appointment.  Pixie Casino, MD, [facility] Attending Cardiologist [facility]  Amilah Greenspan C 06/03/2014, 3:54 PM

## 2014-06-03 NOTE — Progress Notes (Signed)
Pt walked into the office today c/o heart thumping, high blood pressure and dizziness.  Saturday pm she took the last dose of timolol eye drops on Saturday night. Today while playing cards she developed palpitations, SOB and dizziness. She had these problems between 11am and 1:30pm today. She is here now and the above symptoms have resolved. Her vital signs are stable and the EKG shows sinus rhythm. Will discuss with dr Debara Pickett

## 2014-06-05 ENCOUNTER — Institutional Professional Consult (permissible substitution): Payer: Medicare Other | Admitting: Internal Medicine

## 2014-06-05 NOTE — Addendum Note (Signed)
Addended by: Cristopher Estimable on: 06/05/2014 07:54 AM   Modules accepted: Orders

## 2014-06-16 ENCOUNTER — Other Ambulatory Visit: Payer: Self-pay | Admitting: *Deleted

## 2014-06-16 DIAGNOSIS — H2510 Age-related nuclear cataract, unspecified eye: Secondary | ICD-10-CM | POA: Diagnosis not present

## 2014-06-16 DIAGNOSIS — H40049 Steroid responder, unspecified eye: Secondary | ICD-10-CM | POA: Diagnosis not present

## 2014-06-16 DIAGNOSIS — H04129 Dry eye syndrome of unspecified lacrimal gland: Secondary | ICD-10-CM | POA: Diagnosis not present

## 2014-06-16 DIAGNOSIS — H409 Unspecified glaucoma: Secondary | ICD-10-CM | POA: Diagnosis not present

## 2014-06-16 DIAGNOSIS — H4050X0 Glaucoma secondary to other eye disorders, unspecified eye, stage unspecified: Secondary | ICD-10-CM | POA: Diagnosis not present

## 2014-06-16 MED ORDER — AMLODIPINE BESYLATE 5 MG PO TABS: 5.0000 mg | ORAL_TABLET | Freq: Every day | ORAL | Status: DC

## 2014-06-16 NOTE — Telephone Encounter (Signed)
Rx was sent to pharmacy electronically. (amlodipine) Patient walked in requesting refill as PCP is out on medical leave

## 2014-07-09 ENCOUNTER — Encounter: Payer: Self-pay | Admitting: *Deleted

## 2014-07-10 ENCOUNTER — Encounter: Payer: Self-pay | Admitting: Internal Medicine

## 2014-07-10 ENCOUNTER — Ambulatory Visit (INDEPENDENT_AMBULATORY_CARE_PROVIDER_SITE_OTHER): Payer: Medicare Other | Admitting: Internal Medicine

## 2014-07-10 VITALS — BP 128/70 | HR 60 | Ht 64.0 in | Wt 120.4 lb

## 2014-07-10 DIAGNOSIS — R001 Bradycardia, unspecified: Secondary | ICD-10-CM

## 2014-07-10 DIAGNOSIS — I498 Other specified cardiac arrhythmias: Secondary | ICD-10-CM

## 2014-07-10 DIAGNOSIS — R002 Palpitations: Secondary | ICD-10-CM

## 2014-07-10 NOTE — Patient Instructions (Signed)
Your physician recommends that you continue on your current medications as directed. Please refer to the Current Medication list given to you today.  Your physician recommends that you schedule a follow-up appointment in: 8 weeks with Dr. Klein.  

## 2014-07-10 NOTE — Progress Notes (Signed)
ELECTROPHYSIOLOGY CONSULT NOTE  Patient ID: Kelsey James, MRN: [id number], DOB/AGE: Jun 12, 1941 73 y.o. Admit date: (Not on file) Date of Consult: 07/10/2014  Primary Physician: Florina Ou, MD Primary Cardiologist: [facility]  Chief Complaint: Palps   HPI Kelsey James is a 72 y.o. female  Referred for evaluation of palpitations.  She has 2 distinct syndromes. One is an irregular heartbeat that she's had for more than 20 years. It is increasingly frequent and increasing the notable but not particularly problematic. Event recorder undertaken by Dr. Debara Pickett demonstrated frequent symptomatic episodes of palpitations that were associated with PACs couplets and short runs.  Of more significant note however are spells that occur about once a month over the last year where she becomes profoundly short of breath lightheaded and diaphoretic this can last minutes. It is quite frightening. She did not have one of these episodes she was wearing the monitor. These are not associated with palpitations.    She was also noted to have significant episodes of sinus bradycardia with heart rates into the 40s. These were sometimes associated with skipped beats not withstanding no evidence of ectopy and chest pain.  Her cardiac evaluation has included a nuclear scan that demonstrated a reversible distal anteroapical and anterolateral ischemia and ejection fraction 59% she underwent catheterization which was normal. She also had an echocardiogram was normal.    She is the mother of 2 daughters. One of her twins died with her granddaughter car accident 2002.   Past Medical History  Diagnosis Date  . Glaucoma   . Rosacea   . Dry eyes   . Arthritis   . Heart murmur       Surgical History:  Past Surgical History  Procedure Laterality Date  . Tonsillectomy and adenoidectomy  age 54     Home Meds: Prior to Admission medications   Medication Sig Start Date End Date Taking? Authorizing Provider    amLODipine (NORVASC) 5 MG tablet Take 1 tablet (5 mg total) by mouth daily. 06/16/14  Yes Pixie Casino, MD  b complex vitamins capsule Take 1 capsule by mouth daily.   Yes Historical Provider, MD  Biotin 1000 MCG tablet Take 1,000 mcg by mouth daily.   Yes Historical Provider, MD  Calcium-Magnesium (CALCIUM MAGNESIUM 750) 300-300 MG TABS Take 2 tablets by mouth 2 (two) times daily.    Yes Historical Provider, MD  Cholecalciferol (VITAMIN D3 PO) Take 1 tablet by mouth daily.   Yes Historical Provider, MD  cycloSPORINE (RESTASIS) 0.05 % ophthalmic emulsion Place 1 drop into both eyes 2 (two) times daily.   Yes Historical Provider, MD  hydroxypropyl methylcellulose (ISOPTO TEARS) 2.5 % ophthalmic solution Place 1 drop into both eyes as needed for dry eyes.   Yes Historical Provider, MD  LECITHIN PO Take 1 capsule by mouth daily.   Yes Historical Provider, MD  LUTEIN PO Take 1 capsule by mouth daily.   Yes Historical Provider, MD  MAGNESIUM PO Take by mouth daily.   Yes Historical Provider, MD  Omega-3 Fatty Acids (FISH OIL PO) Take 2 capsules by mouth 2 (two) times daily.    Yes Historical Provider, MD  OVER THE COUNTER MEDICATION Take 1 capsule by mouth 2 (two) times daily. Vegetal Silica   Yes Historical Provider, MD  OVER THE COUNTER MEDICATION Take 1 capsule by mouth 2 (two) times daily. Perfect Eyes supplement   Yes Historical Provider, MD     Allergies:  Allergies  Allergen Reactions  .  Other Nausea And Vomiting    Mushrooms   . Shellfish Allergy Nausea And Vomiting    History   Social History  . Marital Status: Married    Spouse Name: N/A    Number of Children: N/A  . Years of Education: N/A   Occupational History  . Not on file.   Social History Main Topics  . Smoking status: Former Smoker -- 0.50 packs/day for 25 years    Types: Cigarettes    Quit date: 11/22/2002  . Smokeless tobacco: Never Used  . Alcohol Use: 4.2 oz/week    7 Glasses of wine per week  . Drug  Use: No  . Sexual Activity: Not on file   Other Topics Concern  . Not on file   Social History Narrative  . No narrative on file     Family History  Problem Relation Age of Onset  . Valvular heart disease Mother   . Cancer Maternal Grandmother   . Depression Father      ROS:  Please see the history of present illness.     All other systems reviewed and negative.    Physical Exam: Blood pressure 128/70, pulse 60, height 5\' 4"  (1.626 m), weight 120 lb 6.4 oz (54.613 kg). General: Well developed, well nourished female in no acute distress. Head: Normocephalic, atraumatic, sclera non-icteric, no xanthomas, nares are without discharge. EENT: normal Lymph Nodes:  none Back: without scoliosis/kyphosis, no CVA tendersness Neck: Negative for carotid bruits. JVD not elevated. Lungs: Clear bilaterally to auscultation without wheezes, rales, or rhonchi. Breathing is unlabored. Heart: RRR with S1 S2. 2/6 systolic murmur , rubs, or gallops appreciated. Abdomen: Soft, non-tender, non-distended with normoactive bowel sounds. No hepatomegaly. No rebound/guarding. No obvious abdominal masses. Msk:  Strength and tone appear normal for age. Extremities: No clubbing or cyanosis. No * edema.  Distal pedal pulses are 2+ and equal bilaterally. Skin: Warm and Dry Neuro: Alert and oriented X 3. CN III-XII intact Grossly normal sensory and motor function . Psych:  Responds to questions appropriately with a normal affect.      Labs: Cardiac Enzymes No results found for this basename: CKTOTAL, CKMB, TROPONINI,  in the last 72 hours CBC Lab Results  Component Value Date   WBC 7.0 03/05/2014   HGB 13.5 03/05/2014   HCT 39.7 03/05/2014   MCV 90.2 03/05/2014   PLT 279 03/05/2014   PROTIME: No results found for this basename: LABPROT, INR,  in the last 72 hours Chemistry No results found for this basename: NA, K, CL, CO2, BUN, CREATININE, CALCIUM, LABALBU, PROT, BILITOT, ALKPHOS, ALT, AST, GLUCOSE,  in  the last 168 hours Lipids No results found for this basename: CHOL, HDL, LDLCALC, TRIG   BNP No results found for this basename: probnp   Miscellaneous No results found for this basename: DDIMER    Radiology/Studies:  No results found.  EKG: Sinus rhythm at 60 Intervals 10/21/41  Event recorder PACs atrial couplets and atrial runs   Assessment and Plan:  Palpitations  PACs/atrial couplets  The patient has 2 syndromes. The first of palpitations have been demonstrated to correlate with PACs/atrial couplets and atrial runs. I've assured her that these in other cells are benign.  The other episodes, given the context of the frequent and complex atrial ectopy, are concerning to me for atrial fibrillation. We discussed a series of technologies including handheld monitors, the AliveCor monitor implantable loop recorder and a 30 day monitor. Her episodes do not occur sufficiently frequently  to use a 30 day monitor; we discussed in the loop recorder versus the AliveCor. She has decided to upgrade her phone. She is going to join the modern era She will get an AliveCor case.    Virl Axe

## 2014-08-08 ENCOUNTER — Telehealth: Payer: Self-pay | Admitting: Internal Medicine

## 2014-08-11 NOTE — Telephone Encounter (Signed)
Close encounter 

## 2014-08-13 DIAGNOSIS — H2510 Age-related nuclear cataract, unspecified eye: Secondary | ICD-10-CM | POA: Diagnosis not present

## 2014-08-13 DIAGNOSIS — H01009 Unspecified blepharitis unspecified eye, unspecified eyelid: Secondary | ICD-10-CM | POA: Diagnosis not present

## 2014-08-13 DIAGNOSIS — H04129 Dry eye syndrome of unspecified lacrimal gland: Secondary | ICD-10-CM | POA: Diagnosis not present

## 2014-08-14 DIAGNOSIS — H40043 Steroid responder, bilateral: Secondary | ICD-10-CM | POA: Diagnosis not present

## 2014-08-14 DIAGNOSIS — B399 Histoplasmosis, unspecified: Secondary | ICD-10-CM | POA: Diagnosis not present

## 2014-08-14 DIAGNOSIS — H32 Chorioretinal disorders in diseases classified elsewhere: Secondary | ICD-10-CM | POA: Diagnosis not present

## 2014-08-14 DIAGNOSIS — H35353 Cystoid macular degeneration, bilateral: Secondary | ICD-10-CM | POA: Diagnosis not present

## 2014-08-14 DIAGNOSIS — H04123 Dry eye syndrome of bilateral lacrimal glands: Secondary | ICD-10-CM | POA: Diagnosis not present

## 2014-08-14 DIAGNOSIS — H31013 Macula scars of posterior pole (postinflammatory) (post-traumatic), bilateral: Secondary | ICD-10-CM | POA: Diagnosis not present

## 2014-08-14 DIAGNOSIS — H4053X4 Glaucoma secondary to other eye disorders, bilateral, indeterminate stage: Secondary | ICD-10-CM | POA: Diagnosis not present

## 2014-08-17 ENCOUNTER — Other Ambulatory Visit: Payer: Self-pay | Admitting: Internal Medicine

## 2014-08-25 DIAGNOSIS — H4053X4 Glaucoma secondary to other eye disorders, bilateral, indeterminate stage: Secondary | ICD-10-CM | POA: Diagnosis not present

## 2014-09-05 ENCOUNTER — Encounter: Payer: Self-pay | Admitting: *Deleted

## 2014-09-05 ENCOUNTER — Ambulatory Visit (INDEPENDENT_AMBULATORY_CARE_PROVIDER_SITE_OTHER): Payer: Medicare Other | Admitting: Internal Medicine

## 2014-09-05 ENCOUNTER — Encounter: Payer: Self-pay | Admitting: Internal Medicine

## 2014-09-05 VITALS — BP 112/64 | HR 54 | Ht 64.0 in | Wt 117.8 lb

## 2014-09-05 DIAGNOSIS — I471 Supraventricular tachycardia: Secondary | ICD-10-CM | POA: Diagnosis not present

## 2014-09-05 DIAGNOSIS — I499 Cardiac arrhythmia, unspecified: Secondary | ICD-10-CM | POA: Diagnosis not present

## 2014-09-05 DIAGNOSIS — I498 Other specified cardiac arrhythmias: Secondary | ICD-10-CM

## 2014-09-05 LAB — CBC WITH DIFFERENTIAL/PLATELET
BASOS PCT: 1.1 % (ref 0.0–3.0)
Basophils Absolute: 0.1 10*3/uL (ref 0.0–0.1)
EOS PCT: 2.3 % (ref 0.0–5.0)
Eosinophils Absolute: 0.2 10*3/uL (ref 0.0–0.7)
HEMATOCRIT: 39.3 % (ref 36.0–46.0)
Hemoglobin: 12.9 g/dL (ref 12.0–15.0)
LYMPHS ABS: 2.3 10*3/uL (ref 0.7–4.0)
Lymphocytes Relative: 24.9 % (ref 12.0–46.0)
MCHC: 32.8 g/dL (ref 30.0–36.0)
MCV: 92.5 fl (ref 78.0–100.0)
MONO ABS: 0.7 10*3/uL (ref 0.1–1.0)
Monocytes Relative: 7.9 % (ref 3.0–12.0)
NEUTROS ABS: 6 10*3/uL (ref 1.4–7.7)
Neutrophils Relative %: 63.8 % (ref 43.0–77.0)
Platelets: 269 10*3/uL (ref 150.0–400.0)
RBC: 4.25 Mil/uL (ref 3.87–5.11)
RDW: 15.2 % (ref 11.5–15.5)
WBC: 9.4 10*3/uL (ref 4.0–10.5)

## 2014-09-05 LAB — BASIC METABOLIC PANEL
BUN: 13 mg/dL (ref 6–23)
CALCIUM: 9.1 mg/dL (ref 8.4–10.5)
CO2: 23 mEq/L (ref 19–32)
Chloride: 102 mEq/L (ref 96–112)
Creatinine, Ser: 0.6 mg/dL (ref 0.4–1.2)
GFR: 113.2 mL/min (ref >60.00)
GLUCOSE: 86 mg/dL (ref 70–99)
Potassium: 3.3 mEq/L — ABNORMAL LOW (ref 3.5–5.1)
Sodium: 138 mEq/L (ref 135–145)

## 2014-09-05 LAB — PROTIME-INR
INR: 1 ratio (ref 0.8–1.0)
Prothrombin Time: 11.3 s (ref 9.6–13.1)

## 2014-09-05 NOTE — Progress Notes (Signed)
      Patient Care Team: Florina Ou, MD as PCP - General (Family Medicine)   HPI  Kelsey James is a 73 y.o. female Seen in followup for palpitations correlated with PACs and atrial runs. The other concern was whether these were related to atrial fibrillation and she and I discussed the use of monitoring and she elected to get AliveCor.  Afterwards, she simply got the new phone and comes in today to decide about different types of monitoring  Past Medical History  Diagnosis Date  . Glaucoma   . Rosacea   . Dry eyes   . Arthritis   . Heart murmur     Past Surgical History  Procedure Laterality Date  . Tonsillectomy and adenoidectomy  age 53    Current Outpatient Prescriptions  Medication Sig Dispense Refill  . amLODipine (NORVASC) 5 MG tablet TAKE 1 TABLET (5 MG TOTAL) BY MOUTH DAILY.  90 tablet  2  . b complex vitamins capsule Take 1 capsule by mouth daily.      . Biotin 1000 MCG tablet Take 1,000 mcg by mouth daily.      . Calcium-Magnesium (CALCIUM MAGNESIUM 750) 300-300 MG TABS Take 2 tablets by mouth 2 (two) times daily.       . Cholecalciferol (VITAMIN D3 PO) Take 1 tablet by mouth daily.      . cycloSPORINE (RESTASIS) 0.05 % ophthalmic emulsion Place 1 drop into both eyes 2 (two) times daily.      . hydroxypropyl methylcellulose (ISOPTO TEARS) 2.5 % ophthalmic solution Place 1 drop into both eyes as needed for dry eyes.      Marland Kitchen LECITHIN PO Take 1 capsule by mouth daily.      . LUTEIN PO Take 1 capsule by mouth daily.      Marland Kitchen MAGNESIUM PO Take by mouth daily.      . Omega-3 Fatty Acids (FISH OIL PO) Take 2 capsules by mouth 2 (two) times daily.       Marland Kitchen OVER THE COUNTER MEDICATION Take 1 capsule by mouth 2 (two) times daily. Vegetal Silica      . OVER THE COUNTER MEDICATION Take 1 capsule by mouth 2 (two) times daily. Perfect Eyes supplement       No current facility-administered medications for this visit.    Allergies  Allergen Reactions  . Other Nausea And  Vomiting    Mushrooms   . Shellfish Allergy Nausea And Vomiting    Review of Systems negative except from HPI and PMH  Physical Exam BP 112/64  Pulse 54  Ht 5\' 4"  (1.626 m)  Wt 117 lb 12.8 oz (53.434 kg)  BMI 20.21 kg/m2 Well developed and well nourished in no acute distress HENT normal E scleral and icterus clear Neck Supple JVP flat; carotids brisk and full Clear to ausculation  Regular rate and rhythm, no murmurs gallops or rub Soft with active bowel sounds No clubbing cyanosis  Edema Alert and oriented, grossly normal motor and sensory function Skin Warm and Dry    Assessment and  Plan Atrial ectopy. The patient has atrial ectopy. The question is whether the patient has atrial fibrillation given the complexity in the frequency of her atrial ectopy. We discussed the use of AliveCor an implantable loop recorder. She would like to pursue the latter.\  We have discussed the procedural risks and benefits including but not limited to infection

## 2014-09-09 ENCOUNTER — Encounter (HOSPITAL_COMMUNITY): Payer: Self-pay | Admitting: Pharmacy Technician

## 2014-09-10 ENCOUNTER — Telehealth: Payer: Self-pay | Admitting: Internal Medicine

## 2014-09-10 DIAGNOSIS — M1811 Unilateral primary osteoarthritis of first carpometacarpal joint, right hand: Secondary | ICD-10-CM | POA: Diagnosis not present

## 2014-09-10 DIAGNOSIS — M1812 Unilateral primary osteoarthritis of first carpometacarpal joint, left hand: Secondary | ICD-10-CM | POA: Diagnosis not present

## 2014-09-10 DIAGNOSIS — M19041 Primary osteoarthritis, right hand: Secondary | ICD-10-CM | POA: Diagnosis not present

## 2014-09-10 DIAGNOSIS — M67441 Ganglion, right hand: Secondary | ICD-10-CM | POA: Diagnosis not present

## 2014-09-10 NOTE — Telephone Encounter (Signed)
Follow Up  Pt called to follow Up on Cath time. She said the time changed and she would like to confirm. Please call

## 2014-09-10 NOTE — Telephone Encounter (Signed)
Patient reports she got a call from Dr. Caryl Comes last evening about possibly moving her appointment for a loop recorder from 7:30 am to 1:00pm on 09/15/14. She is the only patient on his schedule so far, so will have to wait and get in touch with him to see if he really wants that time changed. Patient aware.

## 2014-09-11 ENCOUNTER — Telehealth: Payer: Self-pay | Admitting: Internal Medicine

## 2014-09-11 NOTE — Telephone Encounter (Signed)
New message     What time is patient's procedure scheduled for Monday?  What time should she be at the hosp?

## 2014-09-11 NOTE — Telephone Encounter (Signed)
Spoke with Dr. Caryl Comes. He said patient may come as 9:30 case or 1:00pm case. She is already on the schedule for 9:30 case, so she will be at the Hospital at 7:30am

## 2014-09-12 ENCOUNTER — Other Ambulatory Visit: Payer: Self-pay | Admitting: Orthopedic Surgery

## 2014-09-14 DIAGNOSIS — I491 Atrial premature depolarization: Secondary | ICD-10-CM | POA: Diagnosis not present

## 2014-09-14 DIAGNOSIS — Z79899 Other long term (current) drug therapy: Secondary | ICD-10-CM | POA: Diagnosis not present

## 2014-09-14 MED ORDER — CHLORHEXIDINE GLUCONATE 4 % EX LIQD
60.0000 mL | Freq: Once | CUTANEOUS | Status: DC
  Filled 2014-09-14: qty 60

## 2014-09-14 MED ORDER — CEFAZOLIN SODIUM-DEXTROSE 2-3 GM-% IV SOLR: 2.0000 g | INTRAVENOUS | Status: DC

## 2014-09-15 ENCOUNTER — Encounter (HOSPITAL_COMMUNITY)
Admission: RE | Disposition: A | Discharge status: Home / Self Care | Payer: Self-pay | Source: Ambulatory Visit | Attending: Internal Medicine

## 2014-09-15 ENCOUNTER — Ambulatory Visit (HOSPITAL_COMMUNITY)
Admission: RE | Admit: 2014-09-15 | Discharge: 2014-09-15 | Disposition: A | Discharge status: Home / Self Care | Payer: Medicare Other | Source: Ambulatory Visit | Attending: Internal Medicine | Admitting: Internal Medicine

## 2014-09-15 ENCOUNTER — Encounter: Payer: Self-pay | Admitting: Internal Medicine

## 2014-09-15 DIAGNOSIS — I471 Supraventricular tachycardia: Secondary | ICD-10-CM

## 2014-09-15 DIAGNOSIS — Z79899 Other long term (current) drug therapy: Secondary | ICD-10-CM | POA: Insufficient documentation

## 2014-09-15 DIAGNOSIS — I491 Atrial premature depolarization: Secondary | ICD-10-CM | POA: Diagnosis not present

## 2014-09-15 DIAGNOSIS — R002 Palpitations: Secondary | ICD-10-CM | POA: Diagnosis not present

## 2014-09-15 HISTORY — PX: LOOP RECORDER IMPLANT: SHX5477

## 2014-09-15 LAB — POTASSIUM: POTASSIUM: 3.3 meq/L — AB (ref 3.7–5.3)

## 2014-09-15 SURGERY — LOOP RECORDER IMPLANT
Anesthesia: LOCAL

## 2014-09-15 MED ORDER — CEFAZOLIN SODIUM-DEXTROSE 2-3 GM-% IV SOLR
INTRAVENOUS | Status: AC
  Filled 2014-09-15: qty 50

## 2014-09-15 MED ORDER — LIDOCAINE-EPINEPHRINE 1 %-1:100000 IJ SOLN
INTRAMUSCULAR | Status: AC
  Filled 2014-09-15: qty 1

## 2014-09-15 NOTE — Progress Notes (Signed)
Dr Caryl Comes was contacted regarding K+ 3.3, Dr Caryl Comes aware and will adjust potassium via office.

## 2014-09-15 NOTE — Interval H&P Note (Signed)
History and Physical Interval Note:  09/15/2014 1:41 PM  Kelsey James  has presented today for surgery, with the diagnosis of pacs, afib  The various methods of treatment have been discussed with the patient and family. After consideration of risks, benefits and other options for treatment, the patient has consented to  Procedure(s): LOOP RECORDER IMPLANT (N/A) as a surgical intervention .  The patient's history has been reviewed, patient examined, no change in status, stable for surgery.  I have reviewed the patient's chart and labs.  Questions were answered to the patient's satisfaction.     Virl Axe

## 2014-09-15 NOTE — H&P (View-Only) (Signed)
      Patient Care Team: Florina Ou, MD as PCP - General (Family Medicine)   HPI  Kelsey James is a 73 y.o. female Seen in followup for palpitations correlated with PACs and atrial runs. The other concern was whether these were related to atrial fibrillation and she and I discussed the use of monitoring and she elected to get AliveCor.  Afterwards, she simply got the new phone and comes in today to decide about different types of monitoring  Past Medical History  Diagnosis Date  . Glaucoma   . Rosacea   . Dry eyes   . Arthritis   . Heart murmur     Past Surgical History  Procedure Laterality Date  . Tonsillectomy and adenoidectomy  age 66    Current Outpatient Prescriptions  Medication Sig Dispense Refill  . amLODipine (NORVASC) 5 MG tablet TAKE 1 TABLET (5 MG TOTAL) BY MOUTH DAILY.  90 tablet  2  . b complex vitamins capsule Take 1 capsule by mouth daily.      . Biotin 1000 MCG tablet Take 1,000 mcg by mouth daily.      . Calcium-Magnesium (CALCIUM MAGNESIUM 750) 300-300 MG TABS Take 2 tablets by mouth 2 (two) times daily.       . Cholecalciferol (VITAMIN D3 PO) Take 1 tablet by mouth daily.      . cycloSPORINE (RESTASIS) 0.05 % ophthalmic emulsion Place 1 drop into both eyes 2 (two) times daily.      . hydroxypropyl methylcellulose (ISOPTO TEARS) 2.5 % ophthalmic solution Place 1 drop into both eyes as needed for dry eyes.      Marland Kitchen LECITHIN PO Take 1 capsule by mouth daily.      . LUTEIN PO Take 1 capsule by mouth daily.      Marland Kitchen MAGNESIUM PO Take by mouth daily.      . Omega-3 Fatty Acids (FISH OIL PO) Take 2 capsules by mouth 2 (two) times daily.       Marland Kitchen OVER THE COUNTER MEDICATION Take 1 capsule by mouth 2 (two) times daily. Vegetal Silica      . OVER THE COUNTER MEDICATION Take 1 capsule by mouth 2 (two) times daily. Perfect Eyes supplement       No current facility-administered medications for this visit.    Allergies  Allergen Reactions  . Other Nausea And  Vomiting    Mushrooms   . Shellfish Allergy Nausea And Vomiting    Review of Systems negative except from HPI and PMH  Physical Exam BP 112/64  Pulse 54  Ht 5\' 4"  (1.626 m)  Wt 117 lb 12.8 oz (53.434 kg)  BMI 20.21 kg/m2 Well developed and well nourished in no acute distress HENT normal E scleral and icterus clear Neck Supple JVP flat; carotids brisk and full Clear to ausculation  Regular rate and rhythm, no murmurs gallops or rub Soft with active bowel sounds No clubbing cyanosis  Edema Alert and oriented, grossly normal motor and sensory function Skin Warm and Dry    Assessment and  Plan Atrial ectopy. The patient has atrial ectopy. The question is whether the patient has atrial fibrillation given the complexity in the frequency of her atrial ectopy. We discussed the use of AliveCor an implantable loop recorder. She would like to pursue the latter.\  We have discussed the procedural risks and benefits including but not limited to infection

## 2014-09-15 NOTE — Discharge Instructions (Signed)
°  Dressing Change  Follow up in office in 5 days Keep dressing dry 24hrs  A dressing is a material placed over wounds. It keeps the wound clean, dry, and protected from further injury.  BEFORE YOU BEGIN  Get your supplies together. Things you may need include:  Salt solution (saline).  Flexible gauze bandage.  Medicated cream.  Tape.  Gloves.  Belly (abdominal) pads.  Gauze squares.  Plastic bags.  Take pain medicine 30 minutes before the bandage change if you need it.  Take a shower before you do the first bandage change of the day. Put plastic wrap or a bag over the dressing. REMOVING YOUR OLD BANDAGE  Wash your hands with soap and water. Dry your hands with a clean towel.  Put on your gloves.  Remove any tape.  Remove the old bandage as told. If it sticks, put a small amount of warm water on it to loosen the bandage.  Remove any gauze or packing tape in your wound.  Take off your gloves.  Put the gloves, tape, gauze, or any packing tape in a plastic bag. CHANGING YOUR BANDAGE  Open the supplies.  Take the cap off the salt solution.  Open the gauze. Leave the gauze on the inside of the package.  Put on your gloves.  Clean your wound as told by your doctor.  Keep your wound dry if your doctor told you to do so.  Your doctor may tell you to do one or more of the following:  Pick up the gauze. Pour the salt solution over the gauze. Squeeze out the extra salt solution.  Put medicated cream or other medicine on your wound.  Put solution soaked gauze only in your wound, not on the skin around it.  Pack your wound loosely.  Put dry gauze on your wound.  Put belly pads over the dry gauze if your bandages soak through.  Tape the bandage in place so it will not fall off. Do not wrap the tape all the way around your arm or leg.  Wrap the bandage with the flexible gauze bandage as told by your doctor.  Take off your gloves. Put them in the plastic bag  with the old bandage. Tie the bag shut and throw it away.  Keep the bandage clean and dry.  Wash your hands. GET HELP RIGHT AWAY IF:   Your skin around the wound looks red.  Your wound feels more tender or sore.  You see yellowish-white fluid (pus) in the wound.  Your wound smells bad.  You have a fever.  Your skin around the wound has a red rash that itches and burns.  You see black or yellow skin in your wound that was not there before.  You feel sick to your stomach (nauseous), throw up (vomit), and feel very tired. Document Released: 01/27/2009 Document Revised: 03/17/2014 Document Reviewed: 09/11/2011 [facility] Patient Information 2015 [place], [place]. This information is not intended to replace advice given to you by your health care provider. Make sure you discuss any questions you have with your health care provider. Marland Kitchen

## 2014-09-15 NOTE — CV Procedure (Signed)
.  Pre op Dx atrial ectopy palpitations Post op Dx    Procedure  Loop Recorder implantation  After routine prep and drape of the left parasternal area, a small incision was created. A Medtronic LINQ Reveal Loop Recorder  Serial Number  [id number] was inserted.    SteriStrip dressing was  applied.  The patient tolerated the procedure without apparent complication.

## 2014-09-15 NOTE — Progress Notes (Signed)
I called Dr Caryl Comes, he is scrubbed into his next case, verbal order to discharge the pt to home and he will write the order after.

## 2014-09-19 DIAGNOSIS — H4053X4 Glaucoma secondary to other eye disorders, bilateral, indeterminate stage: Secondary | ICD-10-CM | POA: Diagnosis not present

## 2014-09-22 DIAGNOSIS — H4053X3 Glaucoma secondary to other eye disorders, bilateral, severe stage: Secondary | ICD-10-CM | POA: Diagnosis not present

## 2014-09-22 DIAGNOSIS — H40043 Steroid responder, bilateral: Secondary | ICD-10-CM | POA: Diagnosis not present

## 2014-09-23 ENCOUNTER — Ambulatory Visit (INDEPENDENT_AMBULATORY_CARE_PROVIDER_SITE_OTHER): Payer: Medicare Other | Admitting: *Deleted

## 2014-09-23 ENCOUNTER — Encounter (HOSPITAL_BASED_OUTPATIENT_CLINIC_OR_DEPARTMENT_OTHER): Payer: Self-pay | Admitting: *Deleted

## 2014-09-23 VITALS — BP 119/67

## 2014-09-23 DIAGNOSIS — R002 Palpitations: Secondary | ICD-10-CM

## 2014-09-23 LAB — MDC_IDC_ENUM_SESS_TYPE_INCLINIC
Date Time Interrogation Session: 20151110152258
Zone Setting Detection Interval: 2000 ms
Zone Setting Detection Interval: 3000 ms
Zone Setting Detection Interval: 380 ms

## 2014-09-23 NOTE — Progress Notes (Signed)
Wound check appointment. Steri-strips removed prior to arrival. Wound without redness or edema. Incision edges approximated, wound well healed. Normal device function. Pt with 0 tachy episodes; 0 brady episodes; 0 asystole. Symptomatic episodes were appropriate---PVCs. 1 pause episode shows noise, previously noted. ROV w/ Dr. Caryl Comes 12/23/14.

## 2014-09-25 ENCOUNTER — Encounter (HOSPITAL_BASED_OUTPATIENT_CLINIC_OR_DEPARTMENT_OTHER)
Admission: RE | Disposition: A | Discharge status: Home / Self Care | Payer: Self-pay | Source: Ambulatory Visit | Attending: Orthopedic Surgery

## 2014-09-25 ENCOUNTER — Ambulatory Visit (HOSPITAL_BASED_OUTPATIENT_CLINIC_OR_DEPARTMENT_OTHER): Payer: Medicare Other | Admitting: Certified Registered"

## 2014-09-25 ENCOUNTER — Ambulatory Visit (HOSPITAL_BASED_OUTPATIENT_CLINIC_OR_DEPARTMENT_OTHER)
Admission: RE | Admit: 2014-09-25 | Discharge: 2014-09-25 | Disposition: A | Discharge status: Home / Self Care | Payer: Medicare Other | Source: Ambulatory Visit | Attending: Orthopedic Surgery | Admitting: Orthopedic Surgery

## 2014-09-25 ENCOUNTER — Encounter (HOSPITAL_BASED_OUTPATIENT_CLINIC_OR_DEPARTMENT_OTHER): Payer: Self-pay | Admitting: Certified Registered"

## 2014-09-25 DIAGNOSIS — Z87891 Personal history of nicotine dependence: Secondary | ICD-10-CM | POA: Insufficient documentation

## 2014-09-25 DIAGNOSIS — H04123 Dry eye syndrome of bilateral lacrimal glands: Secondary | ICD-10-CM | POA: Diagnosis not present

## 2014-09-25 DIAGNOSIS — Z91018 Allergy to other foods: Secondary | ICD-10-CM | POA: Diagnosis not present

## 2014-09-25 DIAGNOSIS — L719 Rosacea, unspecified: Secondary | ICD-10-CM | POA: Diagnosis not present

## 2014-09-25 DIAGNOSIS — M19041 Primary osteoarthritis, right hand: Secondary | ICD-10-CM | POA: Insufficient documentation

## 2014-09-25 DIAGNOSIS — I491 Atrial premature depolarization: Secondary | ICD-10-CM | POA: Insufficient documentation

## 2014-09-25 DIAGNOSIS — M67441 Ganglion, right hand: Secondary | ICD-10-CM | POA: Diagnosis not present

## 2014-09-25 DIAGNOSIS — L720 Epidermal cyst: Secondary | ICD-10-CM | POA: Diagnosis not present

## 2014-09-25 DIAGNOSIS — I10 Essential (primary) hypertension: Secondary | ICD-10-CM | POA: Diagnosis not present

## 2014-09-25 DIAGNOSIS — R011 Cardiac murmur, unspecified: Secondary | ICD-10-CM | POA: Diagnosis not present

## 2014-09-25 DIAGNOSIS — M25841 Other specified joint disorders, right hand: Secondary | ICD-10-CM | POA: Diagnosis not present

## 2014-09-25 DIAGNOSIS — Z91013 Allergy to seafood: Secondary | ICD-10-CM | POA: Insufficient documentation

## 2014-09-25 DIAGNOSIS — H409 Unspecified glaucoma: Secondary | ICD-10-CM | POA: Insufficient documentation

## 2014-09-25 HISTORY — PX: MASS EXCISION: SHX2000

## 2014-09-25 HISTORY — DX: Presence of spectacles and contact lenses: Z97.3

## 2014-09-25 SURGERY — EXCISION MASS
Anesthesia: Monitor Anesthesia Care | Site: Finger | Laterality: Right

## 2014-09-25 MED ORDER — PROPOFOL INFUSION 10 MG/ML OPTIME
INTRAVENOUS | Status: DC | PRN
  Administered 2014-09-25: 75 ug/kg/min via INTRAVENOUS

## 2014-09-25 MED ORDER — BUPIVACAINE HCL (PF) 0.25 % IJ SOLN
INTRAMUSCULAR | Status: DC | PRN
  Administered 2014-09-25: 10 mL

## 2014-09-25 MED ORDER — ONDANSETRON HCL 4 MG/2ML IJ SOLN
INTRAMUSCULAR | Status: DC | PRN
  Administered 2014-09-25: 4 mg via INTRAVENOUS

## 2014-09-25 MED ORDER — MIDAZOLAM HCL 2 MG/2ML IJ SOLN
1.0000 mg | INTRAMUSCULAR | Status: DC | PRN
Start: 2014-09-25 — End: 2014-09-25

## 2014-09-25 MED ORDER — FENTANYL CITRATE 0.05 MG/ML IJ SOLN
50.0000 ug | INTRAMUSCULAR | Status: DC | PRN
Start: 2014-09-25 — End: 2014-09-25

## 2014-09-25 MED ORDER — CEFAZOLIN SODIUM-DEXTROSE 2-3 GM-% IV SOLR
2.0000 g | INTRAVENOUS | Status: AC
  Administered 2014-09-25: 2 g via INTRAVENOUS

## 2014-09-25 MED ORDER — FENTANYL CITRATE 0.05 MG/ML IJ SOLN: 25.0000 ug | INTRAMUSCULAR | Status: DC | PRN

## 2014-09-25 MED ORDER — LIDOCAINE HCL (CARDIAC) 20 MG/ML IV SOLN
INTRAVENOUS | Status: DC | PRN
  Administered 2014-09-25: 30 mg via INTRAVENOUS

## 2014-09-25 MED ORDER — ACETAMINOPHEN-CODEINE #3 300-30 MG PO TABS: 1.0000 | ORAL_TABLET | Freq: Four times a day (QID) | ORAL | Status: DC | PRN

## 2014-09-25 MED ORDER — LACTATED RINGERS IV SOLN
INTRAVENOUS | Status: DC
  Administered 2014-09-25: 10:00:00 via INTRAVENOUS

## 2014-09-25 MED ORDER — ONDANSETRON HCL 4 MG/2ML IJ SOLN: 4.0000 mg | Freq: Once | INTRAMUSCULAR | Status: DC | PRN

## 2014-09-25 MED ORDER — LIDOCAINE HCL (PF) 0.5 % IJ SOLN
INTRAMUSCULAR | Status: DC | PRN
  Administered 2014-09-25: 30 mL via INTRAVENOUS

## 2014-09-25 MED ORDER — CHLORHEXIDINE GLUCONATE 4 % EX LIQD: 60.0000 mL | Freq: Once | CUTANEOUS | Status: DC

## 2014-09-25 MED ORDER — CEFAZOLIN SODIUM-DEXTROSE 2-3 GM-% IV SOLR
INTRAVENOUS | Status: AC
  Filled 2014-09-25: qty 50

## 2014-09-25 MED ORDER — BUPIVACAINE HCL (PF) 0.25 % IJ SOLN
INTRAMUSCULAR | Status: AC
Start: 2014-09-25 — End: 2014-09-25
  Filled 2014-09-25: qty 30

## 2014-09-25 MED ORDER — FENTANYL CITRATE 0.05 MG/ML IJ SOLN
INTRAMUSCULAR | Status: DC | PRN
  Administered 2014-09-25: 25 ug via INTRAVENOUS

## 2014-09-25 MED ORDER — FENTANYL CITRATE 0.05 MG/ML IJ SOLN
INTRAMUSCULAR | Status: AC
  Filled 2014-09-25: qty 4

## 2014-09-25 SURGICAL SUPPLY — 57 items
APL SKNCLS STERI-STRIP NONHPOA (GAUZE/BANDAGES/DRESSINGS)
BANDAGE COBAN STERILE 2 (GAUZE/BANDAGES/DRESSINGS) IMPLANT
BANDAGE ELASTIC 3 VELCRO ST LF (GAUZE/BANDAGES/DRESSINGS) IMPLANT
BENZOIN TINCTURE PRP APPL 2/3 (GAUZE/BANDAGES/DRESSINGS) IMPLANT
BLADE MINI RND TIP GREEN BEAV (BLADE) IMPLANT
BLADE SURG 15 STRL LF DISP TIS (BLADE) ×2 IMPLANT
BLADE SURG 15 STRL SS (BLADE) ×6
BNDG CMPR 9X4 STRL LF SNTH (GAUZE/BANDAGES/DRESSINGS) ×1
BNDG CMPR MD 5X2 ELC HKLP STRL (GAUZE/BANDAGES/DRESSINGS)
BNDG COHESIVE 1X5 TAN STRL LF (GAUZE/BANDAGES/DRESSINGS) ×2 IMPLANT
BNDG CONFORM 2 STRL LF (GAUZE/BANDAGES/DRESSINGS) IMPLANT
BNDG ELASTIC 2 VLCR STRL LF (GAUZE/BANDAGES/DRESSINGS) IMPLANT
BNDG ESMARK 4X9 LF (GAUZE/BANDAGES/DRESSINGS) ×2 IMPLANT
BNDG GAUZE 1X2.1 STRL (MISCELLANEOUS) ×2 IMPLANT
BNDG GAUZE ELAST 4 BULKY (GAUZE/BANDAGES/DRESSINGS) IMPLANT
BNDG PLASTER X FAST 3X3 WHT LF (CAST SUPPLIES) IMPLANT
BNDG PLSTR 9X3 FST ST WHT (CAST SUPPLIES)
CHLORAPREP W/TINT 26ML (MISCELLANEOUS) ×3 IMPLANT
CLOSURE WOUND 1/2 X4 (GAUZE/BANDAGES/DRESSINGS)
CORDS BIPOLAR (ELECTRODE) ×3 IMPLANT
COVER BACK TABLE 60X90IN (DRAPES) ×3 IMPLANT
COVER MAYO STAND STRL (DRAPES) ×3 IMPLANT
CUFF TOURNIQUET SINGLE 18IN (TOURNIQUET CUFF) ×3 IMPLANT
DRAPE EXTREMITY T 121X128X90 (DRAPE) ×3 IMPLANT
DRAPE SURG 17X23 STRL (DRAPES) ×3 IMPLANT
GAUZE SPONGE 4X4 12PLY STRL (GAUZE/BANDAGES/DRESSINGS) ×3 IMPLANT
GAUZE XEROFORM 1X8 LF (GAUZE/BANDAGES/DRESSINGS) ×3 IMPLANT
GLOVE BIO SURGEON STRL SZ7.5 (GLOVE) ×3 IMPLANT
GLOVE BIOGEL PI IND STRL 6.5 (GLOVE) IMPLANT
GLOVE BIOGEL PI IND STRL 8 (GLOVE) ×1 IMPLANT
GLOVE BIOGEL PI INDICATOR 6.5 (GLOVE) ×2
GLOVE BIOGEL PI INDICATOR 8 (GLOVE) ×2
GLOVE ECLIPSE 6.5 STRL STRAW (GLOVE) ×2 IMPLANT
GOWN STRL REUS W/ TWL LRG LVL3 (GOWN DISPOSABLE) ×1 IMPLANT
GOWN STRL REUS W/TWL LRG LVL3 (GOWN DISPOSABLE) ×3
GOWN STRL REUS W/TWL XL LVL3 (GOWN DISPOSABLE) ×3 IMPLANT
NEEDLE HYPO 25X1 1.5 SAFETY (NEEDLE) ×3 IMPLANT
NS IRRIG 1000ML POUR BTL (IV SOLUTION) ×3 IMPLANT
PACK BASIN DAY SURGERY FS (CUSTOM PROCEDURE TRAY) ×3 IMPLANT
PAD CAST 3X4 CTTN HI CHSV (CAST SUPPLIES) IMPLANT
PAD CAST 4YDX4 CTTN HI CHSV (CAST SUPPLIES) IMPLANT
PADDING CAST ABS 4INX4YD NS (CAST SUPPLIES)
PADDING CAST ABS COTTON 4X4 ST (CAST SUPPLIES) ×1 IMPLANT
PADDING CAST COTTON 3X4 STRL (CAST SUPPLIES)
PADDING CAST COTTON 4X4 STRL (CAST SUPPLIES)
SPLINT FINGER 3.25 911903 (SOFTGOODS) ×2 IMPLANT
STOCKINETTE 4X48 STRL (DRAPES) ×3 IMPLANT
STRIP CLOSURE SKIN 1/2X4 (GAUZE/BANDAGES/DRESSINGS) IMPLANT
SUT ETHILON 3 0 PS 1 (SUTURE) IMPLANT
SUT ETHILON 4 0 PS 2 18 (SUTURE) ×3 IMPLANT
SUT ETHILON 5 0 P 3 18 (SUTURE)
SUT NYLON ETHILON 5-0 P-3 1X18 (SUTURE) IMPLANT
SUT VIC AB 4-0 P2 18 (SUTURE) IMPLANT
SYR BULB 3OZ (MISCELLANEOUS) ×3 IMPLANT
SYR CONTROL 10ML LL (SYRINGE) ×3 IMPLANT
TOWEL OR 17X24 6PK STRL BLUE (TOWEL DISPOSABLE) ×4 IMPLANT
UNDERPAD 30X30 INCONTINENT (UNDERPADS AND DIAPERS) ×3 IMPLANT

## 2014-09-25 NOTE — Transfer of Care (Signed)
Immediate Anesthesia Transfer of Care Note  Patient: Kelsey James  Procedure(s) Performed: Procedure(s): RIGHT RING FINGER EXCISION MASS AND DEBRIDEMENT DIP JOINT (Right)  Patient Location: PACU  Anesthesia Type:Bier block  Level of Consciousness: awake, alert , oriented and patient cooperative  Airway & Oxygen Therapy: Patient Spontanous Breathing and Patient connected to face mask oxygen  Post-op Assessment: Report given to PACU RN and Post -op Vital signs reviewed and stable  Post vital signs: Reviewed and stable  Complications: No apparent anesthesia complications

## 2014-09-25 NOTE — Discharge Instructions (Addendum)

## 2014-09-25 NOTE — Anesthesia Postprocedure Evaluation (Signed)
  Anesthesia Post-op Note  Patient: Kelsey James  Procedure(s) Performed: Procedure(s): RIGHT RING FINGER EXCISION MASS AND DEBRIDEMENT DIP JOINT (Right)  Patient Location: PACU  Anesthesia Type: MAC, Bier Block   Level of Consciousness: awake, alert  and oriented  Airway and Oxygen Therapy: Patient Spontanous Breathing  Post-op Pain: none  Post-op Assessment: Post-op Vital signs reviewed  Post-op Vital Signs: Reviewed  Last Vitals:  Filed Vitals:   09/25/14 1130  BP: 114/66  Pulse: 58  Temp:   Resp: 16    Complications: No apparent anesthesia complications

## 2014-09-25 NOTE — Anesthesia Preprocedure Evaluation (Addendum)
Anesthesia Evaluation  Patient identified by MRN, date of birth, ID band Patient awake    Reviewed: Allergy & Precautions, H&P , NPO status   Airway Mallampati: I  TM Distance: >3 FB Neck ROM: Full    Dental  (+) Teeth Intact, Dental Advisory Given   Pulmonary former smoker,  breath sounds clear to auscultation        Cardiovascular hypertension, Pt. on medications Rhythm:Regular Rate:Normal     Neuro/Psych    GI/Hepatic   Endo/Other    Renal/GU      Musculoskeletal  (+) Arthritis -,   Abdominal   Peds  Hematology   Anesthesia Other Findings Loop recorder in place for 10 days to evaluate rhythm (Dr. Caryl Comes).  Reproductive/Obstetrics                           Anesthesia Physical Anesthesia Plan  ASA: II  Anesthesia Plan: MAC and Bier Block   Post-op Pain Management:    Induction: Intravenous  Airway Management Planned: Simple Face Mask  Additional Equipment:   Intra-op Plan:   Post-operative Plan:   Informed Consent: I have reviewed the patients History and Physical, chart, labs and discussed the procedure including the risks, benefits and alternatives for the proposed anesthesia with the patient or authorized representative who has indicated his/her understanding and acceptance.   Dental advisory given  Plan Discussed with: CRNA and Anesthesiologist  Anesthesia Plan Comments:         Anesthesia Quick Evaluation

## 2014-09-25 NOTE — H&P (Signed)
Kelsey James is an 73 y.o. female.   Chief Complaint: right ring mucoid cyst HPI: 74 yo rhd female with mucoid cyst right ring finger x 1 year.  Increased in size.  It is bothersome to her and she wishes to have it removed.  Past Medical History  Diagnosis Date  . Glaucoma   . Rosacea   . Dry eyes   . Arthritis   . Heart murmur   . Atrial ectopy   . Wears glasses   . History of loop recorder     Past Surgical History  Procedure Laterality Date  . Tonsillectomy and adenoidectomy  age 46  . Tubal ligation    . Colonoscopy      Family History  Problem Relation Age of Onset  . Valvular heart disease Mother   . Cancer Maternal Grandmother   . Depression Father    Social History:  reports that she quit smoking about 11 years ago. Her smoking use included Cigarettes. She has a 12.5 pack-year smoking history. She has never used smokeless tobacco. She reports that she drinks about 4.2 oz of alcohol per week. She reports that she does not use illicit drugs.  Allergies:  Allergies  Allergen Reactions  . Other Nausea And Vomiting    Mushrooms   . Shellfish Allergy Nausea And Vomiting    Medications Prior to Admission  Medication Sig Dispense Refill  . acetaminophen (TYLENOL) 500 MG tablet Take 500-1,000 mg by mouth every 6 (six) hours as needed (pain).    Marland Kitchen amLODipine (NORVASC) 5 MG tablet Take 5 mg by mouth daily.    Marland Kitchen aspirin 325 MG tablet Take 325-650 mg by mouth daily as needed for headache (pain).    Marland Kitchen b complex vitamins capsule Take 1 capsule by mouth daily.    . Biotin 1000 MCG tablet Take 1,000 mcg by mouth daily.    . Calcium-Magnesium (CALCIUM MAGNESIUM 750) 300-300 MG TABS Take 2 tablets by mouth 2 (two) times daily.     . Cholecalciferol (VITAMIN D3 PO) Take 1 tablet by mouth daily.    . cycloSPORINE (RESTASIS) 0.05 % ophthalmic emulsion Place 1 drop into both eyes 2 (two) times daily.    . hydroxypropyl methylcellulose (ISOPTO TEARS) 2.5 % ophthalmic solution  Place 1 drop into both eyes as needed for dry eyes.    Marland Kitchen LECITHIN PO Take 1 capsule by mouth daily.    . LUTEIN PO Take 1 capsule by mouth daily.    Marland Kitchen MAGNESIUM PO Take 2 tablets by mouth 2 (two) times daily.     . Multiple Vitamins-Minerals (ANTIOXIDANT PO) Take 1 capsule by mouth 2 (two) times daily. BioAstin    . Omega-3 Fatty Acids (FISH OIL PO) Take 1 capsule by mouth 2 (two) times daily.     Marland Kitchen OVER THE COUNTER MEDICATION Take 1 capsule by mouth 2 (two) times daily. Vegetal Silica    . OVER THE COUNTER MEDICATION Take 1 capsule by mouth 2 (two) times daily. Perfect Eyes supplement    . Tafluprost (ZIOPTAN) 0.0015 % SOLN Place 1 drop into both eyes every other day.       No results found for this or any previous visit (from the past 48 hour(s)).  No results found.   A comprehensive review of systems was negative except for: Eyes: positive for contacts/glasses Respiratory: positive for shortness of breath Hematologic/lymphatic: positive for easy bruising  Blood pressure 125/69, pulse 58, temperature 97.7 F (36.5 C), temperature source Oral,  resp. rate 16, height 5\' 4"  (1.626 m), weight 55.906 kg (123 lb 4 oz), SpO2 100 %.  General appearance: alert, cooperative and appears stated age Head: Normocephalic, without obvious abnormality, atraumatic Neck: supple, symmetrical, trachea midline Resp: clear to auscultation bilaterally Cardio: regular rate and rhythm GI: non tender Extremities: intact sensation and capillary refill all digits.  +epl/fpl/io.  no wounds. Pulses: 2+ and symmetric Skin: Skin color, texture, turgor normal. 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  Non operative and operative treatment options were discussed with the patient and patient wishes to proceed with operative treatment. Risks, benefits, and alternatives of surgery were discussed and the patient agrees with  the plan of care.  Discussed potential need for rotational flap for coverage if necessary and patient agrees.   Trinette Vera R 09/25/2014, 8:32 AM

## 2014-09-25 NOTE — Op Note (Signed)
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  PROCEDURE:  Right ring finger excision of cyst and debridement of distal interphalangeal joint.  SURGEON:  Leanora Cover, MD  ASSISTANT:  None.  ANESTHESIA:  Bier block with sedation.  IV FLUIDS:  Per anesthesia flow sheet.  ESTIMATED BLOOD LOSS:  Minimal.  COMPLICATIONS:  None.  SPECIMENS:  Right ring finger cyst to Pathology.  TOURNIQUET TIME:  22 minutes.  DISPOSITION:  Stable to PACU.  INDICATIONS:  Kelsey James is a 73 year old female who has had a cyst on the right ring finger.  It is bothersome to her.  She wished to have it excised.  Risks, benefits, and alternatives of surgery were discussed including risk of blood loss, infection, damage to nerves, vessels, tendons, ligaments, bone, failure of surgery, need for additional surgery, complications with wound healing, continued pain, and recurrence of the cyst.  She voiced understanding of these risks and elected to proceed.  OPERATIVE COURSE:  After being identified preoperatively by myself, the patient and I agreed upon procedure and site of procedure.  Surgical site was marked.  The risks, benefits, and alternatives of surgery were reviewed and she wished to proceed.  Surgical consent had been signed. She was given IV Ancef as preoperative antibiotic prophylaxis.  She was transported to the operating room and placed on the operating room table in supine position with the right upper extremity on arm board.   Bier block anesthesia was induced by anesthesiologist.  Right upper extremity was prepped and draped in normal sterile orthopedic fashion.  A surgical pause was performed between surgeons, anesthesia, and operating room staff, and all were in agreement as to the patient, procedure, and site of procedure.  Tourniquet at the proximal aspect of the forearm had been inflated with Bier block.  Hockey stick shaped incision was made at the DIP joint of the right ring finger.  This was carried into subcutaneous tissues by a spreading technique.  The cyst was easily identified.  It was excised and sent to Pathology for examination.  The DIP joint was entered underneath the extensor tendon.  It was debrided using the synovectomy rongeurs.  The wound was copiously irrigated with sterile saline and was closed with 4-0 nylon in a horizontal mattress fashion. It was then dressed with sterile Xeroform, 4x4s, and wrapped with a Coban dressing lightly.  An Alumafoam splint was placed and wrapped with a Coban dressing lightly.  A digital block had been performed with 10 mL of 0.25% plain Marcaine to aid in postoperative analgesia.  The operative drapes were broken down.  The patient was awoken from anesthesia safely.  She was transferred back to stretcher and  taken to PACU in stable condition.  The tourniquet had been deflated at 22 minutes.  I will give her Tylenol with Codeine as needed for pain.  I will see her back in 1 week for postoperative followup.     Leanora Cover, MD     KK/MEDQ  D:  09/25/2014  T:  09/25/2014  Job:  [id number]

## 2014-09-25 NOTE — Anesthesia Procedure Notes (Addendum)
Anesthesia Regional Block:  Bier block (IV Regional)  Pre-Anesthetic Checklist: ,, timeout performed, Correct Patient, Correct Site, Correct Laterality, Correct Procedure,, site marked, surgical consent,, at surgeon's request Needles:  Injection technique: Single-shot  Needle Type: Other      Needle Gauge: 20 and 20 G    Additional Needles: Bier block (IV Regional) Narrative:   Performed by: Personally    Procedure Name: MAC, LMA Insertion, Intubation and Awake intubation Date/Time: 09/25/2014 10:05 AM Performed by: Neshia Mckenzie Pre-anesthesia Checklist: Patient identified Patient Re-evaluated:Patient Re-evaluated prior to inductionOxygen Delivery Method: Simple face mask

## 2014-09-25 NOTE — Op Note (Signed)
[id number]  

## 2014-09-25 NOTE — Brief Op Note (Signed)
09/25/2014  10:39 AM  PATIENT:  Loraine Maple  73 y.o. female  PRE-OPERATIVE DIAGNOSIS:  right ring finger mucoid cyst and DIP arthrosis  POST-OPERATIVE DIAGNOSIS:  right ring finger mucoid cyst and DIP arthrosis  PROCEDURE:  Procedure(s): RIGHT RING FINGER EXCISION MASS AND DEBRIDEMENT DIP JOINT (Right)  SURGEON:  Surgeon(s) and Role:    * Leanora Cover, MD - Primary  PHYSICIAN ASSISTANT:   ASSISTANTS: none   ANESTHESIA:   Bier block with sedation  EBL:  Total I/O In: 900 [I.V.:900] Out: -   BLOOD ADMINISTERED:none  DRAINS: none   LOCAL MEDICATIONS USED:  MARCAINE     SPECIMEN:  Source of Specimen:  right ring finger  DISPOSITION OF SPECIMEN:  PATHOLOGY  COUNTS:  YES  TOURNIQUET:   Total Tourniquet Time Documented: Forearm (Right) - 24 minutes Total: Forearm (Right) - 24 minutes   DICTATION: .Other Dictation: Dictation Number [id number]  PLAN OF CARE: Discharge to home after PACU  PATIENT DISPOSITION:  PACU - hemodynamically stable.

## 2014-09-26 ENCOUNTER — Encounter (HOSPITAL_BASED_OUTPATIENT_CLINIC_OR_DEPARTMENT_OTHER): Payer: Self-pay | Admitting: Orthopedic Surgery

## 2014-09-30 ENCOUNTER — Telehealth: Payer: Self-pay | Admitting: *Deleted

## 2014-09-30 DIAGNOSIS — E876 Hypokalemia: Secondary | ICD-10-CM

## 2014-09-30 NOTE — Telephone Encounter (Signed)
Patient was advised on 11/10 to increase intake of potassium rich foods -- she tells me she did this and has gained 5 pds. Agreed to redraw lab next week, if K+ still low will discuss adding potassium supplement. Patient verbalized understanding and agreeable to plan.

## 2014-09-30 NOTE — Telephone Encounter (Signed)
-----   Message from [facility] sent at 09/23/2014  3:34 PM EST ----- Regarding: Low Potassium Hey Sherrie,  Latest potassium shows 3.3. Pt wants to know if she needs prescription and would like a return call.   Thanks, Juanda Crumble

## 2014-10-01 ENCOUNTER — Ambulatory Visit: Payer: Medicare Other

## 2014-10-03 ENCOUNTER — Other Ambulatory Visit (INDEPENDENT_AMBULATORY_CARE_PROVIDER_SITE_OTHER): Payer: Medicare Other | Admitting: *Deleted

## 2014-10-03 DIAGNOSIS — E876 Hypokalemia: Secondary | ICD-10-CM

## 2014-10-03 LAB — BASIC METABOLIC PANEL
BUN: 12 mg/dL (ref 6–23)
CALCIUM: 9.1 mg/dL (ref 8.4–10.5)
CO2: 29 mEq/L (ref 19–32)
Chloride: 101 mEq/L (ref 96–112)
Creatinine, Ser: 0.5 mg/dL (ref 0.4–1.2)
GFR: 118.02 mL/min (ref >60.00)
Glucose, Bld: 89 mg/dL (ref 70–99)
POTASSIUM: 3.4 meq/L — AB (ref 3.5–5.1)
Sodium: 137 mEq/L (ref 135–145)

## 2014-10-05 ENCOUNTER — Encounter (HOSPITAL_COMMUNITY): Payer: Self-pay | Admitting: *Deleted

## 2014-10-08 ENCOUNTER — Other Ambulatory Visit: Payer: Medicare Other

## 2014-10-15 ENCOUNTER — Encounter: Payer: Self-pay | Admitting: Internal Medicine

## 2014-10-15 ENCOUNTER — Ambulatory Visit (INDEPENDENT_AMBULATORY_CARE_PROVIDER_SITE_OTHER): Payer: Medicare Other | Admitting: *Deleted

## 2014-10-15 DIAGNOSIS — R002 Palpitations: Secondary | ICD-10-CM | POA: Diagnosis not present

## 2014-10-17 ENCOUNTER — Encounter: Payer: Self-pay | Admitting: Internal Medicine

## 2014-10-22 NOTE — Progress Notes (Signed)
Loop recorder 

## 2014-10-23 ENCOUNTER — Encounter (HOSPITAL_COMMUNITY): Payer: Self-pay | Admitting: Internal Medicine

## 2014-10-27 ENCOUNTER — Telehealth: Payer: Self-pay | Admitting: Internal Medicine

## 2014-10-27 NOTE — Telephone Encounter (Signed)
Pt has appt with Dr Debara Pickett on 12-16 and wanted to make sure Dr Debara Pickett will see LINQ reports.

## 2014-10-27 NOTE — Telephone Encounter (Signed)
Please call,question about her loop recorder

## 2014-10-27 NOTE — Telephone Encounter (Signed)
Please call,question about her loop recorder.wrong provider

## 2014-10-29 ENCOUNTER — Encounter: Payer: Self-pay | Admitting: Internal Medicine

## 2014-10-29 ENCOUNTER — Ambulatory Visit (INDEPENDENT_AMBULATORY_CARE_PROVIDER_SITE_OTHER): Payer: Medicare Other | Admitting: Internal Medicine

## 2014-10-29 ENCOUNTER — Other Ambulatory Visit: Payer: Self-pay | Admitting: *Deleted

## 2014-10-29 VITALS — BP 100/60 | HR 54 | Ht 64.0 in | Wt 121.0 lb

## 2014-10-29 DIAGNOSIS — I10 Essential (primary) hypertension: Secondary | ICD-10-CM | POA: Diagnosis not present

## 2014-10-29 DIAGNOSIS — I471 Supraventricular tachycardia: Secondary | ICD-10-CM | POA: Diagnosis not present

## 2014-10-29 DIAGNOSIS — Z79899 Other long term (current) drug therapy: Secondary | ICD-10-CM | POA: Diagnosis not present

## 2014-10-29 DIAGNOSIS — E876 Hypokalemia: Secondary | ICD-10-CM

## 2014-10-29 DIAGNOSIS — R002 Palpitations: Secondary | ICD-10-CM | POA: Diagnosis not present

## 2014-10-29 NOTE — Progress Notes (Signed)
OFFICE NOTE  Chief Complaint:  Palpitations  Primary Care Physician: Florina Ou, MD  HPI:  Kelsey James is a pleasant 73 year old female with little past medical history. For years she's had very low blood pressure however recently her blood pressure has spiked up into the upper 180's to over 696 systolic. She recently presented to the emergency room and any pain with symptoms such as throbbing in her neck and head with uncontrolled blood pressure. Subsequently she was started on low-dose amlodipine and she's had a nice improvement in her blood pressure. She does however report lower extremity swelling in her feet. She also describes what I believe is chest tightness. She actually says that she feels that her chest is "caving in" or "deflating". There is some associated sporadic shortness of breath but it has improved with her blood pressure medicine. She does do some activity and exercise and reports that her symptoms improve with exercise.  [name] had a recent nuclear stress test which demonstrated reversible distal anteroapical and anterolateral reversible ischemia. EF of 69%.  I interpreted as intermediate risk due to the distal location of the ischemia however is abnormal. She is relatively thin and I did not suspect this is due to attenuation artifact.  Ultimately she was referred for cardiac catheterization.  This was performed by myself and demonstrated normal coronary arteries. Subsequently she been having palpitations and I arranged for a monitor. She wore that monitor her for 2 weeks between 03/28/2014 and 04/11/2014. Total monitoring time was 288 hours and 50 minutes. Lowest heart rate was 41 and average heart rate was 59. She was noted to have PACs, short runs of PSVT up to 14 beats were also noted.  This likely explains her palpitations.  Given her bradycardia, I recommended discontinuing her timolol, which is the only medication that I can see which could  have been causing her bradycardia. She now presents today as a walk-in with feelings of her heart thumping. She was playing progressive bridge today and she started having heaviness in her chest. She felt like her heart was racing and she was short of breath, dizzy and like she was going to pass out. An EKG was performed in the office today which showed normal sinus rhythm at 71. Blood pressure was normal.  [name] returns today for follow-up. In the interim she is seeing Dr. Jolyn Nap for evaluation of her palpitations and arrhythmia. She was felt to have 2 separate items. The first being palpitations and an ectopic atrial tachycardia with PACs which he felt were most likely benign given her normal coronary anatomy. The second was concerning more for atrial fibrillation, which relates to the episodes where she feels like her chest is "caving in". I discussed multiple ways to try to evaluate for arrhythmias, but given the infrequency of the events, ultimately he recommended an implantable loop recorder. She did undergo successful loop recorder placement and has had 2 transmissions that I reviewed, neither of which have demonstrated any atrial fibrillation. She continues to have some palpitations. She does related somewhat to caffeine and particular chocolate. She reports eating moose tracks ice cream every evening. I did ask that she may want to decrease some of her chocolate intake. Recently she's had problems with hypokalemia. We will plan to recheck a BMET today.  PMHx:  Past Medical History  Diagnosis Date  . Glaucoma   . Rosacea   .  Dry eyes   . Arthritis   . Heart murmur   . Atrial ectopy   . Wears glasses   . History of loop recorder     Past Surgical History  Procedure Laterality Date  . Tonsillectomy and adenoidectomy  age 76  . Tubal ligation    . Colonoscopy    . Mass excision Right 09/25/2014    Procedure: RIGHT RING FINGER EXCISION MASS AND DEBRIDEMENT DIP JOINT;  Surgeon:  Leanora Cover, MD;  Location: [facility];  Service: Orthopedics;  Laterality: Right;  . Loop recorder implant  09/14/2014    MDT LINQ implanted by Dr Caryl Comes for palpitations  . Left heart catheterization with coronary angiogram N/A 03/12/2014    Procedure: LEFT HEART CATHETERIZATION WITH CORONARY ANGIOGRAM;  Surgeon: Pixie Casino, MD;  Location: [facility] CATH LAB;  Service: Cardiovascular;  Laterality: N/A;  . Loop recorder implant N/A 09/15/2014    Procedure: LOOP RECORDER IMPLANT;  Surgeon: Deboraha Sprang, MD;  Location: [facility] CATH LAB;  Service: Cardiovascular;  Laterality: N/A;    FAMHx:  Family History  Problem Relation Age of Onset  . Valvular heart disease Mother   . Cancer Maternal Grandmother   . Depression Father     SOCHx:   reports that she quit smoking about 11 years ago. Her smoking use included Cigarettes. She has a 12.5 pack-year smoking history. She has never used smokeless tobacco. She reports that she drinks about 4.2 oz of alcohol per week. She reports that she does not use illicit drugs.  ALLERGIES:  Allergies  Allergen Reactions  . Other Nausea And Vomiting    Mushrooms   . Shellfish Allergy Nausea And Vomiting    ROS: A comprehensive review of systems was negative except for: Cardiovascular: positive for palpitations  HOME MEDS: Current Outpatient Prescriptions  Medication Sig Dispense Refill  . acetaminophen (TYLENOL) 500 MG tablet Take 500-1,000 mg by mouth every 6 (six) hours as needed (pain).    Marland Kitchen amLODipine (NORVASC) 5 MG tablet Take 5 mg by mouth daily.    Marland Kitchen aspirin 325 MG tablet Take 325-650 mg by mouth daily as needed for headache (pain).    Marland Kitchen b complex vitamins capsule Take 1 capsule by mouth daily.    . Biotin 1000 MCG tablet Take 1,000 mcg by mouth daily.    . Calcium-Magnesium (CALCIUM MAGNESIUM 750) 300-300 MG TABS Take 2 tablets by mouth 2 (two) times daily.     . Cholecalciferol (VITAMIN D3 PO) Take 1 tablet by mouth daily.    .  cycloSPORINE (RESTASIS) 0.05 % ophthalmic emulsion Place 1 drop into both eyes 2 (two) times daily.    . hydroxypropyl methylcellulose (ISOPTO TEARS) 2.5 % ophthalmic solution Place 1 drop into both eyes as needed for dry eyes.    Marland Kitchen LECITHIN PO Take 1 capsule by mouth daily.    . LUTEIN PO Take 1 capsule by mouth daily.    Marland Kitchen MAGNESIUM PO Take 2 tablets by mouth 2 (two) times daily.     . Multiple Vitamins-Minerals (ANTIOXIDANT PO) Take 1 capsule by mouth 2 (two) times daily. BioAstin    . Omega-3 Fatty Acids (FISH OIL PO) Take 1 capsule by mouth 2 (two) times daily.     Marland Kitchen OVER THE COUNTER MEDICATION Take 1 capsule by mouth 2 (two) times daily. Vegetal Silica    . OVER THE COUNTER MEDICATION Take 1 capsule by mouth 2 (two) times daily. Perfect Eyes supplement    .  Tafluprost (ZIOPTAN) 0.0015 % SOLN Place 1 drop into both eyes daily.      No current facility-administered medications for this visit.    LABS/IMAGING: No results found for this or any previous visit (from the past 48 hour(s)). No results found.  VITALS: BP 100/60 mmHg  Pulse 54  Ht 5\' 4"  (1.626 m)  Wt 121 lb (54.885 kg)  BMI 20.76 kg/m2  EXAM: deferred  EKG: SInus rhythm at 71  ASSESSMENT: 1. Hypertension - BP now normalized on amlodipine 2. Chest pressure - normal coronary arteries by cardiac cath 3. Bradycardia - improved off timolol, s/p loop recorder implant 4. Dyspnea and exertion 5. Palpitations 6. Dizziness -presyncope 7. Hypokalemia  PLAN: 1.   Mrs. Goodspeed is status post loop recorder placement. Hopefully this will be revealing as to whether or not she has intermittent A. fib. She continues to have some palpitations and they may be related some part to caffeine intake. I've asked her to work on trying to decrease that. Blood pressure is normalized today on amlodipine and we will continue current medications. Plan to see her back in 6 months or sooner if the need dictates based on her recorder.  Pixie Casino, MD, [facility] Attending Cardiologist [facility]  HILTY,Kenneth C 10/29/2014, 3:48 PM

## 2014-10-29 NOTE — Patient Instructions (Signed)
Please have labs [place] wants you to follow-up in: 6 months. You will receive a reminder letter in the mail two months in advance. If you don't receive a letter, please call our office to schedule the follow-up appointment.

## 2014-10-30 ENCOUNTER — Telehealth: Payer: Self-pay | Admitting: *Deleted

## 2014-10-30 LAB — BASIC METABOLIC PANEL
BUN: 8 mg/dL (ref 6–23)
CO2: 29 mEq/L (ref 19–32)
Calcium: 9.1 mg/dL (ref 8.4–10.5)
Chloride: 102 mEq/L (ref 96–112)
Creat: 0.45 mg/dL — ABNORMAL LOW (ref 0.50–1.10)
Glucose, Bld: 76 mg/dL (ref 70–99)
POTASSIUM: 3.6 meq/L (ref 3.5–5.3)
Sodium: 142 mEq/L (ref 135–145)

## 2014-10-30 NOTE — Telephone Encounter (Signed)
-----   Message from Pixie Casino, MD sent at 10/30/2014  9:46 AM EST ----- Potassium is now within normal limits. No changes.  [place]

## 2014-10-30 NOTE — Telephone Encounter (Signed)
LMTCB

## 2014-10-31 ENCOUNTER — Telehealth: Payer: Self-pay | Admitting: Internal Medicine

## 2014-10-31 NOTE — Telephone Encounter (Signed)
PT WANTS TO KNOW IF SHE CAN UNPLUG HER BOX AND MOVE TO ANOTHER LOCATION WITHOUT LOSING ANY INFORMATION, HAS LOOP

## 2014-10-31 NOTE — Telephone Encounter (Signed)
Informed pt that she can movie home monitor w/ out monitor becoming disconnected. But if she unplugs monitor and leaves it unplugged for a day or so that monitor will become disconnected then she will have to plug monitor back in and send a manual transmission. Pt verbalized understanding.

## 2014-11-03 DIAGNOSIS — H4053X3 Glaucoma secondary to other eye disorders, bilateral, severe stage: Secondary | ICD-10-CM | POA: Diagnosis not present

## 2014-11-03 DIAGNOSIS — H40043 Steroid responder, bilateral: Secondary | ICD-10-CM | POA: Diagnosis not present

## 2014-11-04 NOTE — Progress Notes (Signed)
LVM 12/22

## 2014-11-10 ENCOUNTER — Ambulatory Visit (INDEPENDENT_AMBULATORY_CARE_PROVIDER_SITE_OTHER): Payer: Medicare Other | Admitting: *Deleted

## 2014-11-10 ENCOUNTER — Encounter: Payer: Self-pay | Admitting: Internal Medicine

## 2014-11-10 DIAGNOSIS — R002 Palpitations: Secondary | ICD-10-CM | POA: Diagnosis not present

## 2014-11-10 LAB — MDC_IDC_ENUM_SESS_TYPE_REMOTE: MDC IDC SESS DTM: 20151228050500

## 2014-11-11 ENCOUNTER — Encounter: Payer: Self-pay | Admitting: Internal Medicine

## 2014-11-12 ENCOUNTER — Encounter: Payer: Self-pay | Admitting: Internal Medicine

## 2014-11-13 ENCOUNTER — Encounter: Payer: Self-pay | Admitting: Internal Medicine

## 2014-11-17 ENCOUNTER — Encounter: Payer: Self-pay | Admitting: Internal Medicine

## 2014-11-21 NOTE — Progress Notes (Signed)
Loop recorder 

## 2014-12-11 ENCOUNTER — Encounter: Payer: Self-pay | Admitting: Internal Medicine

## 2014-12-12 ENCOUNTER — Encounter: Payer: Self-pay | Admitting: Internal Medicine

## 2014-12-12 LAB — MDC_IDC_ENUM_SESS_TYPE_REMOTE: MDC IDC SESS DTM: 20160129050500

## 2014-12-15 ENCOUNTER — Ambulatory Visit (INDEPENDENT_AMBULATORY_CARE_PROVIDER_SITE_OTHER): Payer: Medicare Other | Admitting: *Deleted

## 2014-12-15 DIAGNOSIS — R002 Palpitations: Secondary | ICD-10-CM | POA: Diagnosis not present

## 2014-12-16 NOTE — Progress Notes (Signed)
Loop recorder 

## 2014-12-22 ENCOUNTER — Encounter: Payer: Self-pay | Admitting: Internal Medicine

## 2014-12-23 ENCOUNTER — Encounter: Payer: Self-pay | Admitting: Internal Medicine

## 2014-12-23 ENCOUNTER — Ambulatory Visit (INDEPENDENT_AMBULATORY_CARE_PROVIDER_SITE_OTHER): Payer: Medicare Other | Admitting: Internal Medicine

## 2014-12-23 VITALS — BP 124/62 | HR 49 | Ht 64.0 in | Wt 120.8 lb

## 2014-12-23 DIAGNOSIS — Z4509 Encounter for adjustment and management of other cardiac device: Secondary | ICD-10-CM | POA: Diagnosis not present

## 2014-12-23 DIAGNOSIS — I471 Supraventricular tachycardia: Secondary | ICD-10-CM

## 2014-12-23 LAB — MDC_IDC_ENUM_SESS_TYPE_INCLINIC

## 2014-12-23 NOTE — Patient Instructions (Signed)
Your physician recommends that you continue on your current medications as directed. Please refer to the Current Medication list given to you today.  Your physician wants you to follow-up in: 9 months with Dr. Caryl Comes.  You will receive a reminder letter in the mail two months in advance. If you don't receive a letter, please call our office to schedule the follow-up appointment.

## 2014-12-23 NOTE — Progress Notes (Signed)
Patient Care Team: Florina Ou, MD as PCP - General (Family Medicine)   HPI  Kelsey James is a 74 y.o. female Seen in followup for palpitations correlated with PACs and atrial runs. The other concern was whether these were related to atrial fibrillation and she and I discussed the use of monitoring and she elected to get [employer].  Afterwards, she simply got the new phone and comes in today to decide about different types of monitoring  Sjhe has had few symptoms  Past Medical History  Diagnosis Date  . Glaucoma   . Rosacea   . Dry eyes   . Arthritis   . Heart murmur   . Atrial ectopy   . Wears glasses   . History of loop recorder     Past Surgical History  Procedure Laterality Date  . Tonsillectomy and adenoidectomy  age 4  . Tubal ligation    . Colonoscopy    . Mass excision Right 09/25/2014    Procedure: RIGHT RING FINGER EXCISION MASS AND DEBRIDEMENT DIP JOINT;  Surgeon: Leanora Cover, MD;  Location: [facility];  Service: Orthopedics;  Laterality: Right;  . Loop recorder implant  09/14/2014    MDT LINQ implanted by Dr Caryl Comes for palpitations  . Left heart catheterization with coronary angiogram N/A 03/12/2014    Procedure: LEFT HEART CATHETERIZATION WITH CORONARY ANGIOGRAM;  Surgeon: Pixie Casino, MD;  Location: [facility] CATH LAB;  Service: Cardiovascular;  Laterality: N/A;  . Loop recorder implant N/A 09/15/2014    Procedure: LOOP RECORDER IMPLANT;  Surgeon: Deboraha Sprang, MD;  Location: [facility] CATH LAB;  Service: Cardiovascular;  Laterality: N/A;    Current Outpatient Prescriptions  Medication Sig Dispense Refill  . acetaminophen (TYLENOL) 500 MG tablet Take 500-1,000 mg by mouth every 6 (six) hours as needed (pain).    Marland Kitchen amLODipine (NORVASC) 5 MG tablet Take 5 mg by mouth daily.    Marland Kitchen aspirin 325 MG tablet Take 325-650 mg by mouth daily as needed for headache (pain).    Marland Kitchen b complex vitamins capsule Take 1 capsule by mouth daily.    . Biotin 1000 MCG  tablet Take 1,000 mcg by mouth daily.    . Calcium-Magnesium (CALCIUM MAGNESIUM 750) 300-300 MG TABS Take 2 tablets by mouth 2 (two) times daily.     . Cholecalciferol (VITAMIN D3 PO) Take 1 tablet by mouth daily.    . cycloSPORINE (RESTASIS) 0.05 % ophthalmic emulsion Place 1 drop into both eyes 2 (two) times daily.    . hydroxypropyl methylcellulose (ISOPTO TEARS) 2.5 % ophthalmic solution Place 1 drop into both eyes as needed for dry eyes.    Marland Kitchen LECITHIN PO Take 1 capsule by mouth daily.    . LUTEIN PO Take 1 capsule by mouth daily.    Marland Kitchen MAGNESIUM PO Take 2 tablets by mouth 2 (two) times daily.     . Multiple Vitamins-Minerals (ANTIOXIDANT PO) Take 1 capsule by mouth 2 (two) times daily. BioAstin    . Omega-3 Fatty Acids (FISH OIL PO) Take 1 capsule by mouth 2 (two) times daily.     Marland Kitchen OVER THE COUNTER MEDICATION Take 1 capsule by mouth 2 (two) times daily. Vegetal Silica    . OVER THE COUNTER MEDICATION Take 1 capsule by mouth 2 (two) times daily. Perfect Eyes supplement    . Tafluprost (ZIOPTAN) 0.0015 % SOLN Place 1 drop into both eyes daily.      No current facility-administered medications  for this visit.    Allergies  Allergen Reactions  . Other Nausea And Vomiting    Mushrooms   . Shellfish Allergy Nausea And Vomiting    Review of Systems negative except from HPI and PMH  Physical Exam BP 124/62 mmHg  Pulse 49  Ht 5\' 4"  (1.626 m)  Wt 120 lb 12.8 oz (54.795 kg)  BMI 20.73 kg/m2 Well developed and well nourished in no acute distress HENT normal E scleral and icterus clear  No clubbing cyanosis  Edema Alert and oriented, grossly normal motor and sensory function Skin Warm and Dry    Assessment and  Plan Atrial ectopy.  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

## 2014-12-25 ENCOUNTER — Encounter: Payer: Self-pay | Admitting: Internal Medicine

## 2015-01-02 DIAGNOSIS — H5052 Exophoria: Secondary | ICD-10-CM | POA: Diagnosis not present

## 2015-01-02 DIAGNOSIS — H4011X2 Primary open-angle glaucoma, moderate stage: Secondary | ICD-10-CM | POA: Diagnosis not present

## 2015-01-07 ENCOUNTER — Encounter: Payer: Self-pay | Admitting: Gynecology

## 2015-01-07 ENCOUNTER — Ambulatory Visit (INDEPENDENT_AMBULATORY_CARE_PROVIDER_SITE_OTHER): Payer: Medicare Other | Admitting: Gynecology

## 2015-01-07 VITALS — BP 128/76 | Ht 63.5 in | Wt 121.0 lb

## 2015-01-07 DIAGNOSIS — Z01419 Encounter for gynecological examination (general) (routine) without abnormal findings: Secondary | ICD-10-CM

## 2015-01-07 DIAGNOSIS — G4700 Insomnia, unspecified: Secondary | ICD-10-CM

## 2015-01-07 DIAGNOSIS — M8580 Other specified disorders of bone density and structure, unspecified site: Secondary | ICD-10-CM | POA: Diagnosis not present

## 2015-01-07 MED ORDER — ALPRAZOLAM 0.25 MG PO TABS: 0.2500 mg | ORAL_TABLET | Freq: Every evening | ORAL | Status: DC | PRN

## 2015-01-07 NOTE — Progress Notes (Signed)
Kelsey James [birth year] [id number]   History:    74 y.o.  for annual gyn exam who has not been seen in the office since 2009. Her primary care physician has been Dr. Greta Doom has been doing her blood work. Patient has recently been seeing Dr. Virl Axe cardiologist for recurrent episodes of SVT and is currently being monitored.  Patient denies any past history of any abnormal Pap smears. Her last mammogram reportedly in 2007. She stated she had a colonoscopy over 12 years ago and had benign polyps removed and has not had any follow-up. She has history of osteopenia and the last bone density study that I have a record of indicated that her lowest T score was at the left femoral neck with a value of -1.3 at which time Frax analysis had not been done. She stated she had another one done a few years ago and is scheduled to have one in the next few weeks along with her mammogram. Patient reports that in 1995 she had shingles and declines the vaccine. She believes her flu vaccine and Tdap vaccines are up-to-date. She does suffer from insomnia and has tried several medications in the past. She states what has worked best has been half a tablet of Xanax 0.25 mg only when needed.  The only hormone replacement therapy that we have on record was that in the past she used Vagifem for her vaginal atrophy.  Past medical history,surgical history, family history and social history were all reviewed and documented in the EPIC chart.  Gynecologic History No LMP recorded. Patient is postmenopausal. Contraception: post menopausal status Last Pap: 2009. Results were: normal Last mammogram: 2007. Results were: normal  Obstetric History OB History  Gravida Para Term Preterm AB SAB TAB Ectopic Multiple Living  2 2  2      2     # Outcome Date GA Lbr Len/2nd Weight Sex Delivery Anes PTL Lv  2 Preterm           1 Preterm                ROS: A ROS was performed and pertinent positives and negatives are  included in the history.  GENERAL: No fevers or chills. HEENT: No change in vision, no earache, sore throat or sinus congestion. NECK: No pain or stiffness. CARDIOVASCULAR: No chest pain or pressure. No palpitations. PULMONARY: No shortness of breath, cough or wheeze. GASTROINTESTINAL: No abdominal pain, nausea, vomiting or diarrhea, melena or bright red blood per rectum. GENITOURINARY: No urinary frequency, urgency, hesitancy or dysuria. MUSCULOSKELETAL: No joint or muscle pain, no back pain, no recent trauma. DERMATOLOGIC: No rash, no itching, no lesions. ENDOCRINE: No polyuria, polydipsia, no heat or cold intolerance. No recent change in weight. HEMATOLOGICAL: No anemia or easy bruising or bleeding. NEUROLOGIC: No headache, seizures, numbness, tingling or weakness. PSYCHIATRIC: No depression, no loss of interest in normal activity or change in sleep pattern.     Exam: chaperone present  BP 128/76 mmHg  Ht 5' 3.5" (1.613 m)  Wt 121 lb (54.885 kg)  BMI 21.10 kg/m2  Body mass index is 21.1 kg/(m^2).  General appearance : Well developed well nourished female. No acute distress HEENT: Eyes: no retinal hemorrhage or exudates,  Neck supple, trachea midline, no carotid bruits, no thyroidmegaly Lungs: Clear to auscultation, no rhonchi or wheezes, or rib retractions  Heart: Regular rate and rhythm, no murmurs or gallops Breast:Examined in sitting and supine position were symmetrical in appearance, no palpable  masses or tenderness,  no skin retraction, no nipple inversion, no nipple discharge, no skin discoloration, no axillary or supraclavicular lymphadenopathy Abdomen: no palpable masses or tenderness, no rebound or guarding Extremities: no edema or skin discoloration or tenderness  Pelvic:  Bartholin, Urethra, Skene Glands: Within normal limits             Vagina: No gross lesions or discharge, atrophic changes  Cervix: No gross lesions or discharge  Uterus  axial, normal size, shape and  consistency, non-tender and mobile  Adnexa  Without masses or tenderness  Anus and perineum  normal   Rectovaginal  normal sphincter tone without palpated masses or tenderness             Hemoccult refused this a she will have her colonoscopy this year.     Assessment/Plan:  74 y.o. female for annual exam who suffers that time from insomnia and has responded well to low-dose Xanax. She will be prescribed 0.25 mg to take half a tablet when necessary. Patient with no past history of abnormal Pap smear according to the new guidelines she will not have another Pap smear. She was reminded to schedule her bone density study and mammogram. She will make an appointment to see me to discuss her bone density study once it is done. She will be referred to the gastroenterologist for a screening colonoscopy that is overdue especially with her past history of benign colon polyps. Blood work to be drawn by her primary care physician.   Terrance Mass MD, 2:46 PM 01/07/2015

## 2015-01-13 ENCOUNTER — Encounter: Payer: Self-pay | Admitting: Internal Medicine

## 2015-01-13 ENCOUNTER — Ambulatory Visit (INDEPENDENT_AMBULATORY_CARE_PROVIDER_SITE_OTHER): Payer: Medicare Other | Admitting: *Deleted

## 2015-01-13 DIAGNOSIS — R002 Palpitations: Secondary | ICD-10-CM

## 2015-01-13 LAB — MDC_IDC_ENUM_SESS_TYPE_REMOTE: MDC IDC SESS DTM: 20160302050500

## 2015-01-15 NOTE — Progress Notes (Signed)
Loop recorder 

## 2015-01-19 ENCOUNTER — Encounter: Payer: Self-pay | Admitting: Gynecology

## 2015-01-21 DIAGNOSIS — H60391 Other infective otitis externa, right ear: Secondary | ICD-10-CM | POA: Diagnosis not present

## 2015-01-27 ENCOUNTER — Encounter: Payer: Self-pay | Admitting: Internal Medicine

## 2015-01-28 ENCOUNTER — Encounter: Payer: Self-pay | Admitting: Internal Medicine

## 2015-01-29 ENCOUNTER — Other Ambulatory Visit: Payer: Self-pay | Admitting: Gynecology

## 2015-01-29 ENCOUNTER — Encounter: Payer: Self-pay | Admitting: Gynecology

## 2015-01-29 DIAGNOSIS — M8580 Other specified disorders of bone density and structure, unspecified site: Secondary | ICD-10-CM

## 2015-01-30 ENCOUNTER — Encounter: Payer: Self-pay | Admitting: Internal Medicine

## 2015-02-02 ENCOUNTER — Encounter: Payer: Self-pay | Admitting: Internal Medicine

## 2015-02-03 ENCOUNTER — Encounter: Payer: Self-pay | Admitting: Internal Medicine

## 2015-02-04 DIAGNOSIS — H4053X3 Glaucoma secondary to other eye disorders, bilateral, severe stage: Secondary | ICD-10-CM | POA: Diagnosis not present

## 2015-02-04 DIAGNOSIS — H40043 Steroid responder, bilateral: Secondary | ICD-10-CM | POA: Diagnosis not present

## 2015-02-06 DIAGNOSIS — H4011X2 Primary open-angle glaucoma, moderate stage: Secondary | ICD-10-CM | POA: Diagnosis not present

## 2015-02-10 ENCOUNTER — Encounter: Payer: Self-pay | Admitting: Internal Medicine

## 2015-02-12 ENCOUNTER — Ambulatory Visit (INDEPENDENT_AMBULATORY_CARE_PROVIDER_SITE_OTHER): Payer: Medicare Other | Admitting: *Deleted

## 2015-02-12 DIAGNOSIS — R002 Palpitations: Secondary | ICD-10-CM

## 2015-02-13 NOTE — Progress Notes (Signed)
Loop recorder 

## 2015-02-16 ENCOUNTER — Encounter: Payer: Self-pay | Admitting: Internal Medicine

## 2015-02-17 ENCOUNTER — Encounter: Payer: Self-pay | Admitting: Internal Medicine

## 2015-02-18 ENCOUNTER — Other Ambulatory Visit: Payer: Self-pay

## 2015-02-24 ENCOUNTER — Encounter: Payer: Self-pay | Admitting: Internal Medicine

## 2015-02-26 DIAGNOSIS — R1011 Right upper quadrant pain: Secondary | ICD-10-CM | POA: Diagnosis not present

## 2015-02-26 DIAGNOSIS — Z1211 Encounter for screening for malignant neoplasm of colon: Secondary | ICD-10-CM | POA: Diagnosis not present

## 2015-02-27 ENCOUNTER — Other Ambulatory Visit: Payer: Self-pay | Admitting: Gastroenterology

## 2015-02-27 DIAGNOSIS — R1011 Right upper quadrant pain: Secondary | ICD-10-CM

## 2015-03-03 LAB — MDC_IDC_ENUM_SESS_TYPE_REMOTE: Date Time Interrogation Session: 20160331040500

## 2015-03-12 ENCOUNTER — Encounter: Payer: Self-pay | Admitting: Internal Medicine

## 2015-03-13 ENCOUNTER — Ambulatory Visit (INDEPENDENT_AMBULATORY_CARE_PROVIDER_SITE_OTHER): Payer: Medicare Other | Admitting: *Deleted

## 2015-03-13 ENCOUNTER — Encounter: Payer: Self-pay | Admitting: Internal Medicine

## 2015-03-13 DIAGNOSIS — R002 Palpitations: Secondary | ICD-10-CM

## 2015-03-16 ENCOUNTER — Encounter: Payer: Self-pay | Admitting: Internal Medicine

## 2015-03-17 ENCOUNTER — Encounter: Payer: Self-pay | Admitting: Internal Medicine

## 2015-03-17 ENCOUNTER — Encounter: Payer: Medicare Other | Admitting: Internal Medicine

## 2015-03-18 ENCOUNTER — Ambulatory Visit (INDEPENDENT_AMBULATORY_CARE_PROVIDER_SITE_OTHER): Payer: Medicare Other | Admitting: Internal Medicine

## 2015-03-18 ENCOUNTER — Encounter: Payer: Self-pay | Admitting: Internal Medicine

## 2015-03-18 VITALS — BP 110/62 | HR 53 | Ht 64.5 in | Wt 120.0 lb

## 2015-03-18 DIAGNOSIS — I471 Supraventricular tachycardia: Secondary | ICD-10-CM

## 2015-03-18 LAB — CUP PACEART INCLINIC DEVICE CHECK: Date Time Interrogation Session: 20160504162806

## 2015-03-18 MED ORDER — FLECAINIDE ACETATE 50 MG PO TABS: 50.0000 mg | ORAL_TABLET | Freq: Two times a day (BID) | ORAL | Status: DC

## 2015-03-18 NOTE — Progress Notes (Signed)
Patient Care Team: Florina Ou, MD as PCP - General (Family Medicine)   HPI  Kelsey James is a 74 y.o. female Seen in followup for palpitations correlated with PACs and atrial runs. She decided after multiple discussions undergo Linq recorder implantation. She is seen today because of an episode of presyncope associated with a tachycardia identified on her monitor.  Myoview scanning 3/15 was reviewed; this was abnormal. She was thus admitted for catheterization 4/15 which was normal.  Past Medical History  Diagnosis Date  . Glaucoma   . Rosacea   . Dry eyes   . Arthritis   . Heart murmur   . Atrial ectopy   . Wears glasses   . History of loop recorder   . Osteopenia     Past Surgical History  Procedure Laterality Date  . Tonsillectomy and adenoidectomy  age 26  . Tubal ligation    . Colonoscopy    . Mass excision Right 09/25/2014    Procedure: RIGHT RING FINGER EXCISION MASS AND DEBRIDEMENT DIP JOINT;  Surgeon: Leanora Cover, MD;  Location: [facility];  Service: Orthopedics;  Laterality: Right;  . Loop recorder implant  09/14/2014    MDT LINQ implanted by Dr Caryl Comes for palpitations  . Left heart catheterization with coronary angiogram N/A 03/12/2014    Procedure: LEFT HEART CATHETERIZATION WITH CORONARY ANGIOGRAM;  Surgeon: Pixie Casino, MD;  Location: [facility] CATH LAB;  Service: Cardiovascular;  Laterality: N/A;  . Loop recorder implant N/A 09/15/2014    Procedure: LOOP RECORDER IMPLANT;  Surgeon: Deboraha Sprang, MD;  Location: [facility] CATH LAB;  Service: Cardiovascular;  Laterality: N/A;    Current Outpatient Prescriptions  Medication Sig Dispense Refill  . acetaminophen (TYLENOL) 500 MG tablet Take 500-1,000 mg by mouth every 6 (six) hours as needed (pain).    Marland Kitchen ALPRAZolam (XANAX) 0.25 MG tablet Take 1 tablet (0.25 mg total) by mouth at bedtime as needed for anxiety. 30 tablet 1  . amLODipine (NORVASC) 5 MG tablet Take 5 mg by mouth daily.    Marland Kitchen aspirin  325 MG tablet Take 325-650 mg by mouth daily as needed for headache (pain).    Marland Kitchen b complex vitamins capsule Take 1 capsule by mouth daily.    . Biotin 1000 MCG tablet Take 1,000 mcg by mouth daily.    . Calcium-Magnesium (CALCIUM MAGNESIUM 750) 300-300 MG TABS Take 2 tablets by mouth 2 (two) times daily.     . Cholecalciferol (VITAMIN D3 PO) Take 1 tablet by mouth daily.    . hydroxypropyl methylcellulose (ISOPTO TEARS) 2.5 % ophthalmic solution Place 1 drop into both eyes as needed for dry eyes.    Marland Kitchen LECITHIN PO Take 1 capsule by mouth daily.    . LUTEIN PO Take 1 capsule by mouth daily.    Marland Kitchen MAGNESIUM PO Take 2 tablets by mouth 2 (two) times daily.     . Multiple Vitamins-Minerals (ANTIOXIDANT PO) Take 1 capsule by mouth 2 (two) times daily. BioAstin    . Omega-3 Fatty Acids (FISH OIL PO) Take 1 capsule by mouth 2 (two) times daily.     Marland Kitchen OVER THE COUNTER MEDICATION Take 1 capsule by mouth 2 (two) times daily. Vegetal Silica    . OVER THE COUNTER MEDICATION Take 1 capsule by mouth 2 (two) times daily. Perfect Eyes supplement    . Tafluprost 0.0015 % SOLN Place 1 drop into both eyes daily.     No current facility-administered  medications for this visit.    Allergies  Allergen Reactions  . Other Nausea And Vomiting    Mushrooms   . Shellfish Allergy Nausea And Vomiting    Review of Systems negative except from HPI and PMH  Physical Exam BP 110/62 mmHg  Pulse 53  Ht 5' 4.5" (1.638 m)  Wt 120 lb (54.432 kg)  BMI 20.29 kg/m2 Well developed and well nourished in no acute distress HENT normal E scleral and icterus clear  No clubbing cyanosis  Edema Alert and oriented, grossly normal motor and sensory function Skin Warm and Dry    Assessment and  Plan Atrial ectopy.  Atrial tachycardia  Loop recorder   Sinus bradycardia   She was noted to have atrial tachycardia with rates over 200 bpm. This was associated with presyncope. We had tried amlodipine as a calcium blocker  with no significant bradycardic tendencies; her blood pressure is well-controlled. We will stop the amlodipine and try her on low-dose flecainide 50 mg twice daily. Her episodes are relatively infrequent. We will see her in about 4 weeks to see how she is tolerating the medication.

## 2015-03-18 NOTE — Patient Instructions (Addendum)
Medication Instructions:   START FLECANIDE 50MG  TWICE  A DAY   STOP AMLODIPINE   Labwork:   Testing/Procedures:   Follow-Up:  Your physician recommends that you schedule a follow-up appointment in:  IN 4 WEEKS WITH AMBER   Any Other Special Instructions Will Be Listed Below (If Applicable).

## 2015-03-18 NOTE — Progress Notes (Signed)
Loop recorder 

## 2015-03-19 ENCOUNTER — Telehealth: Payer: Self-pay | Admitting: Internal Medicine

## 2015-03-19 ENCOUNTER — Telehealth: Payer: Self-pay | Admitting: *Deleted

## 2015-03-19 NOTE — Telephone Encounter (Signed)
Follow up     Pt was seen yesterday by Dr Caryl Comes.  She want to get her diagnosis again and find out what does that mean; and, pt is concerned about taking flecainide.  She has questions.  Please call

## 2015-03-19 NOTE — Telephone Encounter (Signed)
Reviewed dx of atrial tachycardia with patient.  Discussed importance of Flecainide. Patient verbalized understanding and agreeable to plan.

## 2015-03-19 NOTE — Telephone Encounter (Signed)
Pt saw Dr Zorita Pang took her off her of her blood pressure medicine. He put her on Flecainide,pt is very concerned about the side effects.

## 2015-03-25 ENCOUNTER — Ambulatory Visit (HOSPITAL_COMMUNITY)
Admission: RE | Admit: 2015-03-25 | Discharge: 2015-03-25 | Disposition: A | Discharge status: Home / Self Care | Payer: Medicare Other | Source: Ambulatory Visit | Attending: Gastroenterology | Admitting: Gastroenterology

## 2015-03-25 ENCOUNTER — Encounter (HOSPITAL_COMMUNITY)
Admission: RE | Admit: 2015-03-25 | Discharge: 2015-03-25 | Disposition: A | Discharge status: Home / Self Care | Payer: Medicare Other | Source: Ambulatory Visit | Attending: Gastroenterology | Admitting: Gastroenterology

## 2015-03-25 DIAGNOSIS — R1011 Right upper quadrant pain: Secondary | ICD-10-CM

## 2015-03-25 DIAGNOSIS — K824 Cholesterolosis of gallbladder: Secondary | ICD-10-CM | POA: Diagnosis not present

## 2015-03-25 DIAGNOSIS — K7689 Other specified diseases of liver: Secondary | ICD-10-CM | POA: Diagnosis not present

## 2015-03-25 DIAGNOSIS — R160 Hepatomegaly, not elsewhere classified: Secondary | ICD-10-CM | POA: Diagnosis not present

## 2015-03-25 DIAGNOSIS — R110 Nausea: Secondary | ICD-10-CM | POA: Diagnosis not present

## 2015-03-25 MED ORDER — TECHNETIUM TC 99M MEBROFENIN IV KIT
5.0000 | PACK | Freq: Once | INTRAVENOUS | Status: AC | PRN
  Administered 2015-03-25: 5 via INTRAVENOUS

## 2015-03-25 MED ORDER — SINCALIDE 5 MCG IJ SOLR
INTRAMUSCULAR | Status: AC
  Administered 2015-03-25: 1.1 ug via INTRAVENOUS
  Filled 2015-03-25: qty 5

## 2015-03-25 MED ORDER — SINCALIDE 5 MCG IJ SOLR
0.0200 ug/kg | Freq: Once | INTRAMUSCULAR | Status: AC
  Administered 2015-03-25: 1.1 ug via INTRAVENOUS

## 2015-03-25 MED ORDER — REGADENOSON 0.4 MG/5ML IV SOLN: 0.4000 mg | Freq: Once | INTRAVENOUS | Status: DC

## 2015-03-26 ENCOUNTER — Telehealth: Payer: Self-pay | Admitting: Internal Medicine

## 2015-03-26 ENCOUNTER — Other Ambulatory Visit: Payer: Self-pay | Admitting: Gastroenterology

## 2015-03-26 DIAGNOSIS — R935 Abnormal findings on diagnostic imaging of other abdominal regions, including retroperitoneum: Secondary | ICD-10-CM

## 2015-03-26 NOTE — Telephone Encounter (Signed)
Routing to device clinic 

## 2015-03-26 NOTE — Telephone Encounter (Signed)
New Message       Pt calling stating that she has a loop recorder and one of her doctors wants her to have an MRI, pt states she called the company for the loop recorder to find out if it was ok to have the MRI done and they said it would be fine, but pt would also like to find out from Dr. Caryl Comes if he thinks it will be ok. Please call back and advise. Pt states it is ok to leave a detailed message on her vm if she is not home.

## 2015-03-27 NOTE — Telephone Encounter (Signed)
Spoke w/pt to let know would be fine to have MRI with LINQ.

## 2015-04-01 ENCOUNTER — Telehealth: Payer: Self-pay | Admitting: Internal Medicine

## 2015-04-01 LAB — CUP PACEART REMOTE DEVICE CHECK: MDC IDC SESS DTM: 20160429040500

## 2015-04-01 NOTE — Telephone Encounter (Signed)
Pt c/o medication issue: 1. Name of Medication: Flecanide 2. How are you currently taking this medication (dosage and times per day)? 50mg  Twice a day  3. Are you having a reaction (difficulty breathing--STAT)?  BP fluctuates 50% of the time it is normal and the highest it has been is 151/82 4. What is your medication issue? Pt called to report that on Monday 05/16, Tuesday 05/17 and Today 5/18 she has had extreme spells of feeling dizzy. Requests a call back to discuss.

## 2015-04-01 NOTE — Addendum Note (Signed)
Addended by: Stanton Kidney on: 04/01/2015 04:41 PM   Modules accepted: Orders, Medications

## 2015-04-01 NOTE — Telephone Encounter (Signed)
Reviewed with Chanetta Marshall, NP -- advised to stop Flecainide. I will obtain LINQ monitor report in the morning and review with Dr. Caryl Comes on Friday when he is back in the office. Patient verbalized understanding and agreeable to plan.

## 2015-04-03 NOTE — Telephone Encounter (Signed)
Informed patient that Dr. Caryl Comes reviewed monitor reports and found some atrial tachcardia, but nothing concerning. Patient will continue to monitor heart off the Flecainide and call if symptoms arise.

## 2015-04-06 ENCOUNTER — Telehealth: Payer: Self-pay | Admitting: Internal Medicine

## 2015-04-06 MED ORDER — AMLODIPINE BESYLATE 5 MG PO TABS: 5.0000 mg | ORAL_TABLET | Freq: Every day | ORAL | Status: DC

## 2015-04-06 NOTE — Telephone Encounter (Signed)
Pt states Dr. Caryl Comes stop Amlodipine 5 mg and started on Flecainide 50 mg twice a day. Pt stop taking Flecainide last Thursday due to side effects. Now pt's BP is running 165/86 to 157/82. This morning her BP was 141/78 which has been the lowest that has been today. Pt is scheduled for a Colonoscopy this coming Wednesday, she think that if her BP is this high probable it will be cancelled. Dr Irish Lack DOD aware he recommends for pt to re-start Amlodipine 5 mg once a day. Pt is aware verbalized understanding.

## 2015-04-06 NOTE — Telephone Encounter (Signed)
New message      Pt c/o BP issue: STAT if pt c/o blurred vision, one-sided weakness or slurred speech  1. What are your last 5 BP readings? 165/82, 141/80, 151/82  2. Are you having any other symptoms (ex. Dizziness, headache, blurred vision, passed out)? Dizziness and headache.  Her shoulders hurt also 3. What is your BP issue? Pt is having a colonscopy on wed at 10am.  Dr Caryl Comes took her off flecainide and amlodipine about 1-2wks ago.  Should she restart her bp meds prior to her colonscopy

## 2015-04-08 ENCOUNTER — Encounter: Payer: Self-pay | Admitting: Internal Medicine

## 2015-04-08 DIAGNOSIS — Z1211 Encounter for screening for malignant neoplasm of colon: Secondary | ICD-10-CM | POA: Diagnosis not present

## 2015-04-08 DIAGNOSIS — K5730 Diverticulosis of large intestine without perforation or abscess without bleeding: Secondary | ICD-10-CM | POA: Diagnosis not present

## 2015-04-09 ENCOUNTER — Encounter: Payer: Self-pay | Admitting: Internal Medicine

## 2015-04-14 ENCOUNTER — Ambulatory Visit (HOSPITAL_COMMUNITY)
Admission: RE | Admit: 2015-04-14 | Discharge: 2015-04-14 | Disposition: A | Discharge status: Home / Self Care | Payer: Medicare Other | Source: Ambulatory Visit | Attending: Gastroenterology | Admitting: Gastroenterology

## 2015-04-14 ENCOUNTER — Encounter: Payer: Self-pay | Admitting: Internal Medicine

## 2015-04-14 ENCOUNTER — Other Ambulatory Visit (HOSPITAL_COMMUNITY): Payer: Self-pay | Admitting: Gastroenterology

## 2015-04-14 ENCOUNTER — Ambulatory Visit (INDEPENDENT_AMBULATORY_CARE_PROVIDER_SITE_OTHER): Payer: Medicare Other | Admitting: *Deleted

## 2015-04-14 DIAGNOSIS — N281 Cyst of kidney, acquired: Secondary | ICD-10-CM | POA: Insufficient documentation

## 2015-04-14 DIAGNOSIS — R002 Palpitations: Secondary | ICD-10-CM

## 2015-04-14 DIAGNOSIS — K869 Disease of pancreas, unspecified: Secondary | ICD-10-CM | POA: Diagnosis not present

## 2015-04-14 DIAGNOSIS — K769 Liver disease, unspecified: Secondary | ICD-10-CM | POA: Diagnosis present

## 2015-04-14 DIAGNOSIS — D1803 Hemangioma of intra-abdominal structures: Secondary | ICD-10-CM | POA: Diagnosis not present

## 2015-04-14 DIAGNOSIS — R935 Abnormal findings on diagnostic imaging of other abdominal regions, including retroperitoneum: Secondary | ICD-10-CM

## 2015-04-14 LAB — CREATININE, SERUM
Creatinine, Ser: 0.54 mg/dL (ref 0.44–1.00)
GFR calc non Af Amer: >60 mL/min (ref >60)

## 2015-04-14 MED ORDER — GADOBENATE DIMEGLUMINE 529 MG/ML IV SOLN: 10.0000 mL | Freq: Once | INTRAVENOUS | Status: AC | PRN

## 2015-04-15 ENCOUNTER — Ambulatory Visit (INDEPENDENT_AMBULATORY_CARE_PROVIDER_SITE_OTHER): Payer: Medicare Other | Admitting: Nurse Practitioner

## 2015-04-15 ENCOUNTER — Encounter: Payer: Self-pay | Admitting: Nurse Practitioner

## 2015-04-15 VITALS — BP 112/74 | HR 55 | Ht 64.5 in | Wt 120.8 lb

## 2015-04-15 DIAGNOSIS — I471 Supraventricular tachycardia: Secondary | ICD-10-CM

## 2015-04-15 DIAGNOSIS — I10 Essential (primary) hypertension: Secondary | ICD-10-CM

## 2015-04-15 NOTE — Progress Notes (Signed)
Electrophysiology Office Note Date: 04/15/2015  ID:  Kelsey James, DOB 06/10/1941, MRN [id number]  PCP: Florina Ou, MD Primary Cardiologist: Debara Pickett Electrophysiologist: Caryl Comes  CC: follow up on atrial tachycardia   Kelsey James is a 74 y.o. female is seen today for Dr Caryl Comes.  She presents today for routine electrophysiology followup.  She has a diagnosis of atrial tachycardia and underwent loop recorder implantation to better qualify the palpitations she has been having.  Her symptoms are relatively infrequent, the last being mid-April at which time her device documented atrial tach at rates around 200bpm that was relatively asymptomatic. She was seen by Dr Caryl Comes and started on Flecainide but was unable to tolerate this due to dizziness.  Other medical therapy has been limited by bradycardia. Since last being seen in our clinic, the patient reports doing very well.  She denies chest pain, palpitations, dyspnea, PND, orthopnea, nausea, vomiting, dizziness, syncope.  Past Medical History  Diagnosis Date  . Glaucoma   . Rosacea   . Dry eyes   . Arthritis   . Atrial ectopy   . Wears glasses   . Osteopenia    Past Surgical History  Procedure Laterality Date  . Tonsillectomy and adenoidectomy  age 8  . Tubal ligation    . Colonoscopy    . Mass excision Right 09/25/2014    Procedure: RIGHT RING FINGER EXCISION MASS AND DEBRIDEMENT DIP JOINT;  Surgeon: Leanora Cover, MD;  Location: [place];  Service: Orthopedics;  Laterality: Right;  . Loop recorder implant  09/14/2014    MDT LINQ implanted by Dr Caryl Comes for palpitations  . Left heart catheterization with coronary angiogram N/A 03/12/2014    Procedure: LEFT HEART CATHETERIZATION WITH CORONARY ANGIOGRAM;  Surgeon: Pixie Casino, MD;  [facility] CATH LAB;  Service: Cardiovascular;  Laterality: N/A;  . Loop recorder implant N/A 09/15/2014    Procedure: LOOP RECORDER IMPLANT;  Surgeon: Deboraha Sprang, MD;  Location:  [facility] CATH LAB;  Service: Cardiovascular;  Laterality: N/A;    Current Outpatient Prescriptions  Medication Sig Dispense Refill  . acetaminophen (TYLENOL) 500 MG tablet Take 500-1,000 mg by mouth every 6 (six) hours as needed (pain).    Marland Kitchen ALPRAZolam (XANAX) 0.25 MG tablet Take 1 tablet (0.25 mg total) by mouth at bedtime as needed for anxiety. 30 tablet 1  . amLODipine (NORVASC) 5 MG tablet Take 1 tablet (5 mg total) by mouth daily.    Marland Kitchen b complex vitamins capsule Take 1 capsule by mouth daily.    . Biotin 1000 MCG tablet Take 1,000 mcg by mouth daily.    . Calcium-Magnesium (CALCIUM MAGNESIUM 750) 300-300 MG TABS Take 2 tablets by mouth 2 (two) times daily.     . Cholecalciferol (VITAMIN D3 PO) Take 1 tablet by mouth daily.    . hydroxypropyl methylcellulose (ISOPTO TEARS) 2.5 % ophthalmic solution Place 1 drop into both eyes as needed for dry eyes.    Marland Kitchen LECITHIN PO Take 1 capsule by mouth daily.    . LUTEIN PO Take 1 capsule by mouth daily.    Marland Kitchen MAGNESIUM PO Take 2 tablets by mouth 2 (two) times daily.     . Omega-3 Fatty Acids (FISH OIL PO) Take 1 capsule by mouth 2 (two) times daily.     Marland Kitchen OVER THE COUNTER MEDICATION Take 1 capsule by mouth 2 (two) times daily. Vegetal Silica    . OVER THE COUNTER MEDICATION Take 1 capsule by mouth  2 (two) times daily. Perfect Eyes supplement    . Tafluprost 0.0015 % SOLN Place 1 drop into both eyes daily.     No current facility-administered medications for this visit.    Allergies:   Other and Shellfish allergy   Social History: History   Social History  . Marital Status: Married    Spouse Name: N/A  . Number of Children: N/A  . Years of Education: N/A   Occupational History  . Not on file.   Social History Main Topics  . Smoking status: Former Smoker -- 0.50 packs/day for 25 years    Types: Cigarettes    Quit date: 11/22/2002  . Smokeless tobacco: Never Used  . Alcohol Use: 4.2 oz/week    7 Glasses of wine per week  . Drug Use: No  .  Sexual Activity: Not on file   Other Topics Concern  . Not on file   Social History Narrative    Family History: Family History  Problem Relation Age of Onset  . Valvular heart disease Mother   . Cancer Maternal Grandmother   . Depression Father     Review of Systems: All other systems reviewed and are otherwise negative except as noted above.   Physical Exam: VS:  BP 112/74 mmHg  Pulse 55  Ht 5' 4.5" (1.638 m)  Wt 120 lb 12.8 oz (54.795 kg)  BMI 20.42 kg/m2 , BMI Body mass index is 20.42 kg/(m^2). Wt Readings from Last 3 Encounters:  04/15/15 120 lb 12.8 oz (54.795 kg)  03/18/15 120 lb (54.432 kg)  01/07/15 121 lb (54.885 kg)    GEN- The patient is well and thin appearing, alert and oriented x 3 today.   HEENT: normocephalic, atraumatic; sclera clear, conjunctiva pink; hearing intact; oropharynx clear; neck supple, no JVP Lymph- no cervical lymphadenopathy Lungs- Clear to ausculation bilaterally, normal work of breathing.  No wheezes, rales, rhonchi Heart- Regular rate and rhythm, no murmurs, rubs or gallops GI- soft, non-tender, non-distended, bowel sounds present Extremities- no clubbing, cyanosis, or edema; DP/PT/radial pulses 2+ bilaterally MS- no significant deformity or atrophy Skin- warm and dry, no rash or lesion  Psych- euthymic mood, full affect Neuro- strength and sensation are intact   EKG:  EKG is ordered today. The ekg ordered today shows sinus bradycardia, rate 55, PR 98, no pre-excitation  Recent Labs: 09/05/2014: Hemoglobin 12.9; Platelets 269.0 10/29/2014: BUN 8; Potassium 3.6; Sodium 142 04/14/2015: Creatinine 0.54    Other studies Reviewed: Additional studies/ records that were reviewed today include: Dr Olin Pia office note, previous EKGs  Assessment and Plan: 1.  Atrial tachycardia The patient has documented atrial tachycardia on loop recorder.  She was intolerant of Flecainide.  Other medical therapy is limited by bradycardia.  With  infrequent episodes, she would like to continue watchful waiting for now. We briefly discussed ablation, but she is aware that this procedure is reliant on the ability to induce and sustain arrhythmias. She would like to manage conservatively.   2.  HTN Stable on amlodipine No changes today   Current medicines are reviewed at length with the patient today.   The patient does not have concerns regarding her medicines.  The following changes were made today:  none  Labs/ tests ordered today include:  Orders Placed This Encounter  Procedures  . EKG 12-Lead     Disposition:   Follow up with me in 3 months   Signed, Chanetta Marshall, NP 04/15/2015 4:31 PM   [place]  [address] [place] [address] [phone] (office) [phone] (fax)

## 2015-04-15 NOTE — Patient Instructions (Signed)
Medication Instructions:  Your physician recommends that you continue on your current medications as directed. Please refer to the Current Medication list given to you today.   Labwork: None   Testing/Procedures: None   Follow-Up: Your physician recommends that you schedule a follow-up appointment in: 3 months with Amber Seiler,NP   Any Other Special Instructions Will Be Listed Below (If Applicable).

## 2015-04-17 NOTE — Progress Notes (Signed)
Loop recorder 

## 2015-04-21 LAB — CUP PACEART REMOTE DEVICE CHECK
MDC IDC SESS DTM: 20160531191251
MDC IDC SET ZONE DETECTION INTERVAL: 2000 ms
MDC IDC SET ZONE DETECTION INTERVAL: 3000 ms
Zone Setting Detection Interval: 380 ms

## 2015-04-22 ENCOUNTER — Encounter: Payer: Self-pay | Admitting: Internal Medicine

## 2015-04-29 ENCOUNTER — Encounter: Payer: Self-pay | Admitting: Cardiology

## 2015-05-05 ENCOUNTER — Encounter: Payer: Self-pay | Admitting: Internal Medicine

## 2015-05-07 DIAGNOSIS — H4011X2 Primary open-angle glaucoma, moderate stage: Secondary | ICD-10-CM | POA: Diagnosis not present

## 2015-05-11 ENCOUNTER — Other Ambulatory Visit: Payer: Self-pay | Admitting: Internal Medicine

## 2015-05-11 ENCOUNTER — Encounter: Payer: Self-pay | Admitting: Internal Medicine

## 2015-05-12 ENCOUNTER — Telehealth: Payer: Self-pay | Admitting: Cardiology

## 2015-05-12 NOTE — Telephone Encounter (Signed)
Pt is requesting that Erasmo Downer call her back.

## 2015-05-13 ENCOUNTER — Encounter: Payer: Self-pay | Admitting: Internal Medicine

## 2015-05-13 ENCOUNTER — Other Ambulatory Visit: Payer: Self-pay | Admitting: Internal Medicine

## 2015-05-13 ENCOUNTER — Ambulatory Visit (INDEPENDENT_AMBULATORY_CARE_PROVIDER_SITE_OTHER): Payer: Medicare Other | Admitting: *Deleted

## 2015-05-13 DIAGNOSIS — R002 Palpitations: Secondary | ICD-10-CM

## 2015-05-13 LAB — CUP PACEART REMOTE DEVICE CHECK: Date Time Interrogation Session: 20160707161006

## 2015-05-14 ENCOUNTER — Telehealth: Payer: Self-pay | Admitting: Internal Medicine

## 2015-05-14 ENCOUNTER — Encounter: Payer: Self-pay | Admitting: Internal Medicine

## 2015-05-14 ENCOUNTER — Ambulatory Visit (INDEPENDENT_AMBULATORY_CARE_PROVIDER_SITE_OTHER): Payer: Medicare Other | Admitting: Internal Medicine

## 2015-05-14 VITALS — BP 122/68 | HR 54 | Ht 64.0 in | Wt 121.9 lb

## 2015-05-14 DIAGNOSIS — I471 Supraventricular tachycardia: Secondary | ICD-10-CM | POA: Diagnosis not present

## 2015-05-14 DIAGNOSIS — I10 Essential (primary) hypertension: Secondary | ICD-10-CM | POA: Diagnosis not present

## 2015-05-14 DIAGNOSIS — R002 Palpitations: Secondary | ICD-10-CM

## 2015-05-14 MED ORDER — POTASSIUM CHLORIDE CRYS ER 20 MEQ PO TBCR: 20.0000 meq | EXTENDED_RELEASE_TABLET | Freq: Two times a day (BID) | ORAL | Status: DC

## 2015-05-14 NOTE — Progress Notes (Signed)
OFFICE NOTE  Chief Complaint:  Palpitations  Primary Care Physician: Florina Ou, MD  HPI:  Kelsey James is a pleasant 74 year old female with little past medical history. For years she's had very low blood pressure however recently her blood pressure has spiked up into the upper 180's to over 449 systolic. She recently presented to the emergency room and any pain with symptoms such as throbbing in her neck and head with uncontrolled blood pressure. Subsequently she was started on low-dose amlodipine and she's had a nice improvement in her blood pressure. She does however report lower extremity swelling in her feet. She also describes what I believe is chest tightness. She actually says that she feels that her chest is "caving in" or "deflating". There is some associated sporadic shortness of breath but it has improved with her blood pressure medicine. She does do some activity and exercise and reports that her symptoms improve with exercise.  Kelsey James had a recent nuclear stress test which demonstrated reversible distal anteroapical and anterolateral reversible ischemia. EF of 69%.  I interpreted as intermediate risk due to the distal location of the ischemia however is abnormal. She is relatively thin and I did not suspect this is due to attenuation artifact.  Ultimately she was referred for cardiac catheterization.  This was performed by myself and demonstrated normal coronary arteries. Subsequently she been having palpitations and I arranged for a monitor. She wore that monitor her for 2 weeks between 03/28/2014 and 04/11/2014. Total monitoring time was 288 hours and 50 minutes. Lowest heart rate was 41 and average heart rate was 59. She was noted to have PACs, short runs of PSVT up to 14 beats were also noted.  This likely explains her palpitations.  Given her bradycardia, I recommended discontinuing her timolol, which is the only medication that I can see which could  have been causing her bradycardia. She now presents today as a walk-in with feelings of her heart thumping. She was playing progressive bridge today and she started having heaviness in her chest. She felt like her heart was racing and she was short of breath, dizzy and like she was going to pass out. An EKG was performed in the office today which showed normal sinus rhythm at 71. Blood pressure was normal.  Kelsey James returns today for follow-up. In the interim she is seeing Dr. Jolyn Nap for evaluation of her palpitations and arrhythmia. She was felt to have 2 separate items. The first being palpitations and an ectopic atrial tachycardia with PACs which he felt were most likely benign given her normal coronary anatomy. The second was concerning more for atrial fibrillation, which relates to the episodes where she feels like her chest is "caving in". I discussed multiple ways to try to evaluate for arrhythmias, but given the infrequency of the events, ultimately he recommended an implantable loop recorder. She did undergo successful loop recorder placement and has had 2 transmissions that I reviewed, neither of which have demonstrated any atrial fibrillation. She continues to have some palpitations. She does related somewhat to caffeine and particular chocolate. She reports eating moose tracks ice cream every evening. I did ask that she may want to decrease some of her chocolate intake. Recently she's had problems with hypokalemia. We will plan to recheck a BMET today.  Kelsey James was seen by Dr. Caryl Comes after there was identification of a paroxysmal atrial tachycardia  on her implanted loop recorder. He recommended she start on flecainide 50 mg twice daily. Since starting this medicine she reports feeling horrible. Subsequently she followed up with Kelsey Marshall, NP, who recommended that she discontinue her flecainide. There is been consideration about alternative antiarrhythmics. At this time she wishes to  stay off of anti-rhythmic medication.  PMHx:  Past Medical History  Diagnosis Date  . Glaucoma   . Rosacea   . Dry eyes   . Arthritis   . Atrial ectopy   . Wears glasses   . Osteopenia     Past Surgical History  Procedure Laterality Date  . Tonsillectomy and adenoidectomy  age 56  . Tubal ligation    . Colonoscopy    . Mass excision Right 09/25/2014    Procedure: RIGHT RING FINGER EXCISION MASS AND DEBRIDEMENT DIP JOINT;  Surgeon: Leanora Cover, MD;  Location: [place];  Service: Orthopedics;  Laterality: Right;  . Loop recorder implant  09/14/2014    MDT LINQ implanted by Dr Caryl Comes for palpitations  . Left heart catheterization with coronary angiogram N/A 03/12/2014    Procedure: LEFT HEART CATHETERIZATION WITH CORONARY ANGIOGRAM;  Surgeon: Pixie Casino, MD;  Location: [facility] CATH LAB;  Service: Cardiovascular;  Laterality: N/A;  . Loop recorder implant N/A 09/15/2014    Procedure: LOOP RECORDER IMPLANT;  Surgeon: Deboraha Sprang, MD;  Location: [facility] CATH LAB;  Service: Cardiovascular;  Laterality: N/A;    FAMHx:  Family History  Problem Relation Age of Onset  . Valvular heart disease Mother   . Cancer Maternal Grandmother   . Depression Father     SOCHx:   reports that she quit smoking about 12 years ago. Her smoking use included Cigarettes. She has a 12.5 pack-year smoking history. She has never used smokeless tobacco. She reports that she drinks about 4.2 oz of alcohol per week. She reports that she does not use illicit drugs.  ALLERGIES:  Allergies  Allergen Reactions  . Other Nausea And Vomiting    Mushrooms   . Shellfish Allergy Nausea And Vomiting    ROS: A comprehensive review of systems was negative except for: Cardiovascular: positive for palpitations  HOME MEDS: Current Outpatient Prescriptions  Medication Sig Dispense Refill  . acetaminophen (TYLENOL) 500 MG tablet Take 500-1,000 mg by mouth every 6 (six) hours as needed (pain).    Marland Kitchen  ALPRAZolam (XANAX) 0.25 MG tablet Take 1 tablet (0.25 mg total) by mouth at bedtime as needed for anxiety. 30 tablet 1  . b complex vitamins capsule Take 1 capsule by mouth daily.    . Biotin 1000 MCG tablet Take 1,000 mcg by mouth daily.    . Calcium-Magnesium (CALCIUM MAGNESIUM 750) 300-300 MG TABS Take 2 tablets by mouth 2 (two) times daily.     . hydroxypropyl methylcellulose (ISOPTO TEARS) 2.5 % ophthalmic solution Place 1 drop into both eyes as needed for dry eyes.    . Hypochlorous Acid (AVENOVA) 0.01 % SOLN Apply 1 mL topically daily.    Marland Kitchen LECITHIN PO Take 1 capsule by mouth daily.    . LUTEIN PO Take 1 capsule by mouth daily.    Marland Kitchen MAGNESIUM PO Take 2 tablets by mouth 2 (two) times daily.     . Omega-3 Fatty Acids (FISH OIL PO) Take 1 capsule by mouth 2 (two) times daily.     Marland Kitchen OVER THE COUNTER MEDICATION Take 1 capsule by mouth 2 (two) times daily. Vegetal Silica    .  OVER THE COUNTER MEDICATION Take 1 capsule by mouth 2 (two) times daily. Perfect Eyes supplement    . Tafluprost 0.0015 % SOLN Place 1 drop into both eyes daily.    . potassium chloride SA (K-DUR,KLOR-CON) 20 MEQ tablet Take 1 tablet (20 mEq total) by mouth 2 (two) times daily. 30 tablet 6   No current facility-administered medications for this visit.    LABS/IMAGING: No results found for this or any previous visit (from the past 48 hour(s)). No results found.  VITALS: BP 122/68 mmHg  Pulse 54  Ht 5\' 4"  (1.626 m)  Wt 121 lb 14.4 oz (55.293 kg)  BMI 20.91 kg/m2  EXAM: deferred  EKG: Sinus bradycardia 54  ASSESSMENT: 1. Hypertension - BP now normalized on amlodipine 2. Chest pressure - normal coronary arteries by cardiac cath 3. Bradycardia - improved off timolol, s/p loop recorder implant 4. Dyspnea and exertion 5. Palpitations 6. Dizziness -presyncope 7. Hypokalemia  PLAN: 1.   Mrs. Germany has been having paroxysmal atrial tachycardia. She was intolerant to flecainide. She may need to consider  trying a different anti-rhythmic. For now I recommend keeping her off of antiarrhythmics that she if she has any more recurrences that she is symptomatic with. Given her history of hypokalemia, I recommend she start on potassium 20 mEq daily. She is scheduled follow-up in the EP clinic. She is going to consider possible electrophysiology study and catheter ablation as an option.  Pixie Casino, MD, [facility] Attending Cardiologist [place] [facility] Petrice Beedy 05/14/2015, 9:54 PM

## 2015-05-14 NOTE — Telephone Encounter (Signed)
New problem   Pt has a loop recorder and it's not transmitting. Please call pt.

## 2015-05-14 NOTE — Progress Notes (Signed)
Loop recorder 

## 2015-05-14 NOTE — Patient Instructions (Addendum)
Your physician wants you to follow-up in: 6 months with Dr. Debara Pickett. You will receive a reminder letter in the mail two months in advance. If you don't receive a letter, please call our office to schedule the follow-up appointment.   START potassium 23meq once daily

## 2015-05-14 NOTE — Telephone Encounter (Signed)
Spoke w/ pt and informed her that her transmission are being received. She verbalized understanding.

## 2015-05-20 DIAGNOSIS — B399 Histoplasmosis, unspecified: Secondary | ICD-10-CM | POA: Diagnosis not present

## 2015-05-20 DIAGNOSIS — H32 Chorioretinal disorders in diseases classified elsewhere: Secondary | ICD-10-CM | POA: Diagnosis not present

## 2015-05-20 DIAGNOSIS — H2513 Age-related nuclear cataract, bilateral: Secondary | ICD-10-CM | POA: Diagnosis not present

## 2015-05-20 DIAGNOSIS — H01003 Unspecified blepharitis right eye, unspecified eyelid: Secondary | ICD-10-CM | POA: Diagnosis not present

## 2015-05-20 DIAGNOSIS — H01006 Unspecified blepharitis left eye, unspecified eyelid: Secondary | ICD-10-CM | POA: Diagnosis not present

## 2015-05-20 DIAGNOSIS — H4053X3 Glaucoma secondary to other eye disorders, bilateral, severe stage: Secondary | ICD-10-CM | POA: Diagnosis not present

## 2015-05-21 ENCOUNTER — Telehealth: Payer: Self-pay | Admitting: *Deleted

## 2015-05-21 NOTE — Telephone Encounter (Signed)
Pt still concerned about event occurring 6-21 while playing bridge. Pt states her bridge is sanctioned & very competitive. States she gets very stressed during bridge. Pt aware she may need to cease playing bridge but is reluctant to do so. Pt will stop engaging in such stress if instructed to do so. Otherwise, she will continue to play.

## 2015-05-21 NOTE — Telephone Encounter (Signed)
Pt does not recall symptom on 05-14-15, she had to look at calendar. She believes something might have happened prior to awakening.

## 2015-05-28 ENCOUNTER — Encounter: Payer: Self-pay | Admitting: Internal Medicine

## 2015-06-05 ENCOUNTER — Encounter: Payer: Self-pay | Admitting: Internal Medicine

## 2015-06-08 ENCOUNTER — Encounter: Payer: Self-pay | Admitting: Internal Medicine

## 2015-06-12 ENCOUNTER — Ambulatory Visit (INDEPENDENT_AMBULATORY_CARE_PROVIDER_SITE_OTHER): Payer: Medicare Other | Admitting: *Deleted

## 2015-06-12 ENCOUNTER — Encounter: Payer: Self-pay | Admitting: Internal Medicine

## 2015-06-12 DIAGNOSIS — R002 Palpitations: Secondary | ICD-10-CM

## 2015-06-17 NOTE — Progress Notes (Signed)
Loop recorder 

## 2015-06-19 ENCOUNTER — Encounter: Payer: Self-pay | Admitting: Cardiology

## 2015-06-29 ENCOUNTER — Encounter: Payer: Self-pay | Admitting: Internal Medicine

## 2015-07-02 DIAGNOSIS — H04123 Dry eye syndrome of bilateral lacrimal glands: Secondary | ICD-10-CM | POA: Diagnosis not present

## 2015-07-02 DIAGNOSIS — H4053X3 Glaucoma secondary to other eye disorders, bilateral, severe stage: Secondary | ICD-10-CM | POA: Diagnosis not present

## 2015-07-02 DIAGNOSIS — H40043 Steroid responder, bilateral: Secondary | ICD-10-CM | POA: Diagnosis not present

## 2015-07-02 DIAGNOSIS — B399 Histoplasmosis, unspecified: Secondary | ICD-10-CM | POA: Diagnosis not present

## 2015-07-02 DIAGNOSIS — H35353 Cystoid macular degeneration, bilateral: Secondary | ICD-10-CM | POA: Diagnosis not present

## 2015-07-02 DIAGNOSIS — H32 Chorioretinal disorders in diseases classified elsewhere: Secondary | ICD-10-CM | POA: Diagnosis not present

## 2015-07-02 DIAGNOSIS — H31013 Macula scars of posterior pole (postinflammatory) (post-traumatic), bilateral: Secondary | ICD-10-CM | POA: Diagnosis not present

## 2015-07-13 ENCOUNTER — Ambulatory Visit (INDEPENDENT_AMBULATORY_CARE_PROVIDER_SITE_OTHER): Payer: Medicare Other | Admitting: *Deleted

## 2015-07-13 DIAGNOSIS — R002 Palpitations: Secondary | ICD-10-CM | POA: Diagnosis not present

## 2015-07-15 ENCOUNTER — Telehealth: Payer: Self-pay | Admitting: *Deleted

## 2015-07-15 NOTE — Telephone Encounter (Signed)
[facility] requesting call back.  Need to know if patient was symptomatic with tachy episode on 07/10/15 at 14:44.

## 2015-07-15 NOTE — Progress Notes (Signed)
Loop recorder 

## 2015-07-22 ENCOUNTER — Ambulatory Visit (INDEPENDENT_AMBULATORY_CARE_PROVIDER_SITE_OTHER): Payer: Medicare Other | Admitting: Nurse Practitioner

## 2015-07-22 VITALS — BP 128/72 | HR 66 | Ht 64.0 in | Wt 121.0 lb

## 2015-07-22 DIAGNOSIS — I10 Essential (primary) hypertension: Secondary | ICD-10-CM | POA: Diagnosis not present

## 2015-07-22 DIAGNOSIS — E876 Hypokalemia: Secondary | ICD-10-CM

## 2015-07-22 DIAGNOSIS — I471 Supraventricular tachycardia: Secondary | ICD-10-CM

## 2015-07-22 NOTE — Progress Notes (Signed)
Electrophysiology Office Note Date: 07/22/2015  ID:  [name], DOB [birth year], MRN [id number]  PCP: Florina Ou, MD Primary Cardiologist: Debara Pickett Electrophysiologist: Caryl Comes  CC: follow up on atrial tachycardia   Kelsey James is a 74 y.o. female is seen today for Dr Caryl Comes.  She presents today for routine electrophysiology followup.  She has a diagnosis of atrial tachycardia and underwent loop recorder implantation to better qualify the palpitations she has been having.  Her symptoms are relatively infrequent, the last being mid-April at which time her device documented atrial tach at rates around 200bpm that was relatively asymptomatic. She has had 2 episodes of tachycardia since then that have been very short in duration (<10 seconds) and have been associated with palpitations.  She was intolerant of Flecainide and other medical therapy has been limited by bradycardia. Since last being seen in our clinic, the patient reports doing very well.  She denies chest pain, palpitations, dyspnea, PND, orthopnea, nausea, vomiting, syncope.  Past Medical History  Diagnosis Date  . Glaucoma   . Rosacea   . Dry eyes   . Arthritis   . Atrial ectopy   . Wears glasses   . Osteopenia    Past Surgical History  Procedure Laterality Date  . Tonsillectomy and adenoidectomy  age 17  . Tubal ligation    . Colonoscopy    . Mass excision Right 09/25/2014    Procedure: RIGHT RING FINGER EXCISION MASS AND DEBRIDEMENT DIP JOINT;  Surgeon: Leanora Cover, MD;  Location: [facility];  Service: Orthopedics;  Laterality: Right;  . Loop recorder implant  09/14/2014    MDT LINQ implanted by Dr Caryl Comes for palpitations  . Left heart catheterization with coronary angiogram N/A 03/12/2014    Procedure: LEFT HEART CATHETERIZATION WITH CORONARY ANGIOGRAM;  Surgeon: Pixie Casino, MD;  Location: [facility] CATH LAB;  Service: Cardiovascular;  Laterality: N/A;  . Loop recorder implant N/A 09/15/2014   Procedure: LOOP RECORDER IMPLANT;  Surgeon: Deboraha Sprang, MD;  Location: [facility] CATH LAB;  Service: Cardiovascular;  Laterality: N/A;    Current Outpatient Prescriptions  Medication Sig Dispense Refill  . acetaminophen (TYLENOL) 500 MG tablet Take 500-1,000 mg by mouth every 6 (six) hours as needed (pain).    Marland Kitchen ALPRAZolam (XANAX) 0.25 MG tablet Take 1 tablet (0.25 mg total) by mouth at bedtime as needed for anxiety. 30 tablet 1  . b complex vitamins capsule Take 1 capsule by mouth daily.    . Biotin 1000 MCG tablet Take 1,000 mcg by mouth daily.    . Calcium-Magnesium (CALCIUM MAGNESIUM 750) 300-300 MG TABS Take 2 tablets by mouth 2 (two) times daily.     . hydroxypropyl methylcellulose (ISOPTO TEARS) 2.5 % ophthalmic solution Place 1 drop into both eyes as needed for dry eyes.    . Hypochlorous Acid (AVENOVA) 0.01 % SOLN Apply 1 mL topically daily.    Marland Kitchen LECITHIN PO Take 1 capsule by mouth daily.    . LUTEIN PO Take 1 capsule by mouth daily.    Marland Kitchen MAGNESIUM PO Take 2 tablets by mouth 2 (two) times daily.     . Omega-3 Fatty Acids (FISH OIL PO) Take 1 capsule by mouth 2 (two) times daily.     Marland Kitchen OVER THE COUNTER MEDICATION Take 1 capsule by mouth 2 (two) times daily. Vegetal Silica    . OVER THE COUNTER MEDICATION Take 1 capsule by mouth 2 (two) times daily. Perfect Eyes supplement    .  potassium chloride SA (K-DUR,KLOR-CON) 20 MEQ tablet Take 1 tablet (20 mEq total) by mouth 2 (two) times daily. 30 tablet 6  . ZIOPTAN 0.0015 % SOLN      No current facility-administered medications for this visit.    Allergies:   Other and Shellfish allergy   Social History: Social History   Social History  . Marital Status: Married    Spouse Name: N/A  . Number of Children: N/A  . Years of Education: N/A   Occupational History  . Not on file.   Social History Main Topics  . Smoking status: Former Smoker -- 0.50 packs/day for 25 years    Types: Cigarettes    Quit date: 11/22/2002  . Smokeless  tobacco: Never Used  . Alcohol Use: 4.2 oz/week    7 Glasses of wine per week  . Drug Use: No  . Sexual Activity: Not on file   Other Topics Concern  . Not on file   Social History Narrative    Family History: Family History  Problem Relation Age of Onset  . Valvular heart disease Mother   . Cancer Maternal Grandmother   . Depression Father     Review of Systems: All other systems reviewed and are otherwise negative except as noted above.   Physical Exam: VS:  BP 128/72 mmHg  Pulse 66  Ht 5\' 4"  (1.626 m)  Wt 121 lb (54.885 kg)  BMI 20.76 kg/m2 , BMI Body mass index is 20.76 kg/(m^2). Wt Readings from Last 3 Encounters:  07/22/15 121 lb (54.885 kg)  05/14/15 121 lb 14.4 oz (55.293 kg)  04/15/15 120 lb 12.8 oz (54.795 kg)    GEN- The patient is well and thin appearing, alert and oriented x 3 today.   HEENT: normocephalic, atraumatic; sclera clear, conjunctiva pink; hearing intact; oropharynx clear; neck supple, no JVP Lymph- no cervical lymphadenopathy Lungs- Clear to ausculation bilaterally, normal work of breathing.  No wheezes, rales, rhonchi Heart- Regular rate and rhythm, no murmurs, rubs or gallops GI- soft, non-tender, non-distended, bowel sounds present Extremities- no clubbing, cyanosis, or edema; DP/PT/radial pulses 2+ bilaterally MS- no significant deformity or atrophy Skin- warm and dry, no rash or lesion  Psych- euthymic mood, full affect Neuro- strength and sensation are intact  EKG:  EKG is not ordered today.  Recent Labs: 09/05/2014: Hemoglobin 12.9; Platelets 269.0 10/29/2014: BUN 8; Potassium 3.6; Sodium 142 04/14/2015: Creatinine, Ser 0.54    Assessment and Plan: 1.  Atrial tachycardia The patient has documented atrial tachycardia on loop recorder.  She was intolerant of Flecainide.  Other medical therapy is limited by bradycardia.  With infrequent and short episodes, she would like to continue watchful waiting for now.  We again discussed  ablation, but she is aware that this procedure is reliant on the ability to induce and sustain arrhythmias.  Will arrange for follow up in 3 months with Dr Curt Bears to discuss possible EPS/ablation. She will call with increased arrhythmia burden between now and then.   2.  HTN Stable on amlodipine No changes today   Current medicines are reviewed at length with the patient today.   The patient does not have concerns regarding her medicines.  The following changes were made today:  none  Labs/ tests ordered today include: none  Disposition:   Follow up with Dr Curt Bears in 3 months   Signed, Chanetta Marshall, NP 07/22/2015 1:27 PM   [place] [address] [place] [address] [phone] (office) [phone] (  fax)

## 2015-07-22 NOTE — Patient Instructions (Signed)
Medication Instructions:    Your physician recommends that you continue on your current medications as directed. Please refer to the Current Medication list given to you today.  Labwork:  NONE ORDER TODAY   Testing/Procedures:  NONE ORDER TODAY   Follow-Up:  IN 3 MONTHS  WITH DR CAMNITZ   Any Other Special Instructions Will Be Listed Below (If Applicable).

## 2015-07-24 ENCOUNTER — Other Ambulatory Visit: Payer: Self-pay | Admitting: Gynecology

## 2015-07-24 NOTE — Telephone Encounter (Signed)
Called into pharmacy

## 2015-07-24 NOTE — Telephone Encounter (Signed)
Patient discussed symptom episodes with Chanetta Marshall, NP at appointment on 07/22/2015.

## 2015-08-03 ENCOUNTER — Encounter: Payer: Self-pay | Admitting: Internal Medicine

## 2015-08-12 ENCOUNTER — Ambulatory Visit (INDEPENDENT_AMBULATORY_CARE_PROVIDER_SITE_OTHER): Payer: Medicare Other | Admitting: *Deleted

## 2015-08-12 ENCOUNTER — Other Ambulatory Visit: Payer: Self-pay | Admitting: *Deleted

## 2015-08-12 DIAGNOSIS — R002 Palpitations: Secondary | ICD-10-CM

## 2015-08-12 DIAGNOSIS — H4053X3 Glaucoma secondary to other eye disorders, bilateral, severe stage: Secondary | ICD-10-CM | POA: Diagnosis not present

## 2015-08-12 LAB — CUP PACEART REMOTE DEVICE CHECK: Date Time Interrogation Session: 20160828200806

## 2015-08-12 MED ORDER — POTASSIUM CHLORIDE CRYS ER 20 MEQ PO TBCR: 20.0000 meq | EXTENDED_RELEASE_TABLET | Freq: Two times a day (BID) | ORAL | Status: DC

## 2015-08-12 NOTE — Progress Notes (Signed)
Carelink summary report received. Battery status OK. Normal device function. No new symptom, brady, pause, or AF episodes. 1 tachy episode, duration ~10 sec, peak V 171bpm. Monthly summary reports and ROV with AS on 07/22/15 at 12:40pm.

## 2015-08-13 LAB — CUP PACEART REMOTE DEVICE CHECK: MDC IDC SESS DTM: 20160927200727

## 2015-08-13 NOTE — Progress Notes (Signed)
Carelink summary report received. Battery status OK. Normal device function. No new symptom episodes, tachy episodes, brady, or pause episodes. No new AF episodes. Monthly summary reports and ROV with WC on 10/28/15 at 1:30pm.

## 2015-08-13 NOTE — Progress Notes (Signed)
Loop recorder 

## 2015-08-18 ENCOUNTER — Telehealth: Payer: Self-pay | Admitting: *Deleted

## 2015-08-18 NOTE — Telephone Encounter (Signed)
Opened in error

## 2015-08-19 DIAGNOSIS — H2513 Age-related nuclear cataract, bilateral: Secondary | ICD-10-CM | POA: Diagnosis not present

## 2015-08-19 DIAGNOSIS — H0289 Other specified disorders of eyelid: Secondary | ICD-10-CM | POA: Diagnosis not present

## 2015-08-19 DIAGNOSIS — H04123 Dry eye syndrome of bilateral lacrimal glands: Secondary | ICD-10-CM | POA: Diagnosis not present

## 2015-08-24 ENCOUNTER — Encounter: Payer: Self-pay | Admitting: Internal Medicine

## 2015-08-25 ENCOUNTER — Encounter: Payer: Self-pay | Admitting: Internal Medicine

## 2015-09-02 ENCOUNTER — Telehealth: Payer: Self-pay | Admitting: *Deleted

## 2015-09-02 ENCOUNTER — Encounter: Payer: Self-pay | Admitting: Internal Medicine

## 2015-09-02 NOTE — Telephone Encounter (Signed)
Called patient regarding tachy episode on LINQ transmission from 09/01/15.  Patient reports experiencing palpitations, states "my heart felt like it was going to beat right out of my chest".  Patient states that she has been doing well aside from this episode.  Advised that I would review episode with EP provider and that if he or she has any additional recommendations, that I will call her back.  Patient verbalized understanding and denies any additional questions or concerns at this time.

## 2015-09-03 NOTE — Telephone Encounter (Signed)
LMOM requesting call back

## 2015-09-04 NOTE — Telephone Encounter (Signed)
Patient returned call.  Rescheduled December appointment with Dr. Curt Bears to 09/17/15 at 3:00pm per recommendation from Chanetta Marshall, NP.  Patient agreeable to earlier appointment and denies any questions or concerns at this time.  Episode placed in Dr. Kathalene Frames folder for review.

## 2015-09-10 ENCOUNTER — Ambulatory Visit (INDEPENDENT_AMBULATORY_CARE_PROVIDER_SITE_OTHER): Payer: Medicare Other | Admitting: *Deleted

## 2015-09-10 DIAGNOSIS — R002 Palpitations: Secondary | ICD-10-CM | POA: Diagnosis not present

## 2015-09-11 NOTE — Progress Notes (Signed)
Loop recorder 

## 2015-09-15 ENCOUNTER — Encounter: Payer: Self-pay | Admitting: Internal Medicine

## 2015-09-16 DIAGNOSIS — H0289 Other specified disorders of eyelid: Secondary | ICD-10-CM | POA: Diagnosis not present

## 2015-09-16 DIAGNOSIS — H04123 Dry eye syndrome of bilateral lacrimal glands: Secondary | ICD-10-CM | POA: Diagnosis not present

## 2015-09-17 ENCOUNTER — Encounter: Payer: Self-pay | Admitting: *Deleted

## 2015-09-17 ENCOUNTER — Encounter: Payer: Self-pay | Admitting: Cardiology

## 2015-09-17 ENCOUNTER — Ambulatory Visit (INDEPENDENT_AMBULATORY_CARE_PROVIDER_SITE_OTHER): Payer: Medicare Other | Admitting: Cardiology

## 2015-09-17 VITALS — BP 124/82 | HR 58 | Ht 63.5 in | Wt 121.2 lb

## 2015-09-17 DIAGNOSIS — Z01812 Encounter for preprocedural laboratory examination: Secondary | ICD-10-CM | POA: Diagnosis not present

## 2015-09-17 DIAGNOSIS — I471 Supraventricular tachycardia: Secondary | ICD-10-CM | POA: Diagnosis not present

## 2015-09-17 LAB — CUP PACEART INCLINIC DEVICE CHECK: MDC IDC SESS DTM: 20161103180353

## 2015-09-17 NOTE — Progress Notes (Signed)
Electrophysiology Office Note   Date:  09/17/2015   ID:  Kelsey James, DOB 1941-05-08, MRN [id number]  PCP:  [place]  Cardiologist:  Lyman Bishop, Jolyn Nap Primary Electrophysiologist:  Avontae Burkhead [name], MD    Chief Complaint  Patient presents with  . Atrial ectopic tachycardia     History of Present Illness: Kelsey James is a 74 y.o. female who presents today for electrophysiology evaluation.   She has a diagnosis of atrial tachycardia and underwent loop recorder implantation to better qualify the palpitations she has been having. Her symptoms are relatively infrequent, the last being mid-April at which time her device documented atrial tach at rates around 200bpm that was relatively asymptomatic. She has had 2 episodes of tachycardia since then that have been very short in duration (<10 seconds) and have been associated with palpitations. She was intolerant of Flecainide and other medical therapy has been limited by bradycardia.  She had her most recent episodes when playing competitive bridge.  She has palpitations at that time.   Today, she denies symptoms of palpitations, chest pain, shortness of breath, orthopnea, PND, lower extremity edema, claudication, dizziness, presyncope, syncope, bleeding, or neurologic sequela. The patient is tolerating medications without difficulties and is otherwise without complaint today.    Past Medical History  Diagnosis Date  . Glaucoma   . Rosacea   . Dry eyes   . Arthritis   . Atrial ectopy   . Wears glasses   . Osteopenia    Past Surgical History  Procedure Laterality Date  . Tonsillectomy and adenoidectomy  age 89  . Tubal ligation    . Colonoscopy    . Mass excision Right 09/25/2014    Procedure: RIGHT RING FINGER EXCISION MASS AND DEBRIDEMENT DIP JOINT;  Surgeon: Leanora Cover, MD;  Location: [facility];  Service: Orthopedics;  Laterality: Right;  . Loop recorder  implant  09/14/2014    MDT LINQ implanted by Dr Caryl Comes for palpitations  . Left heart catheterization with coronary angiogram N/A 03/12/2014    Procedure: LEFT HEART CATHETERIZATION WITH CORONARY ANGIOGRAM;  Surgeon: Pixie Casino, MD;  Location: [facility] CATH LAB;  Service: Cardiovascular;  Laterality: N/A;  . Loop recorder implant N/A 09/15/2014    Procedure: LOOP RECORDER IMPLANT;  Surgeon: Deboraha Sprang, MD;  Location: [facility] CATH LAB;  Service: Cardiovascular;  Laterality: N/A;     Current Outpatient Prescriptions  Medication Sig Dispense Refill  . acetaminophen (TYLENOL) 500 MG tablet Take 500-1,000 mg by mouth every 6 (six) hours as needed (pain).    Marland Kitchen b complex vitamins capsule Take 1 capsule by mouth daily.    . Biotin 1000 MCG tablet Take 1,000 mcg by mouth daily.    . Calcium-Magnesium (CALCIUM MAGNESIUM 750) 300-300 MG TABS Take 2 tablets by mouth 2 (two) times daily.     . hydroxypropyl methylcellulose (ISOPTO TEARS) 2.5 % ophthalmic solution Place 1 drop into both eyes as needed for dry eyes.    . Hypochlorous Acid (AVENOVA) 0.01 % SOLN Apply 1 mL topically daily.    Marland Kitchen LECITHIN PO Take 1 capsule by mouth daily.    . LUTEIN PO Take 1 capsule by mouth daily.    Marland Kitchen MAGNESIUM PO Take 2 tablets by mouth 2 (two) times daily.     . Omega-3 Fatty Acids (FISH OIL PO) Take 1 capsule by mouth 2 (two) times daily.     Marland Kitchen OVER THE COUNTER MEDICATION Take 1  capsule by mouth 2 (two) times daily. Vegetal Silica    . OVER THE COUNTER MEDICATION Take 1 capsule by mouth 2 (two) times daily. Perfect Eyes supplement    . potassium chloride SA (K-DUR,KLOR-CON) 20 MEQ tablet Take 1 tablet (20 mEq total) by mouth 2 (two) times daily. 30 tablet 3  . XANAX 0.25 MG tablet TAKE 1 TABLET BY MOUTH AT BEDTIME AS NEEDED FOR ANXIETY 30 tablet 5  . ZIOPTAN 0.0015 % SOLN     . XIIDRA 5 % SOLN Place 1 drop into both eyes daily.  11   No current facility-administered medications for this visit.    Allergies:   Other and  Shellfish allergy   Social History:  The patient  reports that she quit smoking about 12 years ago. Her smoking use included Cigarettes. She has a 12.5 pack-year smoking history. She has never used smokeless tobacco. She reports that she drinks about 4.2 oz of alcohol per week. She reports that she does not use illicit drugs.   Family History:  The patient's family history includes Cancer in her maternal grandmother; Depression in her father; Valvular heart disease in her mother.    ROS:  Please see the history of present illness.   Otherwise, review of systems is positive for palpitations, dizziness, anxiety.   All other systems are reviewed and negative.    PHYSICAL EXAM: VS:  BP 124/82 mmHg  Pulse 58  Ht 5' 3.5" (1.613 m)  Wt 121 lb 3.2 oz (54.976 kg)  BMI 21.13 kg/m2 , BMI Body mass index is 21.13 kg/(m^2). GEN: Well nourished, well developed, in no acute distress HEENT: normal Neck: no JVD, carotid bruits, or masses Cardiac: RRR; no murmurs, rubs, or gallops,no edema  Respiratory:  clear to auscultation bilaterally, normal work of breathing GI: soft, nontender, nondistended, + BS MS: no deformity or atrophy Skin: warm and dry, device pocket is well healed Neuro:  Strength and sensation are intact Psych: euthymic mood, full affect  EKG:  EKG is ordered today. The ekg ordered today shows sinus rhythm, rate 58, short PR interval  Device interrogation is reviewed today in detail.  See PaceArt for details.  Paroxysmal atrial tachycardia on monitor   Recent Labs: 10/29/2014: BUN 8; Potassium 3.6; Sodium 142 04/14/2015: Creatinine, Ser 0.54    Lipid Panel  No results found for: CHOL, TRIG, HDL, CHOLHDL, VLDL, LDLCALC, LDLDIRECT   Wt Readings from Last 3 Encounters:  09/17/15 121 lb 3.2 oz (54.976 kg)  07/22/15 121 lb (54.885 kg)  05/14/15 121 lb 14.4 oz (55.293 kg)     ASSESSMENT AND PLAN:  1.  Atrial tachycardia: symptoms of palpitatins with tachcyardia.  She feels  that she would like this to be treated with ablation.  She has tried multiple medictations for this and has not tolerated them due to BP and pulse issues.  Have explained the procedure including risks and benefits.  Risks include bleeding, tamponade, stroke and the possibility of AV block.  She has agreed to the procedure.  Emersyn Kotarski schedule as soon as possible.    Current medicines are reviewed at length with the patient today.   The patient does not have concerns regarding her medicines.  The following changes were made today:  none  Labs/ tests ordered today include: EP study/ablation  No orders of the defined types were placed in this encounter.     Disposition:   FU with Hiran Leard post ablation  Signed, Marquelle Musgrave [name], MD  09/17/2015  3:17 PM     [place] [address] [phone] (office) [phone] (fax)

## 2015-09-17 NOTE — Patient Instructions (Addendum)
Medication Instructions:  Your physician recommends that you continue on your current medications as directed. Please refer to the Current Medication list given to you today.  Labwork: Pre procedure labs to be done on December 13.  You may stop by the office that day anytime between 8am and 5pm.  Testing/Procedures: Your physician has recommended that you have an ablation. Catheter ablation is a medical procedure used to treat some cardiac arrhythmias (irregular heartbeats). During catheter ablation, a long, thin, flexible tube is put into a blood vessel in your groin (upper thigh), or neck. This tube is called an ablation catheter. It is then guided to your heart through the blood vessel. Radio frequency waves destroy small areas of heart tissue where abnormal heartbeats may cause an arrhythmia to start. Please see the instruction sheet given to you today.  Follow-Up: Your physician recommends that you schedule a follow-up appointment in: 4 weeks with Tommye Standard, PA/Amber Lynnell Jude, NP after your ablation on 11/04/15.  Your physician recommends that you schedule a follow-up appointment in: 3 months with Dr. Curt Bears after your ablation on 11/04/15.  Any Other Special Instructions Will Be Listed Below (If Applicable).  If you need a refill on your cardiac medications before your next appointment, please call your pharmacy.  Thank you for choosing [facility]!!   Trinidad Curet, RN [phone]

## 2015-09-21 ENCOUNTER — Telehealth: Payer: Self-pay | Admitting: Cardiology

## 2015-09-21 NOTE — Addendum Note (Signed)
Addended by: Freada Bergeron on: 09/21/2015 05:21 PM   Modules accepted: Orders

## 2015-09-21 NOTE — Telephone Encounter (Signed)
New Message   Pt wants to know if they are going to turn off the device before surgery on 11/04/15

## 2015-09-23 ENCOUNTER — Encounter: Payer: Self-pay | Admitting: Internal Medicine

## 2015-09-24 NOTE — Telephone Encounter (Signed)
F/u    Pt is returning your call concerning her surgery.

## 2015-09-24 NOTE — Telephone Encounter (Signed)
Informed patient that we would not turn LINQ "off" for procedure.  Eased her mind to know that [employer] rep would be available if it were needed.  But explained that ablation does not require "turning off the device".  She verbalized understanding and thanks me for reassuring her.

## 2015-09-25 ENCOUNTER — Encounter: Payer: Self-pay | Admitting: Internal Medicine

## 2015-10-01 ENCOUNTER — Telehealth: Payer: Self-pay | Admitting: Cardiology

## 2015-10-01 NOTE — Telephone Encounter (Signed)
Patient requesting procedure name.  She is currently at store and can't right this down.  She would like procedure name emailed to her at:  [email or web address]. She is aware I will do this tomorrow.

## 2015-10-01 NOTE — Telephone Encounter (Signed)
New message      Pt want to know the name of the procedure she is having by Dr Curt Bears

## 2015-10-02 NOTE — Telephone Encounter (Signed)
Emailed patient name of procedure and  information about/describing procedure.

## 2015-10-09 LAB — CUP PACEART REMOTE DEVICE CHECK: Date Time Interrogation Session: 20161027203716

## 2015-10-09 NOTE — Progress Notes (Signed)
Carelink summary report received. Battery status OK. Normal device function. No new symptom, brady, pause, or AF episodes. 9 tachy episodes--AT per WC. Monthly summary reports, plan for AT ablation on 11/04/15, and ROV with WC on 02/02/16 at 2:30pm.

## 2015-10-12 ENCOUNTER — Ambulatory Visit (INDEPENDENT_AMBULATORY_CARE_PROVIDER_SITE_OTHER): Payer: Medicare Other | Admitting: *Deleted

## 2015-10-12 DIAGNOSIS — R002 Palpitations: Secondary | ICD-10-CM | POA: Diagnosis not present

## 2015-10-13 NOTE — Progress Notes (Signed)
Carelink Summary Report / Loop Recorder 

## 2015-10-27 ENCOUNTER — Other Ambulatory Visit (INDEPENDENT_AMBULATORY_CARE_PROVIDER_SITE_OTHER): Payer: Medicare Other | Admitting: *Deleted

## 2015-10-27 ENCOUNTER — Telehealth: Payer: Self-pay | Admitting: Cardiology

## 2015-10-27 DIAGNOSIS — I498 Other specified cardiac arrhythmias: Secondary | ICD-10-CM | POA: Diagnosis not present

## 2015-10-27 DIAGNOSIS — I471 Supraventricular tachycardia: Secondary | ICD-10-CM

## 2015-10-27 DIAGNOSIS — Z01812 Encounter for preprocedural laboratory examination: Secondary | ICD-10-CM | POA: Diagnosis not present

## 2015-10-27 LAB — CBC WITH DIFFERENTIAL/PLATELET
Basophils Absolute: 0.1 K/uL (ref 0.0–0.1)
Basophils Relative: 2 % — ABNORMAL HIGH (ref 0–1)
Eosinophils Absolute: 0.2 K/uL (ref 0.0–0.7)
Eosinophils Relative: 3 % (ref 0–5)
HCT: 39.5 % (ref 36.0–46.0)
Hemoglobin: 13.5 g/dL (ref 12.0–15.0)
Lymphocytes Relative: 31 % (ref 12–46)
Lymphs Abs: 1.6 K/uL (ref 0.7–4.0)
MCH: 30.2 pg (ref 26.0–34.0)
MCHC: 34.2 g/dL (ref 30.0–36.0)
MCV: 88.4 fL (ref 78.0–100.0)
MPV: 10 fL (ref 8.6–12.4)
Monocytes Absolute: 0.5 K/uL (ref 0.1–1.0)
Monocytes Relative: 10 % (ref 3–12)
Neutro Abs: 2.7 K/uL (ref 1.7–7.7)
Neutrophils Relative %: 54 % (ref 43–77)
Platelets: 255 K/uL (ref 150–400)
RBC: 4.47 MIL/uL (ref 3.87–5.11)
RDW: 14.9 % (ref 11.5–15.5)
WBC: 5 K/uL (ref 4.0–10.5)

## 2015-10-27 LAB — BASIC METABOLIC PANEL
BUN: 10 mg/dL (ref 7–25)
CO2: 29 mmol/L (ref 20–31)
Calcium: 9.2 mg/dL (ref 8.6–10.4)
Chloride: 99 mmol/L (ref 98–110)
Creat: 0.51 mg/dL — ABNORMAL LOW (ref 0.60–0.93)
GLUCOSE: 87 mg/dL (ref 65–99)
POTASSIUM: 4.1 mmol/L (ref 3.5–5.3)
SODIUM: 138 mmol/L (ref 135–146)

## 2015-10-27 NOTE — Telephone Encounter (Signed)
Spoke with pt. She would like to know what type of medication is used for sedation during ablation.  Also would like to know if husband needs to be there for entire time of procedure or if he can leave and come back.  He works about 5 minutes away. Pt made aware I would forward to [facility] and she would call her back tomorrow.

## 2015-10-27 NOTE — Telephone Encounter (Signed)
Patient was here today for Lab work for Ablation on 11-04-15 with Dr. Curt Bears.   She want you to gave her a call because she has several question regarding the procedure.

## 2015-10-28 ENCOUNTER — Encounter: Payer: Medicare Other | Admitting: Cardiology

## 2015-10-29 ENCOUNTER — Ambulatory Visit: Payer: Medicare Other | Admitting: Internal Medicine

## 2015-11-02 NOTE — Telephone Encounter (Addendum)
Informed patient of sedation used for procedure.  Also gave permission for husband to be gone during some of procedure - but he must have working phone for emergency contact. She states he will be arriving to hospital around lunch time. Patient verbalized understanding and agreeable to plan.

## 2015-11-03 ENCOUNTER — Telehealth: Payer: Self-pay | Admitting: *Deleted

## 2015-11-03 NOTE — Telephone Encounter (Signed)
No referral needed, order faxed. Pt will call to schedule.

## 2015-11-03 NOTE — Telephone Encounter (Signed)
-----   Message from Sinclair Grooms sent at 11/02/2015  2:50 PM EST ----- Regarding: referral Patient needs referral to [facility] for her bone density and mammogram.

## 2015-11-04 ENCOUNTER — Encounter (HOSPITAL_COMMUNITY): Payer: Self-pay | Admitting: General Practice

## 2015-11-04 ENCOUNTER — Ambulatory Visit (HOSPITAL_COMMUNITY)
Admission: RE | Admit: 2015-11-04 | Discharge: 2015-11-05 | Disposition: A | Discharge status: Home / Self Care | Payer: Medicare Other | Source: Ambulatory Visit | Attending: Cardiology | Admitting: Cardiology

## 2015-11-04 ENCOUNTER — Encounter (HOSPITAL_COMMUNITY)
Admission: RE | Disposition: A | Discharge status: Home / Self Care | Payer: Self-pay | Source: Ambulatory Visit | Attending: Cardiology

## 2015-11-04 DIAGNOSIS — Z91013 Allergy to seafood: Secondary | ICD-10-CM | POA: Insufficient documentation

## 2015-11-04 DIAGNOSIS — I471 Supraventricular tachycardia: Secondary | ICD-10-CM | POA: Insufficient documentation

## 2015-11-04 DIAGNOSIS — H409 Unspecified glaucoma: Secondary | ICD-10-CM | POA: Diagnosis not present

## 2015-11-04 DIAGNOSIS — Z87891 Personal history of nicotine dependence: Secondary | ICD-10-CM | POA: Insufficient documentation

## 2015-11-04 DIAGNOSIS — M8580 Other specified disorders of bone density and structure, unspecified site: Secondary | ICD-10-CM | POA: Insufficient documentation

## 2015-11-04 DIAGNOSIS — L719 Rosacea, unspecified: Secondary | ICD-10-CM | POA: Insufficient documentation

## 2015-11-04 DIAGNOSIS — I4719 Other supraventricular tachycardia: Secondary | ICD-10-CM | POA: Diagnosis present

## 2015-11-04 DIAGNOSIS — M1990 Unspecified osteoarthritis, unspecified site: Secondary | ICD-10-CM | POA: Diagnosis not present

## 2015-11-04 HISTORY — PX: ELECTROPHYSIOLOGIC STUDY: SHX172A

## 2015-11-04 HISTORY — PX: ATRIAL TACH ABLATION: SHX5734

## 2015-11-04 SURGERY — A-FLUTTER/A-TACH/SVT ABLATION
Anesthesia: LOCAL

## 2015-11-04 MED ORDER — SODIUM CHLORIDE 0.9 % IJ SOLN: 3.0000 mL | INTRAMUSCULAR | Status: DC | PRN

## 2015-11-04 MED ORDER — BUPIVACAINE HCL (PF) 0.25 % IJ SOLN
INTRAMUSCULAR | Status: AC
  Filled 2015-11-04: qty 30

## 2015-11-04 MED ORDER — POTASSIUM CHLORIDE CRYS ER 20 MEQ PO TBCR
20.0000 meq | EXTENDED_RELEASE_TABLET | Freq: Two times a day (BID) | ORAL | Status: DC
Start: 2015-11-04 — End: 2015-11-05
  Administered 2015-11-04 – 2015-11-05 (×2): 20 meq via ORAL
  Filled 2015-11-04 (×2): qty 1

## 2015-11-04 MED ORDER — SODIUM CHLORIDE 0.9 % IJ SOLN
3.0000 mL | Freq: Two times a day (BID) | INTRAMUSCULAR | Status: DC
  Administered 2015-11-05: 3 mL via INTRAVENOUS

## 2015-11-04 MED ORDER — HEPARIN (PORCINE) IN NACL 2-0.9 UNIT/ML-% IJ SOLN
INTRAMUSCULAR | Status: AC
  Filled 2015-11-04: qty 500

## 2015-11-04 MED ORDER — LIFITEGRAST 5 % OP SOLN: 1.0000 [drp] | Freq: Every day | OPHTHALMIC | Status: DC

## 2015-11-04 MED ORDER — MIDAZOLAM HCL 5 MG/5ML IJ SOLN
INTRAMUSCULAR | Status: AC
  Filled 2015-11-04: qty 5

## 2015-11-04 MED ORDER — ISOPROTERENOL HCL 0.2 MG/ML IJ SOLN
INTRAMUSCULAR | Status: AC
  Filled 2015-11-04: qty 5

## 2015-11-04 MED ORDER — LATANOPROST 0.005 % OP SOLN
1.0000 [drp] | Freq: Every day | OPHTHALMIC | Status: DC
  Filled 2015-11-04: qty 2.5

## 2015-11-04 MED ORDER — HEPARIN (PORCINE) IN NACL 2-0.9 UNIT/ML-% IJ SOLN
INTRAMUSCULAR | Status: DC | PRN
  Administered 2015-11-04: 12:00:00

## 2015-11-04 MED ORDER — FENTANYL CITRATE (PF) 100 MCG/2ML IJ SOLN
INTRAMUSCULAR | Status: DC | PRN
  Administered 2015-11-04 (×5): 25 ug via INTRAVENOUS

## 2015-11-04 MED ORDER — ACETAMINOPHEN 500 MG PO TABS: 500.0000 mg | ORAL_TABLET | Freq: Four times a day (QID) | ORAL | Status: DC | PRN

## 2015-11-04 MED ORDER — FENTANYL CITRATE (PF) 100 MCG/2ML IJ SOLN
INTRAMUSCULAR | Status: AC
  Filled 2015-11-04: qty 2

## 2015-11-04 MED ORDER — ONDANSETRON HCL 4 MG/2ML IJ SOLN: 4.0000 mg | Freq: Four times a day (QID) | INTRAMUSCULAR | Status: DC | PRN

## 2015-11-04 MED ORDER — OFF THE BEAT BOOK
Freq: Once | Status: AC
  Administered 2015-11-05: 06:00:00
  Filled 2015-11-04: qty 1

## 2015-11-04 MED ORDER — ALPRAZOLAM 0.5 MG PO TABS
0.5000 mg | ORAL_TABLET | Freq: Every day | ORAL | Status: DC
  Administered 2015-11-04: 20:00:00 0.5 mg via ORAL
  Filled 2015-11-04: qty 1

## 2015-11-04 MED ORDER — SODIUM CHLORIDE 0.9 % IV SOLN: 250.0000 mL | INTRAVENOUS | Status: DC | PRN

## 2015-11-04 MED ORDER — HYPROMELLOSE (GONIOSCOPIC) 2.5 % OP SOLN
1.0000 [drp] | Freq: Every day | OPHTHALMIC | Status: DC
  Filled 2015-11-04: qty 15

## 2015-11-04 MED ORDER — SODIUM CHLORIDE 0.9 % IV SOLN
1.0000 mg | INTRAVENOUS | Status: DC | PRN
  Administered 2015-11-04: 2 ug/min via INTRAVENOUS

## 2015-11-04 MED ORDER — MIDAZOLAM HCL 5 MG/5ML IJ SOLN
INTRAMUSCULAR | Status: DC | PRN
  Administered 2015-11-04 (×6): 1 mg via INTRAVENOUS

## 2015-11-04 MED ORDER — HYPROMELLOSE (GONIOSCOPIC) 2.5 % OP SOLN
1.0000 [drp] | OPHTHALMIC | Status: DC | PRN
  Filled 2015-11-04: qty 15

## 2015-11-04 MED ORDER — BUPIVACAINE HCL (PF) 0.25 % IJ SOLN
INTRAMUSCULAR | Status: DC | PRN
  Administered 2015-11-04: 31 mL

## 2015-11-04 MED ORDER — ACETAMINOPHEN 325 MG PO TABS
650.0000 mg | ORAL_TABLET | ORAL | Status: DC | PRN
  Administered 2015-11-05: 650 mg via ORAL
  Filled 2015-11-04: qty 2

## 2015-11-04 SURGICAL SUPPLY — 15 items
BAG SNAP BAND KOVER 36X36 (MISCELLANEOUS) ×2 IMPLANT
CATH EZ STEER NAV 4MM D-F CUR (ABLATOR) ×1 IMPLANT
CATH QUAD COURNAND 5FR (CATHETERS) ×1 IMPLANT
CATH QUAD JOSEPHSON 5FR (CATHETERS) ×1 IMPLANT
CATH WEBSTER BI DIR CS D-F CRV (CATHETERS) ×2 IMPLANT
PACK EP LATEX FREE (CUSTOM PROCEDURE TRAY) ×2
PACK EP LF (CUSTOM PROCEDURE TRAY) ×1 IMPLANT
PAD DEFIB LIFELINK (PAD) ×2 IMPLANT
PATCH CARTO3 (PAD) ×2 IMPLANT
SHEATH PINNACLE 6F 10CM (SHEATH) ×4 IMPLANT
SHEATH PINNACLE 7F 10CM (SHEATH) ×2 IMPLANT
SHEATH PINNACLE 8F 10CM (SHEATH) ×2 IMPLANT
SHEATH PINNACLE 9F 10CM (SHEATH) ×2 IMPLANT
SHEATH SWARTZ TS SL2 63CM 8.5F (SHEATH) ×1 IMPLANT
SHIELD RADPAD SCOOP 12X17 (MISCELLANEOUS) ×2 IMPLANT

## 2015-11-04 NOTE — H&P (Signed)
Electrophysiology Office Note   Date:  11/04/2015   ID:  Kelsey James, DOB [birth year], MRN [id number]  PCP:  [facility]  Cardiologist: Rosalva Ferron Primary Electrophysiologist:  Aerabella Galasso [name], MD    No chief complaint on file.    History of Present Illness: Kelsey James is a 74 y.o. female who presents today for electrophysiology evaluation.   She has a diagnosis of atrial tachycardia and underwent loop recorder implantation to better qualify the palpitations she has been having. Her symptoms are relatively infrequent, the last being mid-April at which time her device documented atrial tach at rates around 200bpm that was relatively asymptomatic. She has had 2 episodes of tachycardia since then that have been very short in duration (<10 seconds) and have been associated with palpitations. She was intolerant of Flecainide and other medical therapy has been limited by bradycardia. She had her most recent episodes when playing competitive bridge. Since being seen in clinic, she has continued to have palpitations.  They occur a few times per week.   Today, she denies symptoms of palpitations, chest pain, shortness of breath, orthopnea, PND, lower extremity edema, claudication, dizziness, presyncope, syncope, bleeding, or neurologic sequela. The patient is tolerating medications without difficulties and is otherwise without complaint today.    Past Medical History  Diagnosis Date  . Glaucoma   . Rosacea   . Dry eyes   . Arthritis   . Atrial ectopy   . Wears glasses   . Osteopenia    Past Surgical History  Procedure Laterality Date  . Tonsillectomy and adenoidectomy  age 33  . Tubal ligation    . Colonoscopy    . Mass excision Right 09/25/2014    Procedure: RIGHT RING FINGER EXCISION MASS AND DEBRIDEMENT DIP JOINT;  Surgeon: Leanora Cover, MD;  Location: [facility];  Service: Orthopedics;  Laterality: Right;  . Loop  recorder implant  09/14/2014    MDT LINQ implanted by Dr Caryl Comes for palpitations  . Left heart catheterization with coronary angiogram N/A 03/12/2014    Procedure: LEFT HEART CATHETERIZATION WITH CORONARY ANGIOGRAM;  Surgeon: Pixie Casino, MD;  Location: [facility] CATH LAB;  Service: Cardiovascular;  Laterality: N/A;  . Loop recorder implant N/A 09/15/2014    Procedure: LOOP RECORDER IMPLANT;  Surgeon: Deboraha Sprang, MD;  Location: [facility] CATH LAB;  Service: Cardiovascular;  Laterality: N/A;     No current facility-administered medications for this encounter.    Allergies:   Other and Shellfish allergy   Social History:  The patient  reports that she quit smoking about 12 years ago. Her smoking use included Cigarettes. She has a 12.5 pack-year smoking history. She has never used smokeless tobacco. She reports that she drinks about 4.2 oz of alcohol per week. She reports that she does not use illicit drugs.   Family History:  The patient's family history includes Cancer in her maternal grandmother; Depression in her father; Valvular heart disease in her mother.    ROS:  Please see the history of present illness.   All other systems are reviewed and negative.    PHYSICAL EXAM: VS:  BP 158/74 mmHg  Pulse 58  Temp(Src) 98 F (36.7 C) (Oral)  Resp 18  Ht 5' 3.5" (1.613 m)  Wt 121 lb (54.885 kg)  BMI 21.10 kg/m2  SpO2 98% , BMI Body mass index is 21.1 kg/(m^2). GEN: Well nourished, well developed, in no acute distress HEENT: normal Neck: no JVD,  carotid bruits, or masses Cardiac: RRR; no murmurs, rubs, or gallops,no edema  Respiratory:  clear to auscultation bilaterally, normal work of breathing GI: soft, nontender, nondistended, + BS MS: no deformity or atrophy Skin: warm and dry Neuro:  Strength and sensation are intact Psych: euthymic mood, full affect  EKG:  EKG is not ordered today.     Recent Labs: 10/27/2015: BUN 10; Creat 0.51*; Hemoglobin 13.5; Platelets 255; Potassium 4.1;  Sodium 138    Lipid Panel  No results found for: CHOL, TRIG, HDL, CHOLHDL, VLDL, LDLCALC, LDLDIRECT   Wt Readings from Last 3 Encounters:  11/04/15 121 lb (54.885 kg)  09/17/15 121 lb 3.2 oz (54.976 kg)  07/22/15 121 lb (54.885 kg)    ASSESSMENT AND PLAN:  1.  Atrial tachycardia: symptoms of palpitatins with tachcyardia. She has tried multiple medictations for this and has not tolerated them due to BP and pulse issues. Have explained the procedure including risks and benefits. Risks include bleeding, tamponade, stroke and the possibility of AV block. She has agreed to the procedure. Clea Dubach plan for today with CARTO mapping.     Labs/ tests ordered today include:  Orders Placed This Encounter  Procedures  . EP Study     Signed, Dam Ashraf [name], MD  11/04/2015 8:51 AM     [facility] [address] [place] [address] [phone] (office) [phone] (fax)

## 2015-11-04 NOTE — Progress Notes (Signed)
Site area: left groin fv sheaths x 2 Site Prior to Removal:  Level  0 Pressure Applied For:  10 minutes Manual:   yes Patient Status During Pull:  stable Post Pull Site:  Level  0 Post Pull Instructions Given:  yes Post Pull Pulses Present: yes Dressing Applied:  Small tegaderm Bedrest begins @  [id number] Comments: IV saline locked

## 2015-11-04 NOTE — Progress Notes (Addendum)
Site area: right groin fv  Sheaths X 2 Site Prior to Removal:  Level 0 Pressure Applied For:  15 minutes Manual:   yes Patient Status During Pull:  stable Post Pull Site:  Level  0 Post Pull Instructions Given:  yes Post Pull Pulses Present: yes Dressing Applied:  Small tegaderm  Bedrest begins @  Comments:

## 2015-11-05 ENCOUNTER — Encounter (HOSPITAL_COMMUNITY): Payer: Self-pay | Admitting: Cardiology

## 2015-11-05 DIAGNOSIS — L719 Rosacea, unspecified: Secondary | ICD-10-CM | POA: Diagnosis not present

## 2015-11-05 DIAGNOSIS — I471 Supraventricular tachycardia: Secondary | ICD-10-CM | POA: Diagnosis not present

## 2015-11-05 DIAGNOSIS — H409 Unspecified glaucoma: Secondary | ICD-10-CM | POA: Diagnosis not present

## 2015-11-05 DIAGNOSIS — M1990 Unspecified osteoarthritis, unspecified site: Secondary | ICD-10-CM | POA: Diagnosis not present

## 2015-11-05 NOTE — Discharge Summary (Signed)
ELECTROPHYSIOLOGY PROCEDURE DISCHARGE SUMMARY    Patient ID: Kelsey James,  MRN: [id number], DOB/AGE: [birth year] 74 y.o.  Admit date: 11/04/2015 Discharge date: 11/05/2015  Primary Care Physician: [facility] Primary Cardiologist: Dr. Debara Pickett Electrophysiologist: Dr. Curt Bears  Primary Discharge Diagnosis:  Atrial tachycardia status post ablation this admission  Secondary Discharge Diagnosis:  1. Glaucoma 2. Rosacea 3. Arthritis  Allergies  Allergen Reactions  . Other Nausea And Vomiting    Mushrooms   . Shellfish Allergy Nausea And Vomiting     Procedures This Admission: 1.  Electrophysiology study and radiofrequency catheter ablation on 11/04/15 by Dr Curt Bears.  This study demonstrated atrial tachycardia with successful ablation of AV nodal slow pathway.  There were no inducible arrhythmias following ablation and no early apparent complications.   Brief HPI: Kelsey James is a 74 y.o. female with a past medical history as outlined above.  She had documented atrial tachycardia, intolerant of medical therapy.  Risks, benefits, and alternatives to ablation were reviewed with the patient who wished to proceed.   Hospital Course:  The patient was admitted and underwent EPS/RFCA with details as outlined above. She was monitored on telemetry overnight which demonstrated atrial tachycardia.  Groin/sites were without complication.  Telemetry was reviewed, SB/SR 50-60bpm, normal PR interval, no AVblock or arrhythmias noted.  The patient was examined by Dr Lovena Le who considered her stable for discharge to home.  Follow up has been arranged for 4 weeks. She will continue loop transmissions.  Wound care and restrictions were reviewed with the patient prior to discharge.   Physical Exam: Filed Vitals:   11/04/15 1845 11/04/15 1900 11/04/15 1931 11/05/15 0545  BP: 130/73  114/64 101/61  Pulse: 60 67 57 54  Temp:   97.6 F (36.4 C) 98 F (36.7 C)    TempSrc:   Oral Oral  Resp: 13 19 13 13   Height:      Weight:    121 lb 4.1 oz (55 kg)  SpO2: 99% 100% 98% 94%    GEN- The patient is well appearing, alert and oriented x 3 today.   HEENT: normocephalic, atraumatic; sclera clear, conjunctiva pink; hearing intact; oropharynx clear; neck supple, no JVP Lymph- no cervical lymphadenopathy Lungs- Clear to ausculation bilaterally, normal work of breathing.  No wheezes, rales, rhonchi Heart- Regular rate and rhythm, no murmurs, rubs or gallops, PMI not laterally displaced GI- soft, non-tender, non-distended Extremities- are warm, no clubbing, cyanosis, or edema, b/l groin without hematoma/bruit MS- no significant deformity or atrophy Skin- warm and dry, no rash or lesion Psych- euthymic mood, full affect Neuro- strength and sensation are intact   Labs:   Lab Results  Component Value Date   WBC 5.0 10/27/2015   HGB 13.5 10/27/2015   HCT 39.5 10/27/2015   MCV 88.4 10/27/2015   PLT 255 10/27/2015   No results for input(s): NA, K, CL, CO2, BUN, CREATININE, CALCIUM, PROT, BILITOT, ALKPHOS, ALT, AST, GLUCOSE in the last 168 hours.  Invalid input(s): LABALBU  Discharge Medications:    Medication List    TAKE these medications        acetaminophen 500 MG tablet  Commonly known as:  TYLENOL  Take 500-1,000 mg by mouth every 6 (six) hours as needed (pain).     AVENOVA 0.01 % Soln  Generic drug:  Hypochlorous Acid  Apply 1 mL topically daily.     b complex vitamins capsule  Take 1 capsule by mouth daily.  Biotin 1000 MCG tablet  Take 1,000 mcg by mouth daily.     CALCIUM MAGNESIUM 750 300-300 MG Tabs  Generic drug:  Calcium-Magnesium  Take 2 tablets by mouth 2 (two) times daily.     FISH OIL PO  Take 1 capsule by mouth 2 (two) times daily.     hydroxypropyl methylcellulose / hypromellose 2.5 % ophthalmic solution  Commonly known as:  ISOPTO TEARS / GONIOVISC  Place 1 drop into both eyes as needed for dry eyes.      LECITHIN PO  Take 1 capsule by mouth daily.     LUTEIN PO  Take 1 capsule by mouth daily.     MAGNESIUM PO  Take 2 tablets by mouth 2 (two) times daily.     OVER THE COUNTER MEDICATION  Take 1 capsule by mouth 2 (two) times daily. Vegetal Silica     OVER THE COUNTER MEDICATION  Take 1 capsule by mouth 2 (two) times daily. Perfect Eyes supplement     potassium chloride SA 20 MEQ tablet  Commonly known as:  K-DUR,KLOR-CON  Take 1 tablet (20 mEq total) by mouth 2 (two) times daily.     XANAX 0.25 MG tablet  Generic drug:  ALPRAZolam  TAKE 1 TABLET BY MOUTH AT BEDTIME AS NEEDED FOR ANXIETY     XIIDRA 5 % Soln  Generic drug:  Lifitegrast  Place 1 drop into both eyes daily.     ZIOPTAN 0.0015 % Soln  Generic drug:  Tafluprost  Place 1 drop into both eyes daily.        Disposition: Home Follow-up Information    Follow up with Will [name], MD On 12/02/2015.   Specialty:  Cardiology   Why:  11:15AM   Contact information:   [place] [address] [phone]       Duration of Discharge Encounter: Greater than 30 minutes including physician time.  Venetia Night, PA-C 11/05/2015 7:41 AM   EP Attending  Patient seen and examined. Agree with the findings as noted above. [place] for discharge.   Mikle Bosworth.D.

## 2015-11-05 NOTE — Discharge Instructions (Signed)
No driving for 4 days. No lifting over 5 lbs for 1 week. No sexual activity for 1 week. You may return to work on 11/12/15. Keep procedure site clean & dry. If you notice increased pain, swelling, bleeding or pus, call/return!  You may shower, but no soaking baths/hot tubs/pools for 1 week.

## 2015-11-06 ENCOUNTER — Encounter: Payer: Self-pay | Admitting: Cardiology

## 2015-11-10 ENCOUNTER — Ambulatory Visit (INDEPENDENT_AMBULATORY_CARE_PROVIDER_SITE_OTHER): Payer: Medicare Other | Admitting: *Deleted

## 2015-11-10 DIAGNOSIS — R002 Palpitations: Secondary | ICD-10-CM | POA: Diagnosis not present

## 2015-11-10 NOTE — Progress Notes (Signed)
Carelink Summary Report / Loop Recorder 

## 2015-11-11 ENCOUNTER — Encounter: Payer: Self-pay | Admitting: Internal Medicine

## 2015-11-13 ENCOUNTER — Encounter: Payer: Self-pay | Admitting: Internal Medicine

## 2015-11-13 ENCOUNTER — Telehealth: Payer: Self-pay | Admitting: Cardiology

## 2015-11-13 NOTE — Telephone Encounter (Signed)
Close note

## 2015-11-13 NOTE — Telephone Encounter (Signed)
°  New Prob  Pt states she had an ablation on 11/04/15 and says she has noted some lumps around the incision site. Requesting to speak to a nurse. Please call.

## 2015-11-13 NOTE — Telephone Encounter (Signed)
Informed patient that this was not abnormal and explained what it was.  Advised her what to monitor for - pain or lump grows larger than walnut size, etc. States she thought nothing was wrong but wanted reassurance. Patient verbalized understanding.

## 2015-11-18 DIAGNOSIS — H01009 Unspecified blepharitis unspecified eye, unspecified eyelid: Secondary | ICD-10-CM | POA: Diagnosis not present

## 2015-11-18 DIAGNOSIS — H2513 Age-related nuclear cataract, bilateral: Secondary | ICD-10-CM | POA: Diagnosis not present

## 2015-11-18 DIAGNOSIS — H04123 Dry eye syndrome of bilateral lacrimal glands: Secondary | ICD-10-CM | POA: Diagnosis not present

## 2015-11-20 DIAGNOSIS — H4053X3 Glaucoma secondary to other eye disorders, bilateral, severe stage: Secondary | ICD-10-CM | POA: Diagnosis not present

## 2015-11-21 LAB — CUP PACEART REMOTE DEVICE CHECK: MDC IDC SESS DTM: 20161126203735

## 2015-11-25 ENCOUNTER — Encounter: Payer: Self-pay | Admitting: Internal Medicine

## 2015-11-25 ENCOUNTER — Ambulatory Visit (INDEPENDENT_AMBULATORY_CARE_PROVIDER_SITE_OTHER): Payer: Medicare Other | Admitting: Internal Medicine

## 2015-11-25 VITALS — BP 142/86 | HR 54 | Ht 63.5 in | Wt 123.8 lb

## 2015-11-25 DIAGNOSIS — R002 Palpitations: Secondary | ICD-10-CM

## 2015-11-25 DIAGNOSIS — I471 Supraventricular tachycardia: Secondary | ICD-10-CM | POA: Diagnosis not present

## 2015-11-25 NOTE — Patient Instructions (Signed)
Your physician recommends that you schedule a follow-up appointment in: 6 months with Dr.Hilty  

## 2015-11-25 NOTE — Progress Notes (Signed)
OFFICE NOTE  Chief Complaint:  Palpitations  Primary Care Physician: [facility]  HPI:  Kelsey James is a pleasant 75 year old female with little past medical history. For years she's had very low blood pressure however recently her blood pressure has spiked up into the upper 180's to over [id number] systolic. She recently presented to the emergency room and any pain with symptoms such as throbbing in her neck and head with uncontrolled blood pressure. Subsequently she was started on low-dose amlodipine and she's had a nice improvement in her blood pressure. She does however report lower extremity swelling in her feet. She also describes what I believe is chest tightness. She actually says that she feels that her chest is "caving in" or "deflating". There is some associated sporadic shortness of breath but it has improved with her blood pressure medicine. She does do some activity and exercise and reports that her symptoms improve with exercise.  Kelsey James had a recent nuclear stress test which demonstrated reversible distal anteroapical and anterolateral reversible ischemia. EF of 69%.  I interpreted as intermediate risk due to the distal location of the ischemia however is abnormal. She is relatively thin and I did not suspect this is due to attenuation artifact.  Ultimately she was referred for cardiac catheterization.  This was performed by myself and demonstrated normal coronary arteries. Subsequently she been having palpitations and I arranged for a monitor. She wore that monitor her for 2 weeks between 03/28/2014 and 04/11/2014. Total monitoring time was 288 hours and 50 minutes. Lowest heart rate was 41 and average heart rate was 59. She was noted to have PACs, short runs of PSVT up to 14 beats were also noted.  This likely explains her palpitations.  Given her bradycardia, I recommended discontinuing her timolol, which is the only medication  that I can see which could have been causing her bradycardia. She now presents today as a walk-in with feelings of her heart thumping. She was playing progressive bridge today and she started having heaviness in her chest. She felt like her heart was racing and she was short of breath, dizzy and like she was going to pass out. An EKG was performed in the office today which showed normal sinus rhythm at 71. Blood pressure was normal.  Kelsey James returns today for follow-up. In the interim she is seeing Dr. Jolyn Nap for evaluation of her palpitations and arrhythmia. She was felt to have 2 separate items. The first being palpitations and an ectopic atrial tachycardia with PACs which he felt were most likely benign given her normal coronary anatomy. The second was concerning more for atrial fibrillation, which relates to the episodes where she feels like her chest is "caving in". I discussed multiple ways to try to evaluate for arrhythmias, but given the infrequency of the events, ultimately he recommended an implantable loop recorder. She did undergo successful loop recorder placement and has had 2 transmissions that I reviewed, neither of which have demonstrated any atrial fibrillation. She continues to have some palpitations. She does related somewhat to caffeine and particular chocolate. She reports eating moose tracks ice cream every evening. I did ask that she may want to decrease some of her chocolate intake. Recently she's had problems with hypokalemia. We will plan to recheck a BMET today.  Kelsey James was seen by Dr. Caryl Comes after there was identification of a paroxysmal  atrial tachycardia on her implanted loop recorder. He recommended she start on flecainide 50 mg twice daily. Since starting this medicine she reports feeling horrible. Subsequently she followed up with Chanetta Marshall, NP, who recommended that she discontinue her flecainide. There is been consideration about alternative antiarrhythmics. At  this time she wishes to stay off of anti-rhythmic medication.  I had the pleasure seeing Kelsey James back today in the office. In the end of December she underwent ablation of an inducible atrial tachycardia by Dr. Curt Bears. This appears to been fairly successful at reducing her burden of atrial tachycardia. Although she still has had some breakthroughs. She does have an implantable loop recorder which will be interrogated this month. She has a follow-up appointment with him next week. Her only other concern is that she is having some labile blood pressures. She says she's gained 45 pounds over the holidays and notes her blood pressures a little higher. At times he goes up to 170 but generally ranges between 120 and 140. I do not necessarily see a need to treat that at this time. I asked her to keep records of her blood pressures over the next several weeks.  PMHx:  Past Medical History  Diagnosis Date  . Glaucoma   . Rosacea   . Dry eyes   . Atrial ectopy   . Wears glasses   . Osteopenia   . Arthritis     "hands" (11/04/2015)    Past Surgical History  Procedure Laterality Date  . Tonsillectomy and adenoidectomy  1979  . Tubal ligation    . Colonoscopy    . Mass excision Right 09/25/2014    Procedure: RIGHT RING FINGER EXCISION MASS AND DEBRIDEMENT DIP JOINT;  Surgeon: Leanora Cover, MD;  Location: [facility];  Service: Orthopedics;  Laterality: Right;  . Left heart catheterization with coronary angiogram N/A 03/12/2014    Procedure: LEFT HEART CATHETERIZATION WITH CORONARY ANGIOGRAM;  Surgeon: Pixie Casino, MD;  Location: [facility] CATH LAB;  Service: Cardiovascular;  Laterality: N/A;  . Loop recorder implant N/A 09/15/2014    Procedure: LOOP RECORDER IMPLANT;  Surgeon: Deboraha Sprang, MD;  Location: [facility] CATH LAB;  Service: Cardiovascular;  Laterality: N/A;  . Atrial tach ablation  11/04/2015  . Electrophysiologic study N/A 11/04/2015    Procedure: A-Tach Ablation;  Surgeon: Will  [name], MD;  Location: [facility] CV LAB;  Service: Cardiovascular;  Laterality: N/A;    FAMHx:  Family History  Problem Relation Age of Onset  . Valvular heart disease Mother   . Cancer Maternal Grandmother   . Depression Father     SOCHx:   reports that she quit smoking about 13 years ago. Her smoking use included Cigarettes. She has a 12.5 pack-year smoking history. She has never used smokeless tobacco. She reports that she drinks about 4.2 oz of alcohol per week. She reports that she does not use illicit drugs.  ALLERGIES:  Allergies  Allergen Reactions  . Other Nausea And Vomiting    Mushrooms   . Shellfish Allergy Nausea And Vomiting    ROS: A comprehensive review of systems was negative except for: Cardiovascular: positive for palpitations  HOME MEDS: Current Outpatient Prescriptions  Medication Sig Dispense Refill  . acetaminophen (TYLENOL) 500 MG tablet Take 500-1,000 mg by mouth every 6 (six) hours as needed (pain).    Marland Kitchen b complex vitamins capsule Take 1 capsule by mouth daily.    . Biotin 1000 MCG tablet Take 1,000 mcg by  mouth daily.    . Calcium-Magnesium (CALCIUM MAGNESIUM 750) 300-300 MG TABS Take 2 tablets by mouth 2 (two) times daily.     . Eyelid Cleansers (AVENOVA) 0.01 % SOLN Place 1 application into both eyes daily as needed.    . hydroxypropyl methylcellulose (ISOPTO TEARS) 2.5 % ophthalmic solution Place 1 drop into both eyes as needed for dry eyes.    . Hypochlorous Acid (AVENOVA) 0.01 % SOLN Apply 1 mL topically daily.    Marland Kitchen ketorolac (ACULAR) 0.5 % ophthalmic solution Place 1 drop into both eyes daily.  5  . LECITHIN PO Take 1 capsule by mouth daily.    . LUTEIN PO Take 1 capsule by mouth daily.    Marland Kitchen MAGNESIUM PO Take 2 tablets by mouth 2 (two) times daily.     . Omega-3 Fatty Acids (FISH OIL PO) Take 1 capsule by mouth 2 (two) times daily.     Marland Kitchen OVER THE COUNTER MEDICATION Take 1 capsule by mouth 2 (two) times daily. Vegetal Silica    .  OVER THE COUNTER MEDICATION Take 1 capsule by mouth 2 (two) times daily. Perfect Eyes supplement    . polyvinyl alcohol (ARTIFICIAL TEARS) 1.4 % ophthalmic solution Place 1 drop into both eyes 4 (four) times daily.    . potassium chloride SA (K-DUR,KLOR-CON) 20 MEQ tablet Take 1 tablet (20 mEq total) by mouth 2 (two) times daily. 30 tablet 3  . XANAX 0.25 MG tablet TAKE 1 TABLET BY MOUTH AT BEDTIME AS NEEDED FOR ANXIETY 30 tablet 5  . XIIDRA 5 % SOLN Place 1 drop into both eyes daily.  11  . ZIOPTAN 0.0015 % SOLN Place 1 drop into both eyes daily.      No current facility-administered medications for this visit.    LABS/IMAGING: No results found for this or any previous visit (from the past 48 hour(s)). No results found.  VITALS: BP 142/86 mmHg  Pulse 54  Ht 5' 3.5" (1.613 m)  Wt 123 lb 12.8 oz (56.155 kg)  BMI 21.58 kg/m2  EXAM: General appearance: alert and no distress Lungs: clear to auscultation bilaterally Abdomen: soft, non-tender; bowel sounds normal; no masses,  no organomegaly Extremities: extremities normal, atraumatic, no cyanosis or edema  EKG: Sinus bradycardia 54  ASSESSMENT: 1. Hypertension - not on medication 2. Chest pressure - normal coronary arteries by cardiac cath 3. Bradycardia - improved off timolol, s/p loop recorder implant 4. Dyspnea and exertion 5. Palpitations 6. Dizziness -presyncope 7. Hypokalemia  PLAN: 1.   Kelsey James has been having paroxysmal atrial tachycardia. She underwent successful ablation but still has some palpitations. This might be expected for several months. She is currently off of amlodipine which she previously took for blood pressure control. Recently her blood pressure has been climbing a little bit. She also has had some mild weight gain. I advised her to keep track of her blood pressure over the next several weeks and we may need to consider restarting low-dose amlodipine.  Pixie Casino, MD, [facility] Attending  Cardiologist [place] [facility] Davius [place] 11/25/2015, 1:41 PM

## 2015-12-01 NOTE — Progress Notes (Signed)
Electrophysiology Office Note   Date:  12/01/2015   ID:  DEILYN RAGATZ, DOB [birth year], MRN [id number]  PCP:  [facility]  Cardiologist:  [facility] Electrophysiologist:  Shavaun Osterloh [name], MD    No chief complaint on file.    History of Present Illness: Kelsey James is a 75 y.o. female who presents today for electrophysiology evaluation.   She had an atrial tachycardia ablation on 12/21.  She continues to have some breakthrough of tachycardia.  She says that her palpitations occur less frequently and less severe.  She feels occasional skipped and strong heart beats.  Otherwise, no complaints.   Today, she denies symptoms of chest pain, shortness of breath, orthopnea, PND, lower extremity edema, claudication, dizziness, presyncope, syncope, bleeding, or neurologic sequela. The patient is tolerating medications without difficulties and is otherwise without complaint today.    Past Medical History  Diagnosis Date  . Glaucoma   . Rosacea   . Dry eyes   . Atrial ectopy   . Wears glasses   . Osteopenia   . Arthritis     "hands" (11/04/2015)   Past Surgical History  Procedure Laterality Date  . Tonsillectomy and adenoidectomy  1979  . Tubal ligation    . Colonoscopy    . Mass excision Right 09/25/2014    Procedure: RIGHT RING FINGER EXCISION MASS AND DEBRIDEMENT DIP JOINT;  Surgeon: Leanora Cover, MD;  Location: [place];  Service: Orthopedics;  Laterality: Right;  . Left heart catheterization with coronary angiogram N/A 03/12/2014    Procedure: LEFT HEART CATHETERIZATION WITH CORONARY ANGIOGRAM;  Surgeon: Pixie Casino, MD;  Location: [facility] CATH LAB;  Service: Cardiovascular;  Laterality: N/A;  . Loop recorder implant N/A 09/15/2014    Procedure: LOOP RECORDER IMPLANT;  Surgeon: Deboraha Sprang, MD;  Location: [facility] CATH LAB;  Service: Cardiovascular;  Laterality: N/A;  . Atrial tach ablation  11/04/2015  . Electrophysiologic  study N/A 11/04/2015    Procedure: A-Tach Ablation;  Surgeon: Alvetta Hidrogo [name], MD;  Location: [place] CV LAB;  Service: Cardiovascular;  Laterality: N/A;     Current Outpatient Prescriptions  Medication Sig Dispense Refill  . acetaminophen (TYLENOL) 500 MG tablet Take 500-1,000 mg by mouth every 6 (six) hours as needed (pain).    Marland Kitchen b complex vitamins capsule Take 1 capsule by mouth daily.    . Biotin 1000 MCG tablet Take 1,000 mcg by mouth daily.    . Calcium-Magnesium (CALCIUM MAGNESIUM 750) 300-300 MG TABS Take 2 tablets by mouth 2 (two) times daily.     . Eyelid Cleansers (AVENOVA) 0.01 % SOLN Place 1 application into both eyes daily as needed.    . hydroxypropyl methylcellulose (ISOPTO TEARS) 2.5 % ophthalmic solution Place 1 drop into both eyes as needed for dry eyes.    . Hypochlorous Acid (AVENOVA) 0.01 % SOLN Apply 1 mL topically daily.    Marland Kitchen ketorolac (ACULAR) 0.5 % ophthalmic solution Place 1 drop into both eyes daily.  5  . LECITHIN PO Take 1 capsule by mouth daily.    . LUTEIN PO Take 1 capsule by mouth daily.    Marland Kitchen MAGNESIUM PO Take 2 tablets by mouth 2 (two) times daily.     . Omega-3 Fatty Acids (FISH OIL PO) Take 1 capsule by mouth 2 (two) times daily.     Marland Kitchen OVER THE COUNTER MEDICATION Take 1 capsule by mouth 2 (two) times daily. Vegetal Silica    .  OVER THE COUNTER MEDICATION Take 1 capsule by mouth 2 (two) times daily. Perfect Eyes supplement    . polyvinyl alcohol (ARTIFICIAL TEARS) 1.4 % ophthalmic solution Place 1 drop into both eyes 4 (four) times daily.    . potassium chloride SA (K-DUR,KLOR-CON) 20 MEQ tablet Take 1 tablet (20 mEq total) by mouth 2 (two) times daily. 30 tablet 3  . XANAX 0.25 MG tablet TAKE 1 TABLET BY MOUTH AT BEDTIME AS NEEDED FOR ANXIETY 30 tablet 5  . XIIDRA 5 % SOLN Place 1 drop into both eyes daily.  11  . ZIOPTAN 0.0015 % SOLN Place 1 drop into both eyes daily.      No current facility-administered medications for this visit.     Allergies:   Other and Shellfish allergy   Social History:  The patient  reports that she quit smoking about 13 years ago. Her smoking use included Cigarettes. She has a 12.5 pack-year smoking history. She has never used smokeless tobacco. She reports that she drinks about 4.2 oz of alcohol per week. She reports that she does not use illicit drugs.   Family History:  The patient's family history includes Cancer in her maternal grandmother; Depression in her father; Valvular heart disease in her mother.    ROS:  Please see the history of present illness.   Otherwise, review of systems is positive for palpitations, muscle and back pain.   All other systems are reviewed and negative.    PHYSICAL EXAM: VS:  There were no vitals taken for this visit. , BMI There is no weight on file to calculate BMI. GEN: Well nourished, well developed, in no acute distress HEENT: normal Neck: no JVD, carotid bruits, or masses Cardiac: RRR; no murmurs, rubs, or gallops,no edema  Respiratory:  clear to auscultation bilaterally, normal work of breathing GI: soft, nontender, nondistended, + BS MS: no deformity or atrophy Skin: warm and dry Neuro:  Strength and sensation are intact Psych: euthymic mood, full affect  EKG:  EKG is not ordered today.  Recent Labs: 10/27/2015: BUN 10; Creat 0.51*; Hemoglobin 13.5; Platelets 255; Potassium 4.1; Sodium 138    Lipid Panel  No results found for: CHOL, TRIG, HDL, CHOLHDL, VLDL, LDLCALC, LDLDIRECT   Wt Readings from Last 3 Encounters:  11/25/15 123 lb 12.8 oz (56.155 kg)  11/05/15 121 lb 4.1 oz (55 kg)  09/17/15 121 lb 3.2 oz (54.976 kg)    ASSESSMENT AND PLAN:  1.  Atrial tachycardia: An ablation of her tachycardia which was at 1:00 on the tricuspid valve. She has done well well since that time with only minor palpitations. Interrogation of her Linq shows that she has been in sinus rhythm without any abnormalities since ablation. We Athalie Newhard continue to  follow her through her Linq recorder to see if she has any further arrhythmias. We Oliviagrace Crisanti make no changes in her medications today.    Current medicines are reviewed at length with the patient today.   The patient does not have concerns regarding her medicines.  The following changes were made today:  none  Labs/ tests ordered today include:  No orders of the defined types were placed in this encounter.     Disposition:   FU with Katilynn Sinkler 3 months  Signed, Delynda Sepulveda [name], MD  12/01/2015 4:19 PM     [address] [place] [address] [phone] (office) [phone] (fax)

## 2015-12-02 ENCOUNTER — Encounter: Payer: Self-pay | Admitting: Cardiology

## 2015-12-02 ENCOUNTER — Encounter: Payer: Self-pay | Admitting: *Deleted

## 2015-12-02 ENCOUNTER — Ambulatory Visit (INDEPENDENT_AMBULATORY_CARE_PROVIDER_SITE_OTHER): Payer: Medicare Other | Admitting: Cardiology

## 2015-12-02 VITALS — BP 136/86 | HR 58 | Ht 63.5 in | Wt 123.2 lb

## 2015-12-02 DIAGNOSIS — I471 Supraventricular tachycardia: Secondary | ICD-10-CM | POA: Diagnosis not present

## 2015-12-02 LAB — CUP PACEART INCLINIC DEVICE CHECK: Date Time Interrogation Session: 20170118173646

## 2015-12-02 NOTE — Patient Instructions (Signed)
Medication Instructions:  Your physician recommends that you continue on your current medications as directed. Please refer to the Current Medication list given to you today.  Labwork: None ordered  Testing/Procedures: None ordered  Follow-Up: Keep your currently scheduled follow up on March 21 at 2:30 p.m. with Dr. Ileana Ladd.   If you need a refill on your cardiac medications before your next appointment, please call your pharmacy.  Thank you for choosing [facility]!!   Trinidad Curet, RN [phone]

## 2015-12-09 ENCOUNTER — Ambulatory Visit (INDEPENDENT_AMBULATORY_CARE_PROVIDER_SITE_OTHER): Payer: Medicare Other | Admitting: *Deleted

## 2015-12-09 DIAGNOSIS — R002 Palpitations: Secondary | ICD-10-CM

## 2015-12-10 NOTE — Progress Notes (Signed)
Carelink Summary Report / Loop Recorder 

## 2015-12-15 DIAGNOSIS — Z0000 Encounter for general adult medical examination without abnormal findings: Secondary | ICD-10-CM | POA: Diagnosis not present

## 2015-12-19 LAB — CUP PACEART REMOTE DEVICE CHECK: Date Time Interrogation Session: 20161223050500

## 2015-12-28 DIAGNOSIS — H4053X3 Glaucoma secondary to other eye disorders, bilateral, severe stage: Secondary | ICD-10-CM | POA: Diagnosis not present

## 2016-01-06 DIAGNOSIS — M85852 Other specified disorders of bone density and structure, left thigh: Secondary | ICD-10-CM | POA: Diagnosis not present

## 2016-01-06 DIAGNOSIS — Z1231 Encounter for screening mammogram for malignant neoplasm of breast: Secondary | ICD-10-CM | POA: Diagnosis not present

## 2016-01-07 ENCOUNTER — Encounter: Payer: Self-pay | Admitting: Gynecology

## 2016-01-08 ENCOUNTER — Ambulatory Visit (INDEPENDENT_AMBULATORY_CARE_PROVIDER_SITE_OTHER): Payer: Medicare Other | Admitting: *Deleted

## 2016-01-08 DIAGNOSIS — R002 Palpitations: Secondary | ICD-10-CM

## 2016-01-11 ENCOUNTER — Encounter: Payer: Self-pay | Admitting: Gynecology

## 2016-01-11 NOTE — Progress Notes (Signed)
Carelink Summary Report / Loop Recorder 

## 2016-01-12 ENCOUNTER — Encounter: Payer: Self-pay | Admitting: Internal Medicine

## 2016-01-13 ENCOUNTER — Encounter: Payer: Medicare Other | Admitting: Gynecology

## 2016-01-19 ENCOUNTER — Encounter: Payer: Medicare Other | Admitting: Gynecology

## 2016-01-21 DIAGNOSIS — H04123 Dry eye syndrome of bilateral lacrimal glands: Secondary | ICD-10-CM | POA: Diagnosis not present

## 2016-01-21 DIAGNOSIS — B399 Histoplasmosis, unspecified: Secondary | ICD-10-CM | POA: Diagnosis not present

## 2016-01-21 DIAGNOSIS — H2513 Age-related nuclear cataract, bilateral: Secondary | ICD-10-CM | POA: Diagnosis not present

## 2016-01-21 DIAGNOSIS — H32 Chorioretinal disorders in diseases classified elsewhere: Secondary | ICD-10-CM | POA: Diagnosis not present

## 2016-01-21 DIAGNOSIS — H40043 Steroid responder, bilateral: Secondary | ICD-10-CM | POA: Diagnosis not present

## 2016-01-21 DIAGNOSIS — H4053X3 Glaucoma secondary to other eye disorders, bilateral, severe stage: Secondary | ICD-10-CM | POA: Diagnosis not present

## 2016-01-27 ENCOUNTER — Ambulatory Visit (INDEPENDENT_AMBULATORY_CARE_PROVIDER_SITE_OTHER): Payer: Medicare Other | Admitting: Women's Health

## 2016-01-27 ENCOUNTER — Encounter: Payer: Self-pay | Admitting: Women's Health

## 2016-01-27 VITALS — BP 112/68 | Ht 63.5 in | Wt 122.0 lb

## 2016-01-27 DIAGNOSIS — G4700 Insomnia, unspecified: Secondary | ICD-10-CM | POA: Diagnosis not present

## 2016-01-27 DIAGNOSIS — Z01419 Encounter for gynecological examination (general) (routine) without abnormal findings: Secondary | ICD-10-CM

## 2016-01-27 DIAGNOSIS — M899 Disorder of bone, unspecified: Secondary | ICD-10-CM | POA: Diagnosis not present

## 2016-01-27 DIAGNOSIS — M8580 Other specified disorders of bone density and structure, unspecified site: Secondary | ICD-10-CM | POA: Insufficient documentation

## 2016-01-27 MED ORDER — ALPRAZOLAM 0.25 MG PO TABS: ORAL_TABLET | ORAL | Status: DC

## 2016-01-27 NOTE — Progress Notes (Signed)
Kelsey James 07-Apr-1941 [id number]    History:    Presents for breast and pelvic exam. Postmenopausal on no HRT with no bleeding. Normal Pap and mammogram history. Osteopenia 12/2015 DEXA T score -1.7 FRAX 9.9%/1.9%. Last colonoscopy negative. December 2016 had cardiac ablation for tachycardia now has a continuous heart monitor implanted. Hypertension. Continues to struggle with insomnia.  Past medical history, past surgical history, family history and social history were all reviewed and documented in the EPIC chart. 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  ROS:  A ROS was performed and pertinent positives and negatives are included.  Exam:  Filed Vitals:   01/27/16 1128  BP: 112/68    General appearance:  Normal Thyroid:  Symmetrical, normal in size, without palpable masses or nodularity. Respiratory  Auscultation:  Clear without wheezing or rhonchi Cardiovascular  Auscultation:  Regular rate, without rubs, murmurs or gallops  Edema/varicosities:  Not grossly evident Abdominal  Soft,nontender, without masses, guarding or rebound.  Liver/spleen:  No organomegaly noted  Hernia:  None appreciated  Skin  Inspection:  Grossly normal   Breasts: Examined lying and sitting.     Right: Without masses, retractions, discharge or axillary adenopathy.     Left: Without masses, retractions, discharge or axillary adenopathy. Gentitourinary   Inguinal/mons:  Normal without inguinal adenopathy  External genitalia:  Normal  BUS/Urethra/Skene's glands:  Normal  Vagina:  Atrophic  Cervix:  Normal  Uterus:  normal in size, shape and contour.  Midline and mobile  Adnexa/parametria:     Rt: Without masses or tenderness.   Lt: Without masses or tenderness.  Anus and perineum: Normal  Digital rectal exam: Normal sphincter tone without palpated masses or tenderness  Assessment/Plan:  74 y.o. MWF G2 P2 for rest and pelvic exam with no  complaints.  Postmenopausal/no HRT/no bleeding/not sexually active Asymptomatic vaginal atrophy Insomnia Hypertension and atrial tachycardia-cardiologist manages Primary care manages labs Osteopenia without elevated FRAX  Plan: Insomnia discussed importance of sleep hygiene, Xanax 0.2 5 take 1/2 tablet at bedtime when necessary, reviewed importance of no driving to use sparingly not daily, addictive properties reviewed. Prescription, proper use given and reviewed. SBE's, continue annual 3-D screening mammogram history of dense breasts. Home safety, fall prevention and importance of weightbearing exercise reviewed encouraged yoga, common and not as stay on her feet as she has been.   Huel Cote WHNP, 1:12 PM 01/27/2016

## 2016-01-27 NOTE — Patient Instructions (Signed)
Insomnia Insomnia is a sleep disorder that makes it difficult to fall asleep or to stay asleep. Insomnia can cause tiredness (fatigue), low energy, difficulty concentrating, mood swings, and poor performance at work or school.  There are three different ways to classify insomnia:  Difficulty falling asleep.  Difficulty staying asleep.  Waking up too early in the morning. Any type of insomnia can be long-term (chronic) or short-term (acute). Both are common. Short-term insomnia usually lasts for three months or less. Chronic insomnia occurs at least three times a week for longer than three months. CAUSES  Insomnia may be caused by another condition, situation, or substance, such as:  Anxiety.  Certain medicines.  Gastroesophageal reflux disease (GERD) or other gastrointestinal conditions.  Asthma or other breathing conditions.  Restless legs syndrome, sleep apnea, or other sleep disorders.  Chronic pain.  Menopause. This may include hot flashes.  Stroke.  Abuse of alcohol, tobacco, or illegal drugs.  Depression.  Caffeine.   Neurological disorders, such as Alzheimer disease.  An overactive thyroid (hyperthyroidism). The cause of insomnia may not be known. RISK FACTORS Risk factors for insomnia include:  Gender. Women are more commonly affected than men.  Age. Insomnia is more common as you get older.  Stress. This may involve your professional or personal life.  Income. Insomnia is more common in people with lower income.  Lack of exercise.   Irregular work schedule or night shifts.  Traveling between different time zones. SIGNS AND SYMPTOMS If you have insomnia, trouble falling asleep or trouble staying asleep is the main symptom. This may lead to other symptoms, such as:  Feeling fatigued.  Feeling nervous about going to sleep.  Not feeling rested in the morning.  Having trouble concentrating.  Feeling irritable, anxious, or depressed. TREATMENT   Treatment for insomnia depends on the cause. If your insomnia is caused by an underlying condition, treatment will focus on addressing the condition. Treatment may also include:   Medicines to help you sleep.  Counseling or therapy.  Lifestyle adjustments. HOME CARE INSTRUCTIONS   Take medicines only as directed by your health care provider.  Keep regular sleeping and waking hours. Avoid naps.  Keep a sleep diary to help you and your health care provider figure out what could be causing your insomnia. Include:   When you sleep.  When you wake up during the night.  How well you sleep.   How rested you feel the next day.  Any side effects of medicines you are taking.  What you eat and drink.   Make your bedroom a comfortable place where it is easy to fall asleep:  Put up shades or special blackout curtains to block light from outside.  Use a white noise machine to block noise.  Keep the temperature cool.   Exercise regularly as directed by your health care provider. Avoid exercising right before bedtime.  Use relaxation techniques to manage stress. Ask your health care provider to suggest some techniques that may work well for you. These may include:  Breathing exercises.  Routines to release muscle tension.  Visualizing peaceful scenes.  Cut back on alcohol, caffeinated beverages, and cigarettes, especially close to bedtime. These can disrupt your sleep.  Do not overeat or eat spicy foods right before bedtime. This can lead to digestive discomfort that can make it hard for you to sleep.  Limit screen use before bedtime. This includes:  Watching TV.  Using your smartphone, tablet, and computer.  Stick to a routine. This  can help you fall asleep faster. Try to do a quiet activity, brush your teeth, and go to bed at the same time each night.  Get out of bed if you are still awake after 15 minutes of trying to sleep. Keep the lights down, but try reading or  doing a quiet activity. When you feel sleepy, go back to bed.  Make sure that you drive carefully. Avoid driving if you feel very sleepy.  Keep all follow-up appointments as directed by your health care provider. This is important. SEEK MEDICAL CARE IF:   You are tired throughout the day or have trouble in your daily routine due to sleepiness.  You continue to have sleep problems or your sleep problems get worse. SEEK IMMEDIATE MEDICAL CARE IF:   You have serious thoughts about hurting yourself or someone else.   This information is not intended to replace advice given to you by your health care provider. Make sure you discuss any questions you have with your health care provider.   Document Released: 10/28/2000 Document Revised: 07/22/2015 Document Reviewed: 08/01/2014 Elsevier Interactive Patient Education [employer]. Menopause is a normal process in which your reproductive ability comes to an end. This process happens gradually over a span of months to years, usually between the ages of 56 and 71. Menopause is complete when you have missed 12 consecutive menstrual periods. It is important to talk with your health care provider about some of the most common conditions that affect postmenopausal women, such as heart disease, cancer, and bone loss (osteoporosis). Adopting a healthy lifestyle and getting preventive care can help to promote your health and wellness. Those actions can also lower your chances of developing some of these common conditions. WHAT SHOULD I KNOW ABOUT MENOPAUSE? During menopause, you may experience a number of symptoms, such as:  Moderate-to-severe hot flashes.  Night sweats.  Decrease in sex drive.  Mood swings.  Headaches.  Tiredness.  Irritability.  Memory problems.  Insomnia. Choosing to treat or not to treat menopausal changes is an individual decision that you make with your health care provider. WHAT SHOULD I KNOW ABOUT HORMONE  REPLACEMENT THERAPY AND SUPPLEMENTS? Hormone therapy products are effective for treating symptoms that are associated with menopause, such as hot flashes and night sweats. Hormone replacement carries certain risks, especially as you become older. If you are thinking about using estrogen or estrogen with progestin treatments, discuss the benefits and risks with your health care provider. WHAT SHOULD I KNOW ABOUT HEART DISEASE AND STROKE? Heart disease, heart attack, and stroke become more likely as you age. This may be due, in part, to the hormonal changes that your body experiences during menopause. These can affect how your body processes dietary fats, triglycerides, and cholesterol. Heart attack and stroke are both medical emergencies. There are many things that you can do to help prevent heart disease and stroke:  Have your blood pressure checked at least every 1-2 years. High blood pressure causes heart disease and increases the risk of stroke.  If you are 41-24 years old, ask your health care provider if you should take aspirin to prevent a heart attack or a stroke.  Do not use any tobacco products, including cigarettes, chewing tobacco, or electronic cigarettes. If you need help quitting, ask your health care provider.  It is important to eat a healthy diet and maintain a healthy weight.  Be sure to include plenty of vegetables, fruits, low-fat dairy products, and lean protein.  Avoid eating  foods that are high in solid fats, added sugars, or salt (sodium).  Get regular exercise. This is one of the most important things that you can do for your health.  Try to exercise for at least 150 minutes each week. The type of exercise that you do should increase your heart rate and make you sweat. This is known as moderate-intensity exercise.  Try to do strengthening exercises at least twice each week. Do these in addition to the moderate-intensity exercise.  Know your numbers.Ask your health  care provider to check your cholesterol and your blood glucose. Continue to have your blood tested as directed by your health care provider. WHAT SHOULD I KNOW ABOUT CANCER SCREENING? There are several types of cancer. Take the following steps to reduce your risk and to catch any cancer development as early as possible. Breast Cancer  Practice breast self-awareness.  This means understanding how your breasts normally appear and feel.  It also means doing regular breast self-exams. Let your health care provider know about any changes, no matter how small.  If you are 41 or older, have a clinician do a breast exam (clinical breast exam or CBE) every year. Depending on your age, family history, and medical history, it may be recommended that you also have a yearly breast X-ray (mammogram).  If you have a family history of breast cancer, talk with your health care provider about genetic screening.  If you are at high risk for breast cancer, talk with your health care provider about having an MRI and a mammogram every year.  Breast cancer (BRCA) gene test is recommended for women who have family members with BRCA-related cancers. Results of the assessment will determine the need for genetic counseling and BRCA1 and for BRCA2 testing. BRCA-related cancers include these types:  Breast. This occurs in males or females.  Ovarian.  Tubal. This may also be called fallopian tube cancer.  Cancer of the abdominal or pelvic lining (peritoneal cancer).  Prostate.  Pancreatic. Cervical, Uterine, and Ovarian Cancer Your health care provider may recommend that you be screened regularly for cancer of the pelvic organs. These include your ovaries, uterus, and vagina. This screening involves a pelvic exam, which includes checking for microscopic changes to the surface of your cervix (Pap test).  For women ages 21-65, health care providers may recommend a pelvic exam and a Pap test every three years. For  women ages 19-65, they may recommend the Pap test and pelvic exam, combined with testing for human papilloma virus (HPV), every five years. Some types of HPV increase your risk of cervical cancer. Testing for HPV may also be done on women of any age who have unclear Pap test results.  Other health care providers may not recommend any screening for nonpregnant women who are considered low risk for pelvic cancer and have no symptoms. Ask your health care provider if a screening pelvic exam is right for you.  If you have had past treatment for cervical cancer or a condition that could lead to cancer, you need Pap tests and screening for cancer for at least 20 years after your treatment. If Pap tests have been discontinued for you, your risk factors (such as having a new sexual partner) need to be reassessed to determine if you should start having screenings again. Some women have medical problems that increase the chance of getting cervical cancer. In these cases, your health care provider may recommend that you have screening and Pap tests more often.  If you have a family history of uterine cancer or ovarian cancer, talk with your health care provider about genetic screening.  If you have vaginal bleeding after reaching menopause, tell your health care provider.  There are currently no reliable tests available to screen for ovarian cancer. Lung Cancer Lung cancer screening is recommended for adults 76-51 years old who are at high risk for lung cancer because of a history of smoking. A yearly low-dose CT scan of the lungs is recommended if you:  Currently smoke.  Have a history of at least 30 pack-years of smoking and you currently smoke or have quit within the past 15 years. A pack-year is smoking an average of one pack of cigarettes per day for one year. Yearly screening should:  Continue until it has been 15 years since you quit.  Stop if you develop a health problem that would prevent you from  having lung cancer treatment. Colorectal Cancer  This type of cancer can be detected and can often be prevented.  Routine colorectal cancer screening usually begins at age 46 and continues through age 5.  If you have risk factors for colon cancer, your health care provider may recommend that you be screened at an earlier age.  If you have a family history of colorectal cancer, talk with your health care provider about genetic screening.  Your health care provider may also recommend using home test kits to check for hidden blood in your stool.  A small camera at the end of a tube can be used to examine your colon directly (sigmoidoscopy or colonoscopy). This is done to check for the earliest forms of colorectal cancer.  Direct examination of the colon should be repeated every 5-10 years until age 48. However, if early forms of precancerous polyps or small growths are found or if you have a family history or genetic risk for colorectal cancer, you may need to be screened more often. Skin Cancer  Check your skin from head to toe regularly.  Monitor any moles. Be sure to tell your health care provider:  About any new moles or changes in moles, especially if there is a change in a mole's shape or color.  If you have a mole that is larger than the size of a pencil eraser.  If any of your family members has a history of skin cancer, especially at a Malaia Buchta age, talk with your health care provider about genetic screening.  Always use sunscreen. Apply sunscreen liberally and repeatedly throughout the day.  Whenever you are outside, protect yourself by wearing long sleeves, pants, a wide-brimmed hat, and sunglasses. WHAT SHOULD I KNOW ABOUT OSTEOPOROSIS? Osteoporosis is a condition in which bone destruction happens more quickly than new bone creation. After menopause, you may be at an increased risk for osteoporosis. To help prevent osteoporosis or the bone fractures that can happen because of  osteoporosis, the following is recommended:  If you are 84-14 years old, get at least 1,000 mg of calcium and at least 600 mg of vitamin D per day.  If you are older than age 41 but younger than age 61, get at least 1,200 mg of calcium and at least 600 mg of vitamin D per day.  If you are older than age 88, get at least 1,200 mg of calcium and at least 800 mg of vitamin D per day. Smoking and excessive alcohol intake increase the risk of osteoporosis. Eat foods that are rich in calcium and vitamin D, and  do weight-bearing exercises several times each week as directed by your health care provider. WHAT SHOULD I KNOW ABOUT HOW MENOPAUSE AFFECTS [place]? Depression may occur at any age, but it is more common as you become older. Common symptoms of depression include:  Low or sad mood.  Changes in sleep patterns.  Changes in appetite or eating patterns.  Feeling an overall lack of motivation or enjoyment of activities that you previously enjoyed.  Frequent crying spells. Talk with your health care provider if you think that you are experiencing depression. WHAT SHOULD I KNOW ABOUT IMMUNIZATIONS? It is important that you get and maintain your immunizations. These include:  Tetanus, diphtheria, and pertussis (Tdap) booster vaccine.  Influenza every year before the flu season begins.  Pneumonia vaccine.  Shingles vaccine. Your health care provider may also recommend other immunizations.   This information is not intended to replace advice given to you by your health care provider. Make sure you discuss any questions you have with your health care provider.   Document Released: 12/23/2005 Document Revised: 11/21/2014 Document Reviewed: 07/03/2014 Elsevier Interactive Patient Education [employer].

## 2016-01-29 LAB — CUP PACEART REMOTE DEVICE CHECK: MDC IDC SESS DTM: 20170125210713

## 2016-01-29 NOTE — Progress Notes (Signed)
Carelink summary report received. Battery status OK. Normal device function. No new tachy episodes, brady, or pause episodes. No new AF episodes. 33 symptom recordings- 4 with available ECG that appear SB-SR 46-80 BPM, occasional PVC. Monthly summary reports and ROV/PRN

## 2016-02-01 NOTE — Progress Notes (Addendum)
Electrophysiology Office Note   Date:  02/02/2016   ID:  Kelsey James, DOB Dec 23, 1940, MRN [id number]  PCP:  [facility]  Cardiologist:  [facility] Electrophysiologist:  Kelsey Laforte [name], MD    Chief Complaint  Patient presents with  . Pacemaker Check     History of Present Illness: Kelsey James is a 75 y.o. female who presents today for electrophysiology evaluation.   She had an atrial tachycardia ablation on 12/21.  She feels well today, but does have occasional palpitations. When looking at her event monitor, the palpitations are due to both PVCs and APCs. They are rare. She also had one run of 15 beats of a narrow complex tachycardia, but she was not symptomatic from this.  Today, she denies symptoms of chest pain, shortness of breath, orthopnea, PND, lower extremity edema, claudication, dizziness, presyncope, syncope, bleeding, or neurologic sequela. The patient is tolerating medications without difficulties and is otherwise without complaint today.    Past Medical History  Diagnosis Date  . Glaucoma   . Rosacea   . Dry eyes   . Wears glasses   . Osteopenia   . Arthritis     "hands" (11/04/2015)   Past Surgical History  Procedure Laterality Date  . Tonsillectomy and adenoidectomy  1979  . Tubal ligation    . Colonoscopy    . Mass excision Right 09/25/2014    Procedure: RIGHT RING FINGER EXCISION MASS AND DEBRIDEMENT DIP JOINT;  Surgeon: Kelsey Cover, MD;  Location: [facility];  Service: Orthopedics;  Laterality: Right;  . Left heart catheterization with coronary angiogram N/A 03/12/2014    Procedure: LEFT HEART CATHETERIZATION WITH CORONARY ANGIOGRAM;  Surgeon: Kelsey Casino, MD;  Location: [facility] CATH LAB;  Service: Cardiovascular;  Laterality: N/A;  . Loop recorder implant N/A 09/15/2014    Procedure: LOOP RECORDER IMPLANT;  Surgeon: Kelsey Sprang, MD;  Location: [facility] CATH LAB;  Service: Cardiovascular;   Laterality: N/A;  . Atrial tach ablation  11/04/2015  . Electrophysiologic study N/A 11/04/2015    Procedure: A-Tach Ablation;  Surgeon: Kelsey Mangino [name], MD;  Location: [facility] CV LAB;  Service: Cardiovascular;  Laterality: N/A;     Current Outpatient Prescriptions  Medication Sig Dispense Refill  . acetaminophen (TYLENOL) 500 MG tablet Take 500-1,000 mg by mouth every 6 (six) hours as needed (pain).    Marland Kitchen ALPRAZolam (XANAX) 0.25 MG tablet TAKE 1 TABLET BY MOUTH AT BEDTIME AS NEEDED FOR ANXIETY 30 tablet 1  . Biotin 1000 MCG tablet Take 1,000 mcg by mouth daily.    . Calcium-Magnesium (CALCIUM MAGNESIUM 750) 300-300 MG TABS Take 1 tablet by mouth 2 (two) times daily.     . Eyelid Cleansers (AVENOVA) 0.01 % SOLN Place 1 application into both eyes daily as needed.    . hydroxypropyl methylcellulose (ISOPTO TEARS) 2.5 % ophthalmic solution Place 1 drop into both eyes as needed for dry eyes.    Marland Kitchen LECITHIN PO Take 1 capsule by mouth daily.    Marland Kitchen MAGNESIUM PO Take 1 tablet by mouth 2 (two) times daily.     . Omega-3 Fatty Acids (FISH OIL PO) Take 1 capsule by mouth 2 (two) times daily.     Marland Kitchen OVER THE COUNTER MEDICATION Take 1 capsule by mouth 2 (two) times daily. Vegetal Silica    . OVER THE COUNTER MEDICATION Take 1 capsule by mouth 2 (two) times daily. Perfect Eyes supplement    . polyvinyl alcohol (  ARTIFICIAL TEARS) 1.4 % ophthalmic solution Place 1 drop into both eyes 4 (four) times daily.    Marland Kitchen ZIOPTAN 0.0015 % SOLN Place 1 drop into both eyes daily.      No current facility-administered medications for this visit.    Allergies:   Other and Shellfish allergy   Social History:  The patient  reports that she quit smoking about 13 years ago. Her smoking use included Cigarettes. She has a 12.5 pack-year smoking history. She has never used smokeless tobacco. She reports that she drinks about 4.2 oz of alcohol per week. She reports that she does not use illicit drugs.   Family History:   The patient's family history includes Cancer in her maternal grandmother; Depression in her father; Valvular heart disease in her mother.    ROS:  Please see the history of present illness.   Otherwise, review of systems is positive for intermittent chest pounding.   All other systems are reviewed and negative.    PHYSICAL EXAM: VS:  BP 138/82 mmHg  Pulse 62  Ht 5' 3.5" (1.613 m)  Wt 125 lb 3.2 oz (56.79 kg)  BMI 21.83 kg/m2 , BMI Body mass index is 21.83 kg/(m^2). GEN: Well nourished, well developed, in no acute distress HEENT: normal Neck: no JVD, carotid bruits, or masses Cardiac: RRR; no murmurs, rubs, or gallops,no edema  Respiratory:  clear to auscultation bilaterally, normal work of breathing GI: soft, nontender, nondistended, + BS MS: no deformity or atrophy Skin: warm and dry Neuro:  Strength and sensation are intact Psych: euthymic mood, full affect  EKG:  EKG is ordered today. Sinus rhythm, rate 62  Device interrogation reviewed and can be evaluated in [facility]  Recent Labs: 10/27/2015: BUN 10; Creat 0.51*; Hemoglobin 13.5; Platelets 255; Potassium 4.1; Sodium 138    Lipid Panel  No results found for: CHOL, TRIG, HDL, CHOLHDL, VLDL, LDLCALC, LDLDIRECT   Wt Readings from Last 3 Encounters:  02/02/16 125 lb 3.2 oz (56.79 kg)  01/27/16 122 lb (55.339 kg)  12/02/15 123 lb 3.2 oz (55.883 kg)    ASSESSMENT AND PLAN:  1.  Atrial tachycardia: An ablation of her tachycardia which was at 1:00 on the tricuspid valve.  She is feeling well today, but has had some chest pounding and one run of SVT. We'll therefore start her on carvedilol, as her blood pressure is been high at home.  2. Hypertension: Blood pressure today is at the high end of normal and blood pressures at home and been ranging in the 150s to 170s. We'll start her on 6.25 of carvedilol to see if this Kelsey James help her blood pressure and her chest pounding  with APCs and PVCs.    Current medicines are reviewed at  length with the patient today.   The patient does not have concerns regarding her medicines.  The following changes were made today: coreg 6.25 mg BID  Labs/ tests ordered today include:   No orders of the defined types were placed in this encounter.     Disposition:   FU with Nuel Dejaynes 6  months  Signed, Frazier Balfour [name], MD  02/02/2016 2:56 PM     [facility] [address] [place] [address] [phone] (office) [phone] (fax)

## 2016-02-02 ENCOUNTER — Ambulatory Visit (INDEPENDENT_AMBULATORY_CARE_PROVIDER_SITE_OTHER): Payer: Medicare Other | Admitting: Cardiology

## 2016-02-02 ENCOUNTER — Encounter: Payer: Self-pay | Admitting: Cardiology

## 2016-02-02 VITALS — BP 138/82 | HR 62 | Ht 63.5 in | Wt 125.2 lb

## 2016-02-02 DIAGNOSIS — I471 Supraventricular tachycardia: Secondary | ICD-10-CM | POA: Diagnosis not present

## 2016-02-02 MED ORDER — CARVEDILOL 6.25 MG PO TABS: 6.2500 mg | ORAL_TABLET | Freq: Two times a day (BID) | ORAL | Status: DC

## 2016-02-02 NOTE — Patient Instructions (Signed)
Medication Instructions:  Your physician has recommended you make the following change in your medication:  1) START Carvedilol 6.25 mg twice a day  Labwork: None ordered  Testing/Procedures: None ordered  Follow-Up: Your physician wants you to follow-up in: 6 months with Dr. Curt Bears. You will receive a reminder letter in the mail two months in advance. If you don't receive a letter, please call our office to schedule the follow-up appointment.  If you need a refill on your cardiac medications before your next appointment, please call your pharmacy.  Thank you for choosing [facility]!!   Trinidad Curet, RN [phone]

## 2016-02-04 LAB — CUP PACEART INCLINIC DEVICE CHECK: MDC IDC SESS DTM: 20170321185028

## 2016-02-08 ENCOUNTER — Ambulatory Visit (INDEPENDENT_AMBULATORY_CARE_PROVIDER_SITE_OTHER): Payer: Medicare Other | Admitting: *Deleted

## 2016-02-08 DIAGNOSIS — R002 Palpitations: Secondary | ICD-10-CM | POA: Diagnosis not present

## 2016-02-08 NOTE — Progress Notes (Signed)
Carelink Summary Report / Loop Recorder 

## 2016-02-10 DIAGNOSIS — B399 Histoplasmosis, unspecified: Secondary | ICD-10-CM | POA: Diagnosis not present

## 2016-02-10 DIAGNOSIS — H04123 Dry eye syndrome of bilateral lacrimal glands: Secondary | ICD-10-CM | POA: Diagnosis not present

## 2016-02-10 DIAGNOSIS — H2513 Age-related nuclear cataract, bilateral: Secondary | ICD-10-CM | POA: Diagnosis not present

## 2016-02-10 DIAGNOSIS — H4053X3 Glaucoma secondary to other eye disorders, bilateral, severe stage: Secondary | ICD-10-CM | POA: Diagnosis not present

## 2016-02-10 DIAGNOSIS — H0289 Other specified disorders of eyelid: Secondary | ICD-10-CM | POA: Diagnosis not present

## 2016-02-10 DIAGNOSIS — H32 Chorioretinal disorders in diseases classified elsewhere: Secondary | ICD-10-CM | POA: Diagnosis not present

## 2016-02-26 ENCOUNTER — Encounter: Payer: Self-pay | Admitting: Cardiology

## 2016-03-08 ENCOUNTER — Ambulatory Visit (INDEPENDENT_AMBULATORY_CARE_PROVIDER_SITE_OTHER): Payer: Medicare Other | Admitting: *Deleted

## 2016-03-08 DIAGNOSIS — R002 Palpitations: Secondary | ICD-10-CM

## 2016-03-09 NOTE — Progress Notes (Signed)
Carelink Summary Report / Loop Recorder 

## 2016-03-17 DIAGNOSIS — I471 Supraventricular tachycardia: Secondary | ICD-10-CM | POA: Diagnosis not present

## 2016-03-17 DIAGNOSIS — H2512 Age-related nuclear cataract, left eye: Secondary | ICD-10-CM | POA: Diagnosis not present

## 2016-03-17 DIAGNOSIS — Z91013 Allergy to seafood: Secondary | ICD-10-CM | POA: Diagnosis not present

## 2016-03-17 DIAGNOSIS — H25811 Combined forms of age-related cataract, right eye: Secondary | ICD-10-CM | POA: Diagnosis not present

## 2016-03-17 DIAGNOSIS — Z888 Allergy status to other drugs, medicaments and biological substances status: Secondary | ICD-10-CM | POA: Diagnosis not present

## 2016-03-17 DIAGNOSIS — R5382 Chronic fatigue, unspecified: Secondary | ICD-10-CM | POA: Diagnosis not present

## 2016-03-17 DIAGNOSIS — I10 Essential (primary) hypertension: Secondary | ICD-10-CM | POA: Diagnosis not present

## 2016-03-17 DIAGNOSIS — G609 Hereditary and idiopathic neuropathy, unspecified: Secondary | ICD-10-CM | POA: Diagnosis not present

## 2016-03-17 DIAGNOSIS — I429 Cardiomyopathy, unspecified: Secondary | ICD-10-CM | POA: Diagnosis not present

## 2016-03-17 DIAGNOSIS — M8580 Other specified disorders of bone density and structure, unspecified site: Secondary | ICD-10-CM | POA: Diagnosis not present

## 2016-03-17 DIAGNOSIS — F419 Anxiety disorder, unspecified: Secondary | ICD-10-CM | POA: Diagnosis not present

## 2016-03-17 DIAGNOSIS — Z87891 Personal history of nicotine dependence: Secondary | ICD-10-CM | POA: Diagnosis not present

## 2016-03-17 DIAGNOSIS — Z79899 Other long term (current) drug therapy: Secondary | ICD-10-CM | POA: Diagnosis not present

## 2016-03-17 DIAGNOSIS — B399 Histoplasmosis, unspecified: Secondary | ICD-10-CM | POA: Diagnosis not present

## 2016-03-17 DIAGNOSIS — E785 Hyperlipidemia, unspecified: Secondary | ICD-10-CM | POA: Diagnosis not present

## 2016-03-17 DIAGNOSIS — H4053X3 Glaucoma secondary to other eye disorders, bilateral, severe stage: Secondary | ICD-10-CM | POA: Diagnosis not present

## 2016-03-21 ENCOUNTER — Telehealth: Payer: Self-pay | Admitting: Cardiology

## 2016-03-21 NOTE — Telephone Encounter (Signed)
Kelsey James is calling because Dr. Curt Bears prescribe( Carvedilol 6.25mg  )  her some bp medication and it is not working. Her Bp is still running very high . Please call    Thanks

## 2016-03-21 NOTE — Telephone Encounter (Signed)
Blood pressures are averaging 145/85.  HRs 40-50s. She is not having any symptoms with low HRs. During conversation patient tells me that she recently had cataract surgery and was told not to be on a beta blocker.  Upon informing her that she currently is taking a BB named carvedilol, she was "taken back" that eye surgeon did not catch that. Before getting [place] to advise on BPs and med changes she is going to talk with eye surgeon first and then call us back tomorrow. She understands she will speak w/ a triage nurse who will be able to help and forward it to Dr. Curt Bears for recommendations.

## 2016-03-22 NOTE — Telephone Encounter (Signed)
Patient called back today with BP 161/86 HR 60. Patient stated that carvedilol is not working for her, and her eye doctor wants her to be off beta blockers for her next surgery. Her surgery is towards the end of this month. Patient wants another BP medication that while also be effective in having her BP controled and works with her glaucoma. Will forward to Dr. Curt Bears for advisement.

## 2016-03-22 NOTE — Telephone Encounter (Signed)
Can we switch to diltiazem 120 and 25 HCTZ.  Thanks.

## 2016-03-23 MED ORDER — HYDROCHLOROTHIAZIDE 25 MG PO TABS: 25.0000 mg | ORAL_TABLET | Freq: Every day | ORAL | Status: DC

## 2016-03-23 MED ORDER — DILTIAZEM HCL ER COATED BEADS 120 MG PO CP24: 120.0000 mg | ORAL_CAPSULE | Freq: Every day | ORAL | Status: DC

## 2016-03-23 NOTE — Telephone Encounter (Signed)
Called patient about changes. Patient verbalized understanding.

## 2016-03-25 LAB — CUP PACEART REMOTE DEVICE CHECK: Date Time Interrogation Session: 20170224213718

## 2016-03-25 NOTE — Progress Notes (Signed)
Carelink summary report received. Battery status OK. Normal device function. No new tachy episodes, brady, or pause episodes. No new AF episodes. 63 symptom episodes- available ECGs appear SR with PACs/PVCs/bursts of PAT, noise artifact noted on some ECGs. Monthly summary reports and ROV/PRN

## 2016-03-30 ENCOUNTER — Encounter: Payer: Self-pay | Admitting: Internal Medicine

## 2016-03-30 ENCOUNTER — Ambulatory Visit (INDEPENDENT_AMBULATORY_CARE_PROVIDER_SITE_OTHER): Payer: Medicare Other | Admitting: Internal Medicine

## 2016-03-30 VITALS — BP 128/70 | HR 59 | Ht 64.0 in | Wt 119.2 lb

## 2016-03-30 DIAGNOSIS — I471 Supraventricular tachycardia: Secondary | ICD-10-CM

## 2016-03-30 DIAGNOSIS — R002 Palpitations: Secondary | ICD-10-CM | POA: Diagnosis not present

## 2016-03-30 DIAGNOSIS — I10 Essential (primary) hypertension: Secondary | ICD-10-CM

## 2016-03-30 DIAGNOSIS — F419 Anxiety disorder, unspecified: Secondary | ICD-10-CM | POA: Insufficient documentation

## 2016-03-30 DIAGNOSIS — I4719 Other supraventricular tachycardia: Secondary | ICD-10-CM

## 2016-03-30 MED ORDER — VERAPAMIL HCL ER 120 MG PO CP24: 120.0000 mg | ORAL_CAPSULE | Freq: Every day | ORAL | Status: DC

## 2016-03-30 NOTE — Progress Notes (Signed)
OFFICE NOTE  Chief Complaint:  "erratic BP"  Primary Care Physician: Charolette Forward, PA-C  HPI:  Kelsey James is a pleasant 75 year old female with little past medical history. For years she's had very low blood pressure however recently her blood pressure has spiked up into the upper 180's to over [id number] systolic. She recently presented to the emergency room and any pain with symptoms such as throbbing in her neck and head with uncontrolled blood pressure. Subsequently she was started on low-dose amlodipine and she's had a nice improvement in her blood pressure. She does however report lower extremity swelling in her feet. She also describes what I believe is chest tightness. She actually says that she feels that her chest is "caving in" or "deflating". There is some associated sporadic shortness of breath but it has improved with her blood pressure medicine. She does do some activity and exercise and reports that her symptoms improve with exercise.  Kelsey James had a recent nuclear stress test which demonstrated reversible distal anteroapical and anterolateral reversible ischemia. EF of 69%.  I interpreted as intermediate risk due to the distal location of the ischemia however is abnormal. She is relatively thin and I did not suspect this is due to attenuation artifact.  Ultimately she was referred for cardiac catheterization.  This was performed by myself and demonstrated normal coronary arteries. Subsequently she been having palpitations and I arranged for a monitor. She wore that monitor her for 2 weeks between 03/28/2014 and 04/11/2014. Total monitoring time was 288 hours and 50 minutes. Lowest heart rate was 41 and average heart rate was 59. She was noted to have PACs, short runs of PSVT up to 14 beats were also noted.  This likely explains her palpitations.  Given her bradycardia, I recommended discontinuing her timolol, which is the only medication that I can see  which could have been causing her bradycardia. She now presents today as a walk-in with feelings of her heart thumping. She was playing progressive bridge today and she started having heaviness in her chest. She felt like her heart was racing and she was short of breath, dizzy and like she was going to pass out. An EKG was performed in the office today which showed normal sinus rhythm at 71. Blood pressure was normal.  Kelsey James returns today for follow-up. In the interim she is seeing Dr. Jolyn Nap for evaluation of her palpitations and arrhythmia. She was felt to have 2 separate items. The first being palpitations and an ectopic atrial tachycardia with PACs which he felt were most likely benign given her normal coronary anatomy. The second was concerning more for atrial fibrillation, which relates to the episodes where she feels like her chest is "caving in". I discussed multiple ways to try to evaluate for arrhythmias, but given the infrequency of the events, ultimately he recommended an implantable loop recorder. She did undergo successful loop recorder placement and has had 2 transmissions that I reviewed, neither of which have demonstrated any atrial fibrillation. She continues to have some palpitations. She does related somewhat to caffeine and particular chocolate. She reports eating moose tracks ice cream every evening. I did ask that she may want to decrease some of her chocolate intake. Recently she's had problems with hypokalemia. We will plan to recheck a BMET today.  Kelsey James was seen by Dr. Caryl Comes after there was identification of a paroxysmal  atrial tachycardia on her implanted loop recorder. He recommended she start on flecainide 50 mg twice daily. Since starting this medicine she reports feeling horrible. Subsequently she followed up with Chanetta Marshall, NP, who recommended that she discontinue her flecainide. There is been consideration about alternative antiarrhythmics. At this time she  wishes to stay off of anti-rhythmic medication.  I had the pleasure seeing Kelsey James back today in the office. In the end of December she underwent ablation of an inducible atrial tachycardia by Dr. Curt Bears. This appears to been fairly successful at reducing her burden of atrial tachycardia. Although she still has had some breakthroughs. She does have an implantable loop recorder which will be interrogated this month. She has a follow-up appointment with him next week. Her only other concern is that she is having some labile blood pressures. She says she's gained 45 pounds over the holidays and notes her blood pressures a little higher. At times he goes up to 170 but generally ranges between 120 and 140. I do not necessarily see a need to treat that at this time. I asked her to keep records of her blood pressures over the next several weeks.  03/30/2016   Kelsey James returns today for follow-up. Overall she feels like she is doing well. She had as mentioned undergone ablation of an inducible atrial tachycardia by Dr. Curt Bears. She says she still has very infrequent episodes about 2 or 3 times a month of palpitations.  Blood pressure has again been elevated and in the past she had been on amlodipine. She's had some labile blood pressures as evidenced by a recent ophthalmology visit were blood pressure was 161/90.   However blood pressure today is normal at 128/70. There may be an anxiety component to this. She was placed on carvedilol which she felt very bad on and did not want to take beta blockers. She was then given diltiazem and HCTZ. She feels that the diltiazem is not helpful enough for her blood pressure.  We did discuss some alternatives. In the past she been on amlodipine however that is not likely to give her any benefit with her palpitations. Another option would be an older calcium channel blocker such as verapamil , although more likely to have side effects such as constipation and  swelling.  PMHx:  Past Medical History  Diagnosis Date  . Glaucoma   . Rosacea   . Dry eyes   . Wears glasses   . Osteopenia   . Arthritis     "hands" (11/04/2015)    Past Surgical History  Procedure Laterality Date  . Tonsillectomy and adenoidectomy  1979  . Tubal ligation    . Colonoscopy    . Mass excision Right 09/25/2014    Procedure: RIGHT RING FINGER EXCISION MASS AND DEBRIDEMENT DIP JOINT;  Surgeon: Leanora Cover, MD;  Location: [facility];  Service: Orthopedics;  Laterality: Right;  . Left heart catheterization with coronary angiogram N/A 03/12/2014    Procedure: LEFT HEART CATHETERIZATION WITH CORONARY ANGIOGRAM;  Surgeon: Pixie Casino, MD;  Location: [facility] CATH LAB;  Service: Cardiovascular;  Laterality: N/A;  . Loop recorder implant N/A 09/15/2014    Procedure: LOOP RECORDER IMPLANT;  Surgeon: Deboraha Sprang, MD;  Location: [facility] CATH LAB;  Service: Cardiovascular;  Laterality: N/A;  . Atrial tach ablation  11/04/2015  . Electrophysiologic study N/A 11/04/2015    Procedure: A-Tach Ablation;  Surgeon: Will [name], MD;  Location: [place] CV LAB;  Service: Cardiovascular;  Laterality: N/A;    FAMHx:  Family History  Problem Relation Age of Onset  . Valvular heart disease Mother   . Cancer Maternal Grandmother   . Depression Father     SOCHx:   reports that she quit smoking about 13 years ago. Her smoking use included Cigarettes. She has a 12.5 pack-year smoking history. She has never used smokeless tobacco. She reports that she drinks about 4.2 oz of alcohol per week. She reports that she does not use illicit drugs.  ALLERGIES:  Allergies  Allergen Reactions  . Other Nausea And Vomiting    Mushrooms   . Shellfish Allergy Nausea And Vomiting    ROS: Pertinent items noted in HPI and remainder of comprehensive ROS otherwise negative.  HOME MEDS: Current Outpatient Prescriptions  Medication Sig Dispense Refill  . acetaminophen  (TYLENOL) 500 MG tablet Take 500-1,000 mg by mouth every 6 (six) hours as needed (pain).    Marland Kitchen ALPRAZolam (XANAX) 0.25 MG tablet TAKE 1 TABLET BY MOUTH AT BEDTIME AS NEEDED FOR ANXIETY 30 tablet 1  . Biotin 1000 MCG tablet Take 1,000 mcg by mouth daily.    . Calcium-Magnesium (CALCIUM MAGNESIUM 750) 300-300 MG TABS Take 1 tablet by mouth 2 (two) times daily.     . Cholecalciferol (VITAMIN D-1000 MAX ST) 1000 units tablet Take 2,000 Units by mouth daily.    . Eyelid Cleansers (AVENOVA) 0.01 % SOLN Place 1 application into both eyes daily as needed.    . hydrochlorothiazide (HYDRODIURIL) 25 MG tablet Take 1 tablet (25 mg total) by mouth daily. 30 tablet 11  . hydroxypropyl methylcellulose (ISOPTO TEARS) 2.5 % ophthalmic solution Place 1 drop into both eyes as needed for dry eyes.    Marland Kitchen LECITHIN PO Take 1 capsule by mouth daily.    Marland Kitchen MAGNESIUM PO Take 1 tablet by mouth 2 (two) times daily.     . Omega-3 Fatty Acids (FISH OIL PO) Take 1 capsule by mouth 2 (two) times daily.     Marland Kitchen OVER THE COUNTER MEDICATION Take 1 capsule by mouth 2 (two) times daily. Vegetal Silica    . OVER THE COUNTER MEDICATION Take 1 capsule by mouth 2 (two) times daily. Perfect Eyes supplement    . polyvinyl alcohol (ARTIFICIAL TEARS) 1.4 % ophthalmic solution Place 1 drop into both eyes 4 (four) times daily.    Marland Kitchen ZIOPTAN 0.0015 % SOLN Place 1 drop into both eyes daily.     . verapamil (VERELAN PM) 120 MG 24 hr capsule Take 1 capsule (120 mg total) by mouth at bedtime. 30 capsule 5   No current facility-administered medications for this visit.    LABS/IMAGING: No results found for this or any previous visit (from the past 48 hour(s)). No results found.  VITALS: BP 128/70 mmHg  Pulse 59  Ht 5\' 4"  (1.626 m)  Wt 119 lb 3.2 oz (54.069 kg)  BMI 20.45 kg/m2  EXAM: General appearance: alert and no distress Lungs: clear to auscultation bilaterally Abdomen: soft, non-tender; bowel sounds normal; no masses,  no  organomegaly Extremities: extremities normal, atraumatic, no cyanosis or edema  EKG:  Sinus bradycardia 59  ASSESSMENT: 1. Labile hypertension 2. Palpitations 3. Ectopic atrial tachycardia status post ablation  PLAN: 1.   Kelsey James has had labile blood pressures. We will go ahead and change her diltiazem over to verapamil for better blood pressure control and some heart rate benefit.  She will keep her monitor her blood pressures at home and plan to  see Erasmo Downer in 2 weeks for blood pressure check and back to see me in one month.  Pixie Casino, MD, [facility] Attending Cardiologist [place] 03/30/2016, 10:29 AM

## 2016-03-30 NOTE — Patient Instructions (Addendum)
Your physician has recommended you make the following change in your medication...  1. STOP diltiazem 2. START verapamil SR 120 once daily  Your physician recommends that you schedule a follow-up appointment in: 2 weeks with clinical pharmacist for a blood pressure check -- please bring a list of home BP readings & your BP cuff  Your physician recommends that you schedule a follow-up appointment in: [place] with Dr. Debara Pickett

## 2016-04-04 ENCOUNTER — Telehealth: Payer: Self-pay | Admitting: Internal Medicine

## 2016-04-04 NOTE — Telephone Encounter (Signed)
Returned call to patient about constipation. She is wishing to do another trial on diltiazem as she did not experience constipation on this medication and she believes that her blood pressure was beginning to regulate itself by the time she came to her visit with Dr. Debara Pickett. She also states that she has previously been impacted and had to be cleaned out by dr. And she is wishing to avoid that again. We discussed that if needed we could increase the dose of dilt, but her blood pressure was regulated at her visit so she will restart at her previous dose (dilt 120mg  daily) since she already has plenty of the medication at home. She also states that she liked that dilt did not drop her HR drastically as some of the other medications she has tried.  We also discussed using miralax to help gently regulate bowel movements without causing extreme cramping and discomfort like biscodyl. She stated she understood and would call back if she had any additional questions or her blood pressure became elevated prior to followup appt with Dr. Debara Pickett.

## 2016-04-04 NOTE — Telephone Encounter (Signed)
Patient has been on verapamil since prescribed at appt last week - has been taking ~4-5 days (changed from diltiazem)  Notes nerrible cramps last night & this morning. She took dulcolax for relief. Had sweating, ringing in ears. Husband called 911 this morning due to sweating/ears ringing. EMTs came out, assessed, VSS, attributed symptoms to dulcolax/constipation.  Pt reports remaining constipation at this time. She notes she has very sensitive stomach and feels that if SEs of new med include GI upset, she would be prone to it. Aware I will seek advice for med change recommendation.

## 2016-04-04 NOTE — Telephone Encounter (Signed)
Per pt call:   Pt had constipation and severe stomach cramps after taking Med. Verapamil and  Hydrodiuril last night and would like a call back on what to do about it.  Please give her a call.

## 2016-04-07 ENCOUNTER — Ambulatory Visit (INDEPENDENT_AMBULATORY_CARE_PROVIDER_SITE_OTHER): Payer: Medicare Other | Admitting: *Deleted

## 2016-04-07 DIAGNOSIS — R002 Palpitations: Secondary | ICD-10-CM

## 2016-04-08 NOTE — Progress Notes (Signed)
Carelink Summary Report / Loop Recorder 

## 2016-04-09 LAB — CUP PACEART REMOTE DEVICE CHECK: Date Time Interrogation Session: 20170326220652

## 2016-04-09 NOTE — Progress Notes (Signed)
Carelink summary report received. Battery status OK. Normal device function. No new brady, or pause episodes. No new AF episodes. 28 symptom episodes- 11 with ECGs- available ECGs appear: PACs, PVCs 1 with a couplet, noise artifact, 2 ECGs appears burst SVT/PAT. 1 tachy- no ECG available. Monthly summary reports and ROV/PRN

## 2016-04-11 LAB — CUP PACEART REMOTE DEVICE CHECK: Date Time Interrogation Session: 20170425223732

## 2016-04-11 NOTE — Progress Notes (Signed)
Carelink summary report received. Battery status OK. Normal device function. No new symptom episodes, tachy episodes, brady, or pause episodes. No new AF episodes. Monthly summary reports and ROV/PRN 

## 2016-04-15 ENCOUNTER — Telehealth: Payer: Self-pay | Admitting: Internal Medicine

## 2016-04-15 NOTE — Telephone Encounter (Signed)
Spoke with pt, she is very uncomfortable from the cramping from the constipation from the verapamil. Her bp is running 131-141/75-86 on the verapamil. Due to the constipation she wants to switch back to the amlodipine 5 mg, she has some at home. She has an appt in the hypertension clinic Tuesday next week. Will forward to pharm md about advise on switching back. stratagies to help with her constipation discussed.

## 2016-04-15 NOTE — Telephone Encounter (Signed)
Returned call to patient about cramping and constipation. She is actually taking diltiazem and not the verapamil. She reports she was constipated but now she is cramping. She has started using fiber bars to regulate bowel movements. As we discussed previously dramatic increases in fiber can cause the cramping. Instructed her to cut back slightly on her fiber intake and try the miralax as she has not tried this. This should be more gentle on her system and encourage bowel movements without the severe cramping. Instructed her that it will day a day or two to regulate itself and slow the cramping she has been experiencing. She states she understands and will try the miralax. She will follow up with her BP visit on Tuesday.

## 2016-04-15 NOTE — Telephone Encounter (Signed)
New message   Pt c/o medication issue:  1. Name of Medication: verapamil 2. How are you currently taking this medication (dosage and times per day)? 120mg  1xday  3. Are you having a reaction (difficulty breathing--STAT)? no 4. What is your medication issue? Severe constipations and cramps

## 2016-04-15 NOTE — Telephone Encounter (Signed)
F/u   Pt misses a call from RN. Please call back to discuss

## 2016-04-15 NOTE — Telephone Encounter (Signed)
Left message for pt to call.

## 2016-04-19 ENCOUNTER — Ambulatory Visit (INDEPENDENT_AMBULATORY_CARE_PROVIDER_SITE_OTHER): Payer: Medicare Other | Admitting: Pharmacist Clinician (PhC)/ Clinical Pharmacy Specialist

## 2016-04-19 VITALS — BP 130/86 | HR 64 | Ht 64.0 in | Wt 122.8 lb

## 2016-04-19 DIAGNOSIS — I10 Essential (primary) hypertension: Secondary | ICD-10-CM | POA: Diagnosis not present

## 2016-04-19 MED ORDER — AMLODIPINE BESYLATE 5 MG PO TABS: 5.0000 mg | ORAL_TABLET | Freq: Every day | ORAL | Status: DC

## 2016-04-19 NOTE — Patient Instructions (Signed)
Return for a a follow up appointment with Dr. Debara Pickett at the end of the month  Your blood pressure today is 130/86  (goal is < 150/90)  Check your blood pressure at home daily and keep record of the readings.  Take your BP meds as follows: stop diltiazem and re-start amlodipine 5 mg once daily  Bring all of your meds, your BP cuff and your record of home blood pressures to your next appointment.  Exercise as you're able, try to walk approximately 30 minutes per day.  Keep salt intake to a minimum, especially watch canned and prepared boxed foods.  Eat more fresh fruits and vegetables and fewer canned items.  Avoid eating in fast food restaurants.    HOW TO TAKE YOUR BLOOD PRESSURE: . Rest 5 minutes before taking your blood pressure. .  Don't smoke or drink caffeinated beverages for at least 30 minutes before. . Take your blood pressure before (not after) you eat. . Sit comfortably with your back supported and both feet on the floor (don't cross your legs). . Elevate your arm to heart level on a table or a desk. . Use the proper sized cuff. It should fit smoothly and snugly around your bare upper arm. There should be enough room to slip a fingertip under the cuff. The bottom edge of the cuff should be 1 inch above the crease of the elbow. . Ideally, take 3 measurements at one sitting and record the average.

## 2016-04-19 NOTE — Progress Notes (Signed)
04/19/2016 Kelsey James [birth year] [id number]   HPI:  Kelsey James is a 75 y.o. female patient of Dr Debara Pickett, with a PMH below who presents today for hypertension clinic evaluation.  She has had ongoing problems with atrial tachycardia, and unfortunately has had problems tolerating beta blockers and calcium channel blockers.  She had previously been on amlodipine 5mg , but this was switched to verapamil 120 mg in hopes of stopping the episodes.  This unfortunately caused severe constipation and was switched to diltiazem.  The problem continues and she wished to get off the diltiazem as well.  She had previously also tried carvedilol 6/25 mg bid, and can't recall why she didn't like that one.    Blood Pressure Goal:   150/90    Current Medications: diltiazem 120 mg qd; hctz 25 mg qd  Cardiac Hx: atrial tachycardia, hypertension  Social Hx: no tobacco, drinks 1 glass of wine per week; 3-4 cups of decaf coffee per day  Diet: eats some at home, some out; tries to eat chicken mostly at home, salads, sweet potatoes.  Admits to chocolate ice cream regularly  Exercise: none  Home BP readings: range 121-142/73-86  Intolerances: diltiazem (constipation), verapamil (constipation)  Wt Readings from Last 3 Encounters:  04/19/16 122 lb 12.8 oz (55.702 kg)  03/30/16 119 lb 3.2 oz (54.069 kg)  02/02/16 125 lb 3.2 oz (56.79 kg)   BP Readings from Last 3 Encounters:  04/19/16 130/86  03/30/16 128/70  02/02/16 138/82   Pulse Readings from Last 3 Encounters:  04/19/16 64  03/30/16 59  02/02/16 62    Current Outpatient Prescriptions  Medication Sig Dispense Refill  . acetaminophen (TYLENOL) 500 MG tablet Take 500-1,000 mg by mouth every 6 (six) hours as needed (pain).    Marland Kitchen ALPRAZolam (XANAX) 0.25 MG tablet TAKE 1 TABLET BY MOUTH AT BEDTIME AS NEEDED FOR ANXIETY 30 tablet 1  . amLODipine (NORVASC) 5 MG tablet Take 1 tablet (5 mg total) by mouth daily. 30 tablet 1  . Biotin 1000 MCG  tablet Take 1,000 mcg by mouth daily.    . Calcium-Magnesium (CALCIUM MAGNESIUM 750) 300-300 MG TABS Take 1 tablet by mouth 2 (two) times daily.     . Cholecalciferol (VITAMIN D-1000 MAX ST) 1000 units tablet Take 2,000 Units by mouth daily.    . Eyelid Cleansers (AVENOVA) 0.01 % SOLN Place 1 application into both eyes daily as needed.    . hydrochlorothiazide (HYDRODIURIL) 25 MG tablet Take 1 tablet (25 mg total) by mouth daily. 30 tablet 11  . hydroxypropyl methylcellulose (ISOPTO TEARS) 2.5 % ophthalmic solution Place 1 drop into both eyes as needed for dry eyes.    Marland Kitchen LECITHIN PO Take 1 capsule by mouth daily.    Marland Kitchen MAGNESIUM PO Take 1 tablet by mouth 2 (two) times daily.     . Omega-3 Fatty Acids (FISH OIL PO) Take 1 capsule by mouth 2 (two) times daily.     Marland Kitchen OVER THE COUNTER MEDICATION Take 1 capsule by mouth 2 (two) times daily. Vegetal Silica    . OVER THE COUNTER MEDICATION Take 1 capsule by mouth 2 (two) times daily. Perfect Eyes supplement    . polyvinyl alcohol (ARTIFICIAL TEARS) 1.4 % ophthalmic solution Place 1 drop into both eyes 4 (four) times daily.    Marland Kitchen XIIDRA 5 % SOLN APPLY 1 DROP TO EYE 2 TIMES DAILY FOR 30 DAYS. PT WILL COME WITH BENEFIT CARD FROM [facility]  11  .  ZIOPTAN 0.0015 % SOLN Place 1 drop into both eyes daily.      No current facility-administered medications for this visit.    Allergies  Allergen Reactions  . Other Nausea And Vomiting    Mushrooms   . Shellfish Allergy Nausea And Vomiting    Past Medical History  Diagnosis Date  . Glaucoma   . Rosacea   . Dry eyes   . Wears glasses   . Osteopenia   . Arthritis     "hands" (11/04/2015)    Blood pressure 130/86, pulse 64, height 5\' 4"  (1.626 m), weight 122 lb 12.8 oz (55.702 kg).    Tommy Medal PharmD CPP [facility]

## 2016-04-20 NOTE — Assessment & Plan Note (Signed)
Patient with hypertension and atrial tachycardia, was tried on both verapamil and diltiazem, but developed severe constipation.  Has also been tried on carvedilol 6.25 mg but didn't like side effect (although now cannot recall what that was).  Still has occasional episodes she describes as "thumping" that last < 1 minute.  For now I am going to switch her back to her amlodipine 5 mg that she took previously.  She will continue to monitor her home BP, as well as keep track of the "thumping", to see if it occurs with any more frequency when not on rate control.  She is to see Dr. Debara Pickett at the end of the month, so if the problems persist she can address it with him at that time.

## 2016-05-04 ENCOUNTER — Encounter: Payer: Self-pay | Admitting: Pharmacist Clinician (PhC)/ Clinical Pharmacy Specialist

## 2016-05-09 ENCOUNTER — Ambulatory Visit (INDEPENDENT_AMBULATORY_CARE_PROVIDER_SITE_OTHER): Payer: Medicare Other | Admitting: *Deleted

## 2016-05-09 DIAGNOSIS — R002 Palpitations: Secondary | ICD-10-CM

## 2016-05-09 NOTE — Progress Notes (Signed)
Carelink Summary Report / Loop Recorder 

## 2016-05-12 ENCOUNTER — Encounter: Payer: Self-pay | Admitting: Internal Medicine

## 2016-05-12 ENCOUNTER — Ambulatory Visit (INDEPENDENT_AMBULATORY_CARE_PROVIDER_SITE_OTHER): Payer: Medicare Other | Admitting: Internal Medicine

## 2016-05-12 VITALS — BP 112/70 | HR 59 | Ht 64.0 in | Wt 122.2 lb

## 2016-05-12 DIAGNOSIS — R002 Palpitations: Secondary | ICD-10-CM | POA: Diagnosis not present

## 2016-05-12 DIAGNOSIS — I4719 Other supraventricular tachycardia: Secondary | ICD-10-CM

## 2016-05-12 DIAGNOSIS — I471 Supraventricular tachycardia: Secondary | ICD-10-CM

## 2016-05-12 DIAGNOSIS — I10 Essential (primary) hypertension: Secondary | ICD-10-CM | POA: Diagnosis not present

## 2016-05-12 NOTE — Progress Notes (Signed)
OFFICE NOTE  Chief Complaint:  Follow-up BP  Primary Care Physician: Charolette Forward, PA-C  HPI:  KRISELDA SPILLMAN is a pleasant 75 year old female with little past medical history. For years she's had very low blood pressure however recently her blood pressure has spiked up into the upper 180's to over [id number] systolic. She recently presented to the emergency room and any pain with symptoms such as throbbing in her neck and head with uncontrolled blood pressure. Subsequently she was started on low-dose amlodipine and she's had a nice improvement in her blood pressure. She does however report lower extremity swelling in her feet. She also describes what I believe is chest tightness. She actually says that she feels that her chest is "caving in" or "deflating". There is some associated sporadic shortness of breath but it has improved with her blood pressure medicine. She does do some activity and exercise and reports that her symptoms improve with exercise.  Mrs. Lascala had a recent nuclear stress test which demonstrated reversible distal anteroapical and anterolateral reversible ischemia. EF of 69%.  I interpreted as intermediate risk due to the distal location of the ischemia however is abnormal. She is relatively thin and I did not suspect this is due to attenuation artifact.  Ultimately she was referred for cardiac catheterization.  This was performed by myself and demonstrated normal coronary arteries. Subsequently she been having palpitations and I arranged for a monitor. She wore that monitor her for 2 weeks between 03/28/2014 and 04/11/2014. Total monitoring time was 288 hours and 50 minutes. Lowest heart rate was 41 and average heart rate was 59. She was noted to have PACs, short runs of PSVT up to 14 beats were also noted.  This likely explains her palpitations.  Given her bradycardia, I recommended discontinuing her timolol, which is the only medication that I can see  which could have been causing her bradycardia. She now presents today as a walk-in with feelings of her heart thumping. She was playing progressive bridge today and she started having heaviness in her chest. She felt like her heart was racing and she was short of breath, dizzy and like she was going to pass out. An EKG was performed in the office today which showed normal sinus rhythm at 71. Blood pressure was normal.  Mrs. Brevig returns today for follow-up. In the interim she is seeing Dr. Jolyn Nap for evaluation of her palpitations and arrhythmia. She was felt to have 2 separate items. The first being palpitations and an ectopic atrial tachycardia with PACs which he felt were most likely benign given her normal coronary anatomy. The second was concerning more for atrial fibrillation, which relates to the episodes where she feels like her chest is "caving in". I discussed multiple ways to try to evaluate for arrhythmias, but given the infrequency of the events, ultimately he recommended an implantable loop recorder. She did undergo successful loop recorder placement and has had 2 transmissions that I reviewed, neither of which have demonstrated any atrial fibrillation. She continues to have some palpitations. She does related somewhat to caffeine and particular chocolate. She reports eating moose tracks ice cream every evening. I did ask that she may want to decrease some of her chocolate intake. Recently she's had problems with hypokalemia. We will plan to recheck a BMET today.  Mrs. Koestler was seen by Dr. Caryl Comes after there was identification of a paroxysmal  atrial tachycardia on her implanted loop recorder. He recommended she start on flecainide 50 mg twice daily. Since starting this medicine she reports feeling horrible. Subsequently she followed up with Chanetta Marshall, NP, who recommended that she discontinue her flecainide. There is been consideration about alternative antiarrhythmics. At this time she  wishes to stay off of anti-rhythmic medication.  I had the pleasure seeing Mrs. Shannahan back today in the office. In the end of December she underwent ablation of an inducible atrial tachycardia by Dr. Curt Bears. This appears to been fairly successful at reducing her burden of atrial tachycardia. Although she still has had some breakthroughs. She does have an implantable loop recorder which will be interrogated this month. She has a follow-up appointment with him next week. Her only other concern is that she is having some labile blood pressures. She says she's gained 45 pounds over the holidays and notes her blood pressures a little higher. At times he goes up to 170 but generally ranges between 120 and 140. I do not necessarily see a need to treat that at this time. I asked her to keep records of her blood pressures over the next several weeks.  03/30/2016   Mrs. Craddick returns today for follow-up. Overall she feels like she is doing well. She had as mentioned undergone ablation of an inducible atrial tachycardia by Dr. Curt Bears. She says she still has very infrequent episodes about 2 or 3 times a month of palpitations.  Blood pressure has again been elevated and in the past she had been on amlodipine. She's had some labile blood pressures as evidenced by a recent ophthalmology visit were blood pressure was 161/90.   However blood pressure today is normal at 128/70. There may be an anxiety component to this. She was placed on carvedilol which she felt very bad on and did not want to take beta blockers. She was then given diltiazem and HCTZ. She feels that the diltiazem is not helpful enough for her blood pressure.  We did discuss some alternatives. In the past she been on amlodipine however that is not likely to give her any benefit with her palpitations. Another option would be an older calcium channel blocker such as verapamil , although more likely to have side effects such as constipation and  swelling.  05/12/2016  Mrs. Mazzotta returns today for follow-up. In the interim she saw Erasmo Downer our anticoagulation and hypertension pharmacist. Medications adjustments were made and she was taken off of diltiazem and is currently on amlodipine 5 mg daily. Blood pressure today was excellent 112/70. I checked her blood pressure with her home cuff which read 130/80. I then repeated her blood pressure which was 110/70 manual. This suggests her blood pressure cuff may be about 15-20 points higher than it should be. I advised that she may wish to get a new blood pressure cuff. She is interested in getting her loop recorder explant it as she's had no further fast palpitations after atrial tachycardia ablation.  PMHx:  Past Medical History  Diagnosis Date  . Glaucoma   . Rosacea   . Dry eyes   . Wears glasses   . Osteopenia   . Arthritis     "hands" (11/04/2015)    Past Surgical History  Procedure Laterality Date  . Tonsillectomy and adenoidectomy  1979  . Tubal ligation    . Colonoscopy    . Mass excision Right 09/25/2014    Procedure: RIGHT RING FINGER EXCISION MASS AND DEBRIDEMENT DIP JOINT;  Surgeon:  Leanora Cover, MD;  Location: [place];  Service: Orthopedics;  Laterality: Right;  . Left heart catheterization with coronary angiogram N/A 03/12/2014    Procedure: LEFT HEART CATHETERIZATION WITH CORONARY ANGIOGRAM;  Surgeon: Pixie Casino, MD;  Location: [facility] CATH LAB;  Service: Cardiovascular;  Laterality: N/A;  . Loop recorder implant N/A 09/15/2014    Procedure: LOOP RECORDER IMPLANT;  Surgeon: Deboraha Sprang, MD;  Location: [facility] CATH LAB;  Service: Cardiovascular;  Laterality: N/A;  . Atrial tach ablation  11/04/2015  . Electrophysiologic study N/A 11/04/2015    Procedure: A-Tach Ablation;  Surgeon: Will [name], MD;  Location: [place] CV LAB;  Service: Cardiovascular;  Laterality: N/A;    FAMHx:  Family History  Problem Relation Age of Onset  . Valvular  heart disease Mother   . Cancer Maternal Grandmother   . Depression Father     SOCHx:   reports that she quit smoking about 13 years ago. Her smoking use included Cigarettes. She has a 12.5 pack-year smoking history. She has never used smokeless tobacco. She reports that she drinks about 4.2 oz of alcohol per week. She reports that she does not use illicit drugs.  ALLERGIES:  Allergies  Allergen Reactions  . Other Nausea And Vomiting    Mushrooms   . Shellfish Allergy Nausea And Vomiting    ROS: Pertinent items noted in HPI and remainder of comprehensive ROS otherwise negative.  HOME MEDS: Current Outpatient Prescriptions  Medication Sig Dispense Refill  . acetaminophen (TYLENOL) 500 MG tablet Take 500-1,000 mg by mouth every 6 (six) hours as needed (pain).    Marland Kitchen ALPRAZolam (XANAX) 0.25 MG tablet TAKE 1 TABLET BY MOUTH AT BEDTIME AS NEEDED FOR ANXIETY 30 tablet 1  . amLODipine (NORVASC) 5 MG tablet Take 1 tablet (5 mg total) by mouth daily. 30 tablet 1  . Biotin 1000 MCG tablet Take 1,000 mcg by mouth daily.    . Calcium-Magnesium (CALCIUM MAGNESIUM 750) 300-300 MG TABS Take 1 tablet by mouth 2 (two) times daily.     . Cholecalciferol (VITAMIN D-1000 MAX ST) 1000 units tablet Take 2,000 Units by mouth daily.    . Eyelid Cleansers (AVENOVA) 0.01 % SOLN Place 1 application into both eyes daily as needed.    . hydroxypropyl methylcellulose (ISOPTO TEARS) 2.5 % ophthalmic solution Place 1 drop into both eyes as needed for dry eyes.    Marland Kitchen LECITHIN PO Take 1 capsule by mouth daily.    Marland Kitchen MAGNESIUM PO Take 1 tablet by mouth 2 (two) times daily.     . Omega-3 Fatty Acids (FISH OIL PO) Take 1 capsule by mouth 2 (two) times daily.     Marland Kitchen OVER THE COUNTER MEDICATION Take 1 capsule by mouth 2 (two) times daily. Vegetal Silica    . OVER THE COUNTER MEDICATION Take 1 capsule by mouth 2 (two) times daily. Perfect Eyes supplement    . polyvinyl alcohol (ARTIFICIAL TEARS) 1.4 % ophthalmic solution  Place 1 drop into both eyes 4 (four) times daily.    Marland Kitchen XIIDRA 5 % SOLN APPLY 1 DROP TO EYE 2 TIMES DAILY FOR 30 DAYS. PT WILL COME WITH BENEFIT CARD FROM [facility]  11  . ZIOPTAN 0.0015 % SOLN Place 1 drop into both eyes daily.      No current facility-administered medications for this visit.    LABS/IMAGING: No results found for this or any previous visit (from the past 48 hour(s)). No results found.  VITALS: BP  112/70 mmHg  Pulse 59  Ht 5\' 4"  (1.626 m)  Wt 122 lb 3.2 oz (55.43 kg)  BMI 20.97 kg/m2  EXAM: Deferred  EKG: Deferred  ASSESSMENT: 1. Labile hypertension - improved 2. Palpitations - resolved (s/p loop recorder still implanted) 3. Ectopic atrial tachycardia status post ablation  PLAN: 1.   Mrs. Chairs has had labile blood pressures, which seem to have improved after switching her diltiazem over to amlodipine. Her blood pressure cuff is about 15-20 mmHg higher than it should be and I advised her to get a new blood pressure cuff. Her home readings all tend to be in the 130s and 140s although readings in the office here are much better. She reports no further breakthrough palpitations and is interested in having her loop recorder explanted. I'll refer back to Dr. Curt Bears or Dr. Caryl Comes for this.  Follow-up with me in 6 months.  Pixie Casino, MD, [facility] Attending Cardiologist [place] 05/12/2016, 9:26 AM

## 2016-05-12 NOTE — Patient Instructions (Signed)
Your physician wants you to follow-up in: 6 months with Dr. Hilty. You will receive a reminder letter in the mail two months in advance. If you don't receive a letter, please call our office to schedule the follow-up appointment.    

## 2016-05-17 LAB — CUP PACEART REMOTE DEVICE CHECK: MDC IDC SESS DTM: 20170525223645

## 2016-05-17 NOTE — Progress Notes (Signed)
Carelink summary report received. Battery status OK. Normal device function. No new symptom episodes, tachy episodes, brady, or pause episodes. No new AF episodes. Monthly summary reports and ROV/PRN 

## 2016-05-20 ENCOUNTER — Telehealth: Payer: Self-pay | Admitting: Cardiology

## 2016-05-20 NOTE — Telephone Encounter (Signed)
Spoke w/ pt and requested that she send a manual transmission b/c her home monitor has not updated in at least 14 days.   

## 2016-05-21 DIAGNOSIS — T63481A Toxic effect of venom of other arthropod, accidental (unintentional), initial encounter: Secondary | ICD-10-CM | POA: Diagnosis not present

## 2016-05-27 ENCOUNTER — Other Ambulatory Visit: Payer: Self-pay | Admitting: *Deleted

## 2016-05-27 LAB — CUP PACEART REMOTE DEVICE CHECK: MDC IDC SESS DTM: 20170624230625

## 2016-05-27 MED ORDER — AMLODIPINE BESYLATE 5 MG PO TABS: 5.0000 mg | ORAL_TABLET | Freq: Every day | ORAL | Status: DC

## 2016-06-06 ENCOUNTER — Ambulatory Visit (INDEPENDENT_AMBULATORY_CARE_PROVIDER_SITE_OTHER): Payer: Medicare Other | Admitting: *Deleted

## 2016-06-06 DIAGNOSIS — R002 Palpitations: Secondary | ICD-10-CM

## 2016-06-07 NOTE — Progress Notes (Signed)
Carelink Summary Report / Loop Recorder 

## 2016-06-10 ENCOUNTER — Telehealth: Payer: Self-pay | Admitting: Cardiology

## 2016-06-10 NOTE — Telephone Encounter (Signed)
Spoke w/ pt and requested that she send a manual transmission b/c her home monitor has not updated in at least 14 days.   

## 2016-06-23 LAB — CUP PACEART REMOTE DEVICE CHECK: Date Time Interrogation Session: 20170725000740

## 2016-06-24 DIAGNOSIS — H26491 Other secondary cataract, right eye: Secondary | ICD-10-CM | POA: Diagnosis not present

## 2016-07-06 ENCOUNTER — Ambulatory Visit (INDEPENDENT_AMBULATORY_CARE_PROVIDER_SITE_OTHER): Payer: Medicare Other | Admitting: *Deleted

## 2016-07-06 DIAGNOSIS — R002 Palpitations: Secondary | ICD-10-CM | POA: Diagnosis not present

## 2016-07-06 DIAGNOSIS — H4053X3 Glaucoma secondary to other eye disorders, bilateral, severe stage: Secondary | ICD-10-CM | POA: Diagnosis not present

## 2016-07-07 NOTE — Progress Notes (Signed)
Carelink Summary Report / Loop Recorder 

## 2016-08-03 LAB — CUP PACEART REMOTE DEVICE CHECK: Date Time Interrogation Session: 20170824003740

## 2016-08-03 NOTE — Progress Notes (Signed)
Carelink summary report received. Battery status OK. Normal device function. No new symptom episodes, tachy episodes, brady, or pause episodes. No new AF episodes. Monthly summary reports and ROV/PRN 

## 2016-08-05 ENCOUNTER — Ambulatory Visit (INDEPENDENT_AMBULATORY_CARE_PROVIDER_SITE_OTHER): Payer: Medicare Other | Admitting: *Deleted

## 2016-08-05 DIAGNOSIS — R002 Palpitations: Secondary | ICD-10-CM | POA: Diagnosis not present

## 2016-08-08 NOTE — Progress Notes (Signed)
Carelink Summary Report / Loop Recorder 

## 2016-08-20 ENCOUNTER — Other Ambulatory Visit: Payer: Self-pay | Admitting: Women's Health

## 2016-08-20 DIAGNOSIS — G4700 Insomnia, unspecified: Secondary | ICD-10-CM

## 2016-08-22 NOTE — Telephone Encounter (Signed)
Called into pharmacy

## 2016-08-22 NOTE — Telephone Encounter (Signed)
Ok for refill? 

## 2016-08-28 DIAGNOSIS — H6121 Impacted cerumen, right ear: Secondary | ICD-10-CM | POA: Diagnosis not present

## 2016-09-04 LAB — CUP PACEART REMOTE DEVICE CHECK: Date Time Interrogation Session: 20170923010802

## 2016-09-04 NOTE — Progress Notes (Signed)
Carelink summary report received. Battery status OK. Normal device function. No new symptom episodes, tachy episodes, brady, or pause episodes. No new AF episodes. Monthly summary reports and ROV with WC due in 07/2016.

## 2016-09-05 ENCOUNTER — Ambulatory Visit (INDEPENDENT_AMBULATORY_CARE_PROVIDER_SITE_OTHER): Payer: Medicare Other | Admitting: *Deleted

## 2016-09-05 DIAGNOSIS — R002 Palpitations: Secondary | ICD-10-CM

## 2016-09-05 NOTE — Progress Notes (Signed)
Carelink Summary Report / Loop Recorder 

## 2016-09-06 ENCOUNTER — Encounter: Payer: Self-pay | Admitting: Cardiology

## 2016-09-08 ENCOUNTER — Telehealth: Payer: Self-pay | Admitting: *Deleted

## 2016-09-08 NOTE — Telephone Encounter (Signed)
Contacted patient regarding new PAF on LINQ.  Per Dr. Curt Bears, patient should schedule f/u appointment to discuss.  Patient is agreeable to appointment with Dr. Curt Bears on 09/22/16 at 2:45pm.  Call dropped before I could confirm date/time.  Will call back to confirm.  Able to reach patient at cell number.  Confirmed appointment.  She denies additional questions or concerns at this time.

## 2016-09-15 DIAGNOSIS — H04123 Dry eye syndrome of bilateral lacrimal glands: Secondary | ICD-10-CM | POA: Diagnosis not present

## 2016-09-15 DIAGNOSIS — H32 Chorioretinal disorders in diseases classified elsewhere: Secondary | ICD-10-CM | POA: Diagnosis not present

## 2016-09-15 DIAGNOSIS — Z961 Presence of intraocular lens: Secondary | ICD-10-CM | POA: Diagnosis not present

## 2016-09-15 DIAGNOSIS — H2512 Age-related nuclear cataract, left eye: Secondary | ICD-10-CM | POA: Diagnosis not present

## 2016-09-15 DIAGNOSIS — B399 Histoplasmosis, unspecified: Secondary | ICD-10-CM | POA: Diagnosis not present

## 2016-09-22 ENCOUNTER — Encounter: Payer: Self-pay | Admitting: Cardiology

## 2016-09-22 ENCOUNTER — Ambulatory Visit (INDEPENDENT_AMBULATORY_CARE_PROVIDER_SITE_OTHER): Payer: Medicare Other | Admitting: Cardiology

## 2016-09-22 VITALS — BP 128/80 | HR 56 | Ht 64.0 in | Wt 122.8 lb

## 2016-09-22 DIAGNOSIS — Z79899 Other long term (current) drug therapy: Secondary | ICD-10-CM | POA: Diagnosis not present

## 2016-09-22 DIAGNOSIS — I480 Paroxysmal atrial fibrillation: Secondary | ICD-10-CM

## 2016-09-22 LAB — CUP PACEART INCLINIC DEVICE CHECK
MDC IDC PG IMPLANT DT: 20151102
MDC IDC SESS DTM: 20171109162842

## 2016-09-22 NOTE — Progress Notes (Signed)
Electrophysiology Office Note   Date:  09/22/2016   ID:  Kelsey James, DOB 12-13-1940, MRN [id number]  PCP:  Charolette Forward, PA-C  Cardiologist:  Debara Pickett Primary Electrophysiologist:  Dayn Barich [name], MD    Chief Complaint  Patient presents with  . Pacemaker Check    PAF     History of Present Illness: Kelsey James is a 75 y.o. female who presents today for electrophysiology evaluation.   She had an atrial tachycardia ablation on 12/21.  She was found to have atrial fibrillation on monitor on 08/29/16.  She says that she was having episodes of palpitations at the time. Otherwise she is felt well without major complaint.  Today, she denies symptoms of chest pain, shortness of breath, orthopnea, PND, lower extremity edema, claudication, dizziness, presyncope, syncope, bleeding, or neurologic sequela. The patient is tolerating medications without difficulties and is otherwise without complaint today.    Past Medical History:  Diagnosis Date  . Arthritis    "hands" (11/04/2015)  . Dry eyes   . Glaucoma   . Osteopenia   . Rosacea   . Wears glasses    Past Surgical History:  Procedure Laterality Date  . ATRIAL TACH ABLATION  11/04/2015  . COLONOSCOPY    . ELECTROPHYSIOLOGIC STUDY N/A 11/04/2015   Procedure: A-Tach Ablation;  Surgeon: Warda Mcqueary [name], MD;  Location: [place] CV LAB;  Service: Cardiovascular;  Laterality: N/A;  . LEFT HEART CATHETERIZATION WITH CORONARY ANGIOGRAM N/A 03/12/2014   Procedure: LEFT HEART CATHETERIZATION WITH CORONARY ANGIOGRAM;  Surgeon: Pixie Casino, MD;  Location[facility] CATH LAB;  Service: Cardiovascular;  Laterality: N/A;  . LOOP RECORDER IMPLANT N/A 09/15/2014   Procedure: LOOP RECORDER IMPLANT;  Surgeon: Deboraha Sprang, MD;  Location: [facility] CATH LAB;  Service: Cardiovascular;  Laterality: N/A;  . MASS EXCISION Right 09/25/2014   Procedure: RIGHT RING FINGER EXCISION MASS AND DEBRIDEMENT DIP JOINT;  Surgeon: Leanora Cover, MD;   Location: [place];  Service: Orthopedics;  Laterality: Right;  . TONSILLECTOMY AND ADENOIDECTOMY  1979  . TUBAL LIGATION       Current Outpatient Prescriptions  Medication Sig Dispense Refill  . acetaminophen (TYLENOL) 500 MG tablet Take 500-1,000 mg by mouth every 6 (six) hours as needed (pain).    Marland Kitchen amLODipine (NORVASC) 5 MG tablet Take 1 tablet (5 mg total) by mouth daily. 90 tablet 3  . aspirin 325 MG tablet Take 325 mg by mouth as needed for headache.     . Biotin 1000 MCG tablet Take 1,000 mcg by mouth daily.    . Calcium-Magnesium (CALCIUM MAGNESIUM 750) 300-300 MG TABS Take 1 tablet by mouth 2 (two) times daily.     . Cholecalciferol (VITAMIN D-1000 MAX ST) 1000 units tablet Take 2,000 Units by mouth daily.    . Eyelid Cleansers (AVENOVA) 0.01 % SOLN Place 1 application into both eyes daily as needed.    . hydroxypropyl methylcellulose (ISOPTO TEARS) 2.5 % ophthalmic solution Place 1 drop into both eyes as needed for dry eyes.    Marland Kitchen LECITHIN PO Take 1 capsule by mouth daily.    Marland Kitchen MAGNESIUM PO Take 1 tablet by mouth 2 (two) times daily.     . Omega-3 Fatty Acids (FISH OIL PO) Take 1 capsule by mouth 2 (two) times daily.     Marland Kitchen OVER THE COUNTER MEDICATION Take 1 capsule by mouth 2 (two) times daily. Vegetal Silica    . OVER THE COUNTER MEDICATION Take  1 capsule by mouth 2 (two) times daily. Perfect Eyes supplement    . polyvinyl alcohol (ARTIFICIAL TEARS) 1.4 % ophthalmic solution Place 1 drop into both eyes 4 (four) times daily.    Marland Kitchen XANAX 0.25 MG tablet TAKE 1 TABLET BY MOUTH AT BEDTIME AS NEEDED FOR ANXIETY 30 tablet 0  . XIIDRA 5 % SOLN APPLY 1 DROP TO EYE 2 TIMES DAILY FOR 30 DAYS. PT Tkai Serfass COME WITH BENEFIT CARD FROM [facility]  11  . ZIOPTAN 0.0015 % SOLN Place 1 drop into both eyes daily.      No current facility-administered medications for this visit.     Allergies:   Other and Shellfish allergy   Social History:  The patient  reports that she quit smoking  about 13 years ago. Her smoking use included Cigarettes. She has a 12.50 pack-year smoking history. She has never used smokeless tobacco. She reports that she drinks about 4.2 oz of alcohol per week . She reports that she does not use drugs.   Family History:  The patient's family history includes Cancer in her maternal grandmother; Depression in her father; Valvular heart disease in her mother.    ROS:  Please see the history of present illness.   Otherwise, review of systems is positive for palpitations.   All other systems are reviewed and negative.    PHYSICAL EXAM: VS:  BP 128/80   Pulse (!) 56   Ht 5\' 4"  (1.626 m)   Wt 122 lb 12.8 oz (55.7 kg)   BMI 21.08 kg/m  , BMI Body mass index is 21.08 kg/m. GEN: Well nourished, well developed, in no acute distress  HEENT: normal  Neck: no JVD, carotid bruits, or masses Cardiac: RRR; no murmurs, rubs, or gallops,no edema  Respiratory:  clear to auscultation bilaterally, normal work of breathing GI: soft, nontender, nondistended, + BS MS: no deformity or atrophy  Skin: warm and dry Neuro:  Strength and sensation are intact Psych: euthymic mood, full affect  EKG:  EKG is not ordered today. Personal review of the EKG ordered 03/30/16 shows sinus rhythm, rate 59  Device interrogation reviewed and can be evaluated in [facility]  Recent Labs: 10/27/2015: BUN 10; Creat 0.51; Hemoglobin 13.5; Platelets 255; Potassium 4.1; Sodium 138    Lipid Panel  No results found for: CHOL, TRIG, HDL, CHOLHDL, VLDL, LDLCALC, LDLDIRECT   Wt Readings from Last 3 Encounters:  09/22/16 122 lb 12.8 oz (55.7 kg)  05/12/16 122 lb 3.2 oz (55.4 kg)  04/19/16 122 lb 12.8 oz (55.7 kg)    ASSESSMENT AND PLAN:  1.  Atrial tachycardia: An ablation of her tachycardia which was at 1:00 on the tricuspid valve.    2. Hypertension:   3.  atrial fibrillation: Found on her event monitor. She has an elevated stroke risk and we'll therefore start her on rivaroxaban. She  is minimally symptomatic and therefore Kemon Devincenzi not start any rhythm control medications. We'll follow back up in 6 months to further evaluate.   This patients CHA2DS2-VASc Score and unadjusted Ischemic Stroke Rate (% per year) is equal to 3.2 % stroke rate/year from a score of 3  Above score calculated as 1 point each if present [CHF, HTN, DM, Vascular=MI/PAD/Aortic Plaque, Age if 65-74, or Female] Above score calculated as 2 points each if present [Age > 75, or Stroke/TIA/TE]    Current medicines are reviewed at length with the patient today.   The patient does not have concerns regarding her medicines.  The  following changes were made today: coreg 6.25 mg BID  Labs/ tests ordered today include:   No orders of the defined types were placed in this encounter.    Disposition:   FU with Leonilda Cozby 6  months  Signed, Mashawn Brazil [name], MD  09/22/2016 3:14 PM     [place] [address] [place] [address] [phone] (office) [phone] (fax)

## 2016-09-22 NOTE — Patient Instructions (Signed)
Medication Instructions:   Your physician has recommended you make the following change in your medication:   1) START Xarelto  (we will call you with dosage)  --- If you need a refill on your cardiac medications before your next appointment, please call your pharmacy. ---  Labwork:  BMET today  (we will call you with results and instruct you of which Xarelto dosage to begin)  Testing/Procedures:  None ordered  Follow-Up:  Your physician wants you to follow-up in: 6 with Dr. Curt Bears.  You will receive a reminder letter in the mail two months in advance. If you don't receive a letter, please call our office to schedule the follow-up appointment.  Thank you for choosing [facility]!!   Kelsey Curet, RN [phone]   Any Other Special Instructions Will Be Listed Below (If Applicable).  Rivaroxaban oral tablets What is this medicine? RIVAROXABAN (ri va ROX a ban) is an anticoagulant (blood thinner). It is used to treat blood clots in the lungs or in the veins. It is also used after knee or hip surgeries to prevent blood clots. It is also used to lower the chance of stroke in people with a medical condition called atrial fibrillation. This medicine may be used for other purposes; ask your health care provider or pharmacist if you have questions. What should I tell my health care provider before I take this medicine? They need to know if you have any of these conditions: -bleeding disorders -bleeding in the brain -blood in your stools (black or tarry stools) or if you have blood in your vomit -history of stomach bleeding -kidney disease -liver disease -low blood counts, like low white cell, platelet, or red cell counts -recent or planned spinal or epidural procedure -take medicines that treat or prevent blood clots -an unusual or allergic reaction to rivaroxaban, other medicines, foods, dyes, or preservatives -pregnant or trying to get pregnant -breast-feeding How should  I use this medicine? Take this medicine by mouth with a glass of water. Follow the directions on the prescription label. Take your medicine at regular intervals. Do not take it more often than directed. Do not stop taking except on your doctor's advice. Stopping this medicine may increase your risk of a blood clot. Be sure to refill your prescription before you run out of medicine. If you are taking this medicine after hip or knee replacement surgery, take it with or without food. If you are taking this medicine for atrial fibrillation, take it with your evening meal. If you are taking this medicine to treat blood clots, take it with food at the same time each day. If you are unable to swallow your tablet, you may crush the tablet and mix it in applesauce. Then, immediately eat the applesauce. You should eat more food right after you eat the applesauce containing the crushed tablet. Talk to your pediatrician regarding the use of this medicine in children. Special care may be needed. Overdosage: If you think you have taken too much of this medicine contact a poison control center or emergency room at once. NOTE: This medicine is only for you. Do not share this medicine with others. What if I miss a dose? If you take your medicine once a day and miss a dose, take the missed dose as soon as you remember. If you take your medicine twice a day and miss a dose, take the missed dose immediately. In this instance, 2 tablets may be taken at the same time. The next  day you should take 1 tablet twice a day as directed. What may interact with this medicine? -aspirin and aspirin-like medicines -certain antibiotics like erythromycin, azithromycin, and clarithromycin -certain medicines for fungal infections like ketoconazole and itraconazole -certain medicines for irregular heart beat like amiodarone, quinidine, dronedarone -certain medicines for seizures like carbamazepine, phenytoin -certain medicines that treat or  prevent blood clots like warfarin, enoxaparin, and dalteparin -conivaptan -diltiazem -felodipine -indinavir -lopinavir; ritonavir -NSAIDS, medicines for pain and inflammation, like ibuprofen or naproxen -ranolazine -rifampin -ritonavir -St. John's wort -verapamil This list may not describe all possible interactions. Give your health care provider a list of all the medicines, herbs, non-prescription drugs, or dietary supplements you use. Also tell them if you smoke, drink alcohol, or use illegal drugs. Some items may interact with your medicine. What should I watch for while using this medicine? Visit your doctor or health care professional for regular checks on your progress. Your condition will be monitored carefully while you are receiving this medicine. Notify your doctor or health care professional and seek emergency treatment if you develop breathing problems; changes in vision; chest pain; severe, sudden headache; pain, swelling, warmth in the leg; trouble speaking; sudden numbness or weakness of the face, arm, or leg. These can be signs that your condition has gotten worse. If you are going to have surgery, tell your doctor or health care professional that you are taking this medicine. Tell your health care professional that you use this medicine before you have a spinal or epidural procedure. Sometimes people who take this medicine have bleeding problems around the spine when they have a spinal or epidural procedure. This bleeding is very rare. If you have a spinal or epidural procedure while on this medicine, call your health care professional immediately if you have back pain, numbness or tingling (especially in your legs and feet), muscle weakness, paralysis, or loss of bladder or bowel control. Avoid sports and activities that might cause injury while you are using this medicine. Severe falls or injuries can cause unseen bleeding. Be careful when using sharp tools or knives. Consider  using an Copy. Take special care brushing or flossing your teeth. Report any injuries, bruising, or red spots on the skin to your doctor or health care professional. What side effects may I notice from receiving this medicine? Side effects that you should report to your doctor or health care professional as soon as possible: -allergic reactions like skin rash, itching or hives, swelling of the face, lips, or tongue -back pain -redness, blistering, peeling or loosening of the skin, including inside the mouth -signs and symptoms of bleeding such as bloody or black, tarry stools; red or dark-brown urine; spitting up blood or brown material that looks like coffee grounds; red spots on the skin; unusual bruising or bleeding from the eye, gums, or nose Side effects that usually do not require medical attention (Report these to your doctor or health care professional if they continue or are bothersome.): -dizziness -muscle pain This list may not describe all possible side effects. Call your doctor for medical advice about side effects. You may report side effects to FDA at 1-800-FDA-1088. Where should I keep my medicine? Keep out of the reach of children. Store at room temperature between 15 and 30 degrees C (59 and 86 degrees F). Throw away any unused medicine after the expiration date. NOTE: This sheet is a summary. It may not cover all possible information. If you have questions about this medicine, talk to  your doctor, pharmacist, or health care provider.    2016, Elsevier/Gold Standard. (2014-10-29 12:45:34)

## 2016-09-23 ENCOUNTER — Telehealth: Payer: Self-pay | Admitting: Cardiology

## 2016-09-23 LAB — BASIC METABOLIC PANEL
BUN: 11 mg/dL (ref 7–25)
CALCIUM: 9 mg/dL (ref 8.6–10.4)
CHLORIDE: 103 mmol/L (ref 98–110)
CO2: 26 mmol/L (ref 20–31)
Creat: 0.57 mg/dL — ABNORMAL LOW (ref 0.60–0.93)
Glucose, Bld: 78 mg/dL (ref 65–99)
Potassium: 3.9 mmol/L (ref 3.5–5.3)
SODIUM: 139 mmol/L (ref 135–146)

## 2016-09-23 NOTE — Telephone Encounter (Signed)
F/u Message ° °Pt returning RN call. Please call back to discuss  °

## 2016-09-23 NOTE — Telephone Encounter (Signed)
Pt calling back about 2 things:  1.  Pt wanted to know her lab results per Dr Curt Bears.  Informed the pt that per Dr Curt Bears, her BMP was without major abnromality.  Pt verbalized understanding.  2.  Pt calling to ask Dr Curt Bears how much Xarelto is he going to advise on?  Pt states that he was going to get back with her to determine the dosage of this medication.  After Dr Curt Bears advises on her Xarelto dose, she would like for his RN to call this in for a month supply only, to her local pharmacy, then kick this over to her 76 day supplier thereafter.   Informed the pt that Dr Curt Bears and RN are out of the office, but I will route this message to them for further review and recommendation and follow-up with the pt thereafter.  Pt verbalized understanding and agrees with this plan.

## 2016-09-27 MED ORDER — RIVAROXABAN 20 MG PO TABS: 20.0000 mg | ORAL_TABLET | Freq: Every day | ORAL | 0 refills | Status: DC

## 2016-09-27 NOTE — Telephone Encounter (Signed)
Start Xarelto 20 mg daily

## 2016-09-27 NOTE — Telephone Encounter (Signed)
Returned call to patient and let her know Dr Curt Bears would like for her to start 20 mg of Xarelto.  She is aware and it has been sent to her pharmacy

## 2016-10-01 LAB — CUP PACEART REMOTE DEVICE CHECK
Implantable Pulse Generator Implant Date: 20151102
MDC IDC SESS DTM: 20171023024223

## 2016-10-01 NOTE — Progress Notes (Signed)
Carelink summary report received. Battery status OK. Normal device function. No new symptom episodes, tachy episodes, brady, or pause episodes. No new AF episodes. Monthly summary reports and ROV/PRN 

## 2016-10-04 ENCOUNTER — Ambulatory Visit (INDEPENDENT_AMBULATORY_CARE_PROVIDER_SITE_OTHER): Payer: Medicare Other | Admitting: *Deleted

## 2016-10-04 DIAGNOSIS — R002 Palpitations: Secondary | ICD-10-CM

## 2016-10-05 DIAGNOSIS — H4053X3 Glaucoma secondary to other eye disorders, bilateral, severe stage: Secondary | ICD-10-CM | POA: Diagnosis not present

## 2016-10-05 NOTE — Progress Notes (Signed)
Carelink Summary Report / Loop Recorder 

## 2016-10-11 ENCOUNTER — Encounter: Payer: Self-pay | Admitting: Cardiology

## 2016-10-11 NOTE — Telephone Encounter (Signed)
Kelsey James is returning your call. Thanks

## 2016-10-11 NOTE — Telephone Encounter (Signed)
This encounter was created in error - please disregard.

## 2016-10-17 DIAGNOSIS — Z888 Allergy status to other drugs, medicaments and biological substances status: Secondary | ICD-10-CM | POA: Diagnosis not present

## 2016-10-17 DIAGNOSIS — Z79899 Other long term (current) drug therapy: Secondary | ICD-10-CM | POA: Diagnosis not present

## 2016-10-17 DIAGNOSIS — Z7901 Long term (current) use of anticoagulants: Secondary | ICD-10-CM | POA: Diagnosis not present

## 2016-10-17 DIAGNOSIS — I10 Essential (primary) hypertension: Secondary | ICD-10-CM | POA: Diagnosis not present

## 2016-10-17 DIAGNOSIS — E784 Other hyperlipidemia: Secondary | ICD-10-CM | POA: Diagnosis not present

## 2016-10-17 DIAGNOSIS — Z961 Presence of intraocular lens: Secondary | ICD-10-CM | POA: Diagnosis not present

## 2016-10-17 DIAGNOSIS — H04123 Dry eye syndrome of bilateral lacrimal glands: Secondary | ICD-10-CM | POA: Diagnosis not present

## 2016-10-17 DIAGNOSIS — H2512 Age-related nuclear cataract, left eye: Secondary | ICD-10-CM | POA: Diagnosis not present

## 2016-10-17 DIAGNOSIS — Z87891 Personal history of nicotine dependence: Secondary | ICD-10-CM | POA: Diagnosis not present

## 2016-10-24 ENCOUNTER — Other Ambulatory Visit: Payer: Self-pay | Admitting: Cardiology

## 2016-10-27 DIAGNOSIS — G609 Hereditary and idiopathic neuropathy, unspecified: Secondary | ICD-10-CM | POA: Diagnosis not present

## 2016-10-27 DIAGNOSIS — Z888 Allergy status to other drugs, medicaments and biological substances status: Secondary | ICD-10-CM | POA: Diagnosis not present

## 2016-10-27 DIAGNOSIS — Z9841 Cataract extraction status, right eye: Secondary | ICD-10-CM | POA: Diagnosis not present

## 2016-10-27 DIAGNOSIS — Z91013 Allergy to seafood: Secondary | ICD-10-CM | POA: Diagnosis not present

## 2016-10-27 DIAGNOSIS — I429 Cardiomyopathy, unspecified: Secondary | ICD-10-CM | POA: Diagnosis not present

## 2016-10-27 DIAGNOSIS — I471 Supraventricular tachycardia: Secondary | ICD-10-CM | POA: Diagnosis not present

## 2016-10-27 DIAGNOSIS — Z961 Presence of intraocular lens: Secondary | ICD-10-CM | POA: Diagnosis not present

## 2016-10-27 DIAGNOSIS — H25812 Combined forms of age-related cataract, left eye: Secondary | ICD-10-CM | POA: Diagnosis not present

## 2016-10-27 DIAGNOSIS — M8580 Other specified disorders of bone density and structure, unspecified site: Secondary | ICD-10-CM | POA: Diagnosis not present

## 2016-10-27 DIAGNOSIS — Z87891 Personal history of nicotine dependence: Secondary | ICD-10-CM | POA: Diagnosis not present

## 2016-10-27 DIAGNOSIS — Z91040 Latex allergy status: Secondary | ICD-10-CM | POA: Diagnosis not present

## 2016-10-27 DIAGNOSIS — I10 Essential (primary) hypertension: Secondary | ICD-10-CM | POA: Diagnosis not present

## 2016-10-27 DIAGNOSIS — Z7902 Long term (current) use of antithrombotics/antiplatelets: Secondary | ICD-10-CM | POA: Diagnosis not present

## 2016-10-27 DIAGNOSIS — Z79899 Other long term (current) drug therapy: Secondary | ICD-10-CM | POA: Diagnosis not present

## 2016-10-27 DIAGNOSIS — I480 Paroxysmal atrial fibrillation: Secondary | ICD-10-CM | POA: Diagnosis not present

## 2016-10-27 DIAGNOSIS — E785 Hyperlipidemia, unspecified: Secondary | ICD-10-CM | POA: Diagnosis not present

## 2016-10-27 DIAGNOSIS — F411 Generalized anxiety disorder: Secondary | ICD-10-CM | POA: Diagnosis not present

## 2016-10-27 DIAGNOSIS — H52202 Unspecified astigmatism, left eye: Secondary | ICD-10-CM | POA: Diagnosis not present

## 2016-11-03 ENCOUNTER — Ambulatory Visit (INDEPENDENT_AMBULATORY_CARE_PROVIDER_SITE_OTHER): Payer: Medicare Other | Admitting: *Deleted

## 2016-11-03 DIAGNOSIS — R002 Palpitations: Secondary | ICD-10-CM

## 2016-11-04 NOTE — Progress Notes (Signed)
Carelink Summary Report / Loop Recorder 

## 2016-11-08 ENCOUNTER — Other Ambulatory Visit: Payer: Self-pay | Admitting: Women's Health

## 2016-11-08 DIAGNOSIS — G4700 Insomnia, unspecified: Secondary | ICD-10-CM

## 2016-11-09 ENCOUNTER — Other Ambulatory Visit: Payer: Self-pay

## 2016-11-09 DIAGNOSIS — G4709 Other insomnia: Secondary | ICD-10-CM

## 2016-11-09 DIAGNOSIS — F5101 Primary insomnia: Secondary | ICD-10-CM

## 2016-11-09 MED ORDER — ALPRAZOLAM 0.25 MG PO TABS: ORAL_TABLET | ORAL | 1 refills | Status: DC

## 2016-11-09 NOTE — Telephone Encounter (Signed)
Called in to pharmacy and patient informed. 

## 2016-11-09 NOTE — Telephone Encounter (Signed)
Patient called because she requested refill on Xanax and it was denied stating she needs to schedule appt. Her CE is scheduled for 02/01/17 and she is not due until 01/2017.  Not sure why denied.  She said she had it filled Oct 17 and still has enough for a few weeks but went ahead and put refill request in to go ahead and have on hand.  Ok to refill it?

## 2016-11-09 NOTE — Telephone Encounter (Signed)
Ok for refill? 

## 2016-11-17 LAB — CUP PACEART REMOTE DEVICE CHECK
Date Time Interrogation Session: 20171122034051
Implantable Pulse Generator Implant Date: 20151102

## 2016-11-17 NOTE — Progress Notes (Signed)
Carelink summary report received. Battery status OK. Normal device function. No new symptom episodes, brady, or pause episodes.  2 AF 0%- 1 w/ ECG appears AF. 1 tachy- ECG appears AF w/ RVR. +Xarelto.  Monthly summary reports and ROV/PRN

## 2016-11-21 DIAGNOSIS — H4053X3 Glaucoma secondary to other eye disorders, bilateral, severe stage: Secondary | ICD-10-CM | POA: Diagnosis not present

## 2016-12-05 ENCOUNTER — Ambulatory Visit (INDEPENDENT_AMBULATORY_CARE_PROVIDER_SITE_OTHER): Payer: Medicare Other | Admitting: *Deleted

## 2016-12-05 DIAGNOSIS — R002 Palpitations: Secondary | ICD-10-CM | POA: Diagnosis not present

## 2016-12-05 NOTE — Progress Notes (Signed)
Carelink Summary Report / Loop Recorder 

## 2016-12-22 LAB — CUP PACEART REMOTE DEVICE CHECK
Date Time Interrogation Session: 20171222043957
MDC IDC PG IMPLANT DT: 20151102

## 2017-01-02 ENCOUNTER — Ambulatory Visit (INDEPENDENT_AMBULATORY_CARE_PROVIDER_SITE_OTHER): Payer: Medicare Other | Admitting: *Deleted

## 2017-01-02 DIAGNOSIS — R002 Palpitations: Secondary | ICD-10-CM | POA: Diagnosis not present

## 2017-01-03 NOTE — Progress Notes (Signed)
Carelink Summary Report / Loop Recorder 

## 2017-01-07 LAB — CUP PACEART REMOTE DEVICE CHECK
MDC IDC PG IMPLANT DT: 20151102
MDC IDC SESS DTM: 20180121043841

## 2017-01-17 DIAGNOSIS — Z1231 Encounter for screening mammogram for malignant neoplasm of breast: Secondary | ICD-10-CM | POA: Diagnosis not present

## 2017-01-18 ENCOUNTER — Ambulatory Visit: Payer: Medicare Other | Attending: Ophthalmology | Admitting: Occupational Therapy

## 2017-01-18 DIAGNOSIS — R41842 Visuospatial deficit: Secondary | ICD-10-CM | POA: Diagnosis not present

## 2017-01-19 ENCOUNTER — Telehealth: Payer: Self-pay | Admitting: Internal Medicine

## 2017-01-19 DIAGNOSIS — N6489 Other specified disorders of breast: Secondary | ICD-10-CM | POA: Diagnosis not present

## 2017-01-19 DIAGNOSIS — N6321 Unspecified lump in the left breast, upper outer quadrant: Secondary | ICD-10-CM | POA: Diagnosis not present

## 2017-01-19 NOTE — Telephone Encounter (Signed)
Has upcoming visit on 3/27 w Dr. Debara Pickett. Was seen by Dr. Curt Bears in November and put on Xarelto for PAF  Clearance sought for procedure including hold of Xarelto.

## 2017-01-19 NOTE — Telephone Encounter (Signed)
Request for surgical clearance:  1. What type of surgery is being performed?  mammogerphy   2. When is this surgery scheduled? 01-25-17  3. Are there any medications that need to be held prior to surgery and how long? xarelto  4. Name of physician performing surgery? Dr. Belva Agee  5. What is your office phone and fax number? [phone]   Fax  [phone]

## 2017-01-19 NOTE — Telephone Encounter (Signed)
Can discuss holding at follow-up. If her procedure is before I see her, then okay to hold Xarelto for 2 days prior and restart 24 hours after.  Dr Lemmie Evens

## 2017-01-19 NOTE — Therapy (Signed)
[facility] [address] [place], [place], [address] Phone: [phone]   Fax:  [phone]  Occupational Therapy Evaluation  Patient Details  Name: Kelsey James MRN: [id number] Date of Birth: [birth year] Referring Provider: Dr. Darlys Gales  Encounter Date: 01/18/2017      OT End of Session - 01/18/17 1529    Visit Number 1   Number of Visits 1   Authorization Type MCR   Authorization - Visit Number 1   Authorization - Number of Visits 10   OT Start Time 4034   OT Stop Time 1525   OT Time Calculation (min) 50 min   Activity Tolerance Patient tolerated treatment well   Behavior During Therapy [facility] for tasks assessed/performed      Past Medical History:  Diagnosis Date  . Arthritis    "hands" (11/04/2015)  . Dry eyes   . Glaucoma   . Osteopenia   . Rosacea   . Wears glasses     Past Surgical History:  Procedure Laterality Date  . ATRIAL TACH ABLATION  11/04/2015  . COLONOSCOPY    . ELECTROPHYSIOLOGIC STUDY N/A 11/04/2015   Procedure: A-Tach Ablation;  Surgeon: Will [name], MD;  Location: [place] [facility];  Service: Cardiovascular;  Laterality: N/A;  . LEFT HEART CATHETERIZATION WITH CORONARY ANGIOGRAM N/A 03/12/2014   Procedure: LEFT HEART CATHETERIZATION WITH CORONARY ANGIOGRAM;  Surgeon: Pixie Casino, MD;  Location: [facility] CATH LAB;  Service: Cardiovascular;  Laterality: N/A;  . LOOP RECORDER IMPLANT N/A 09/15/2014   Procedure: LOOP RECORDER IMPLANT;  Surgeon: Deboraha Sprang, MD;  Location: [facility] C[facility] LAB;  Service: Cardiovascular;  Laterality: N/A;  . MASS EXCISION Right 09/25/2014   Procedure: RIGHT RING FINGER EXCISION MASS AND DEBRIDEMENT DIP JOINT;  Surgeon: Leanora Cover, MD;  Location: [place];  Service: Orthopedics;  Laterality: Right;  . TONSILLECTOMY AND ADENOIDECTOMY  1979  . TUBAL LIGATION      There were no vitals filed for this visit.      Subjective Assessment -  01/18/17 1438    Subjective  Pt s/p cataract removal bilateral eyes with lens implant, steroid induced glaucoma   Pertinent History see epic   Patient Stated Goals magnifiers for reading   Currently in Pain? Yes   Pain Score 8    Pain Location Eye   Pain Orientation Right;Left   Pain Descriptors / Indicators Other (Comment)  irritated   Pain Type Chronic pain   Pain Onset More than a month ago   Aggravating Factors  dry eyes   Pain Relieving Factors eye drops           [facility] OT Assessment - 01/19/17 0001      Assessment   Diagnosis pseudophakia both eyes   Referring Provider Dr. Darlys Gales   Onset Date 12/14/16     Precautions   Precautions None     Balance Screen   Has the patient fallen in the past 6 months No   Has the patient had a decrease in activity level because of a fear of falling?  No   Is the patient reluctant to leave their home because of a fear of falling?  No     Home  Environment   Family/patient expects to be discharged to: Private residence   Living Arrangements Spouse/significant other   [place] level   Lives With Spouse     Prior Function   Level of  Independence Independent   Vocation Retired   Leisure works in yard, paying bridge     ADL   ADL comments modified independent with all basic ADLS     IADL   Meal Prep Plans, prepares and serves adequate meals independently  difficulty reading Microbiologist financial matters independently (budgets, writes checks, pays rent, bills goes to bank), collects and keeps track of income  uses magnifier, however has difficulty and increased time.     Mobility   Mobility Status Independent     Vision Assessment   Vision Assessment Vision tested   Per MD/OD Report OD 20 200-2,  OS 20/150+2   Reading Acuity 20/125   Patient has diffculty with activities due to visual impairment Reading bills  Pt uses magnifying spectcles for reading.   Comment Pt  reports difficulty reading bilss and the newspaper without magnification. Pt has magnifying spectacles but she also wants hand held magnifiers.     Cognition   Overall Cognitive Status Within Functional Limits for tasks assessed   Mini Mental State Exam  [place]                         OT Education - 01/19/17 0915    Education provided Yes   Education Details 4x handheld magnifier and 3x stand magnifier use, resources for purchase of AE  Pt demonstrates ability to read continues test using these items.   Person(s) Educated Patient   Methods Explanation;Demonstration;Verbal cues;Handout   Comprehension Verbalized understanding;Returned demonstration                    Plan - 01/19/17 9417    Clinical Impression Statement Pt with hx of bilateral pseudophakia of both eyes. Pt reports cataracts were removed from right eye in April 2017 and left eye December 2017. Pt presnts with visual impairments which impede reading ability and ADL performance.   Rehab Potential Good   OT Frequency One time visit   OT Duration 8 weeks   OT Treatment/Interventions Self-care/ADL training;DME and/or AE instruction;Patient/family education;Visual/perceptual remediation/compensation   Plan Pt was provided with education on day of evaluation. Pt demonstrates good understanding of all education and therefore no goals were set and additional visits are not recommended.   OT Home Exercise Plan Pt was provided with information regarding where she can purchase magnification.   Consulted and Agree with Plan of Care Patient      Patient will benefit from skilled therapeutic intervention in order to improve the following deficits and impairments:  Impaired vision/preception, Impaired perceived functional ability  Visit Diagnosis: Visuospatial deficit - Plan: Ot plan of care cert/re-cert      G-Codes - 40/81/44 0918    Functional Assessment Tool Used (Outpatient only) clinital  impressions   Functional Limitation Self care   Self Care Current Status [phone]) At least 1 percent but less than 20 percent impaired, limited or restricted   Self Care Goal Status (D1497) At least 1 percent but less than 20 percent impaired, limited or restricted   Self Care Discharge Status [phone]) At least 1 percent but less than 20 percent impaired, limited or restricted      Problem List Patient Active Problem List   Diagnosis Date Noted  . Anxiety 03/30/2016  . Osteopenia 01/27/2016  . Atrial tachycardia ([place]) 11/04/2015  . 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 Palpitations 05/13/2014  . Abnormal nuclear stress test 02/04/2014  . HTN (hypertension)  01/14/2014  . Chest pressure 01/14/2014  . Edema of foot 01/14/2014    RINE,KATHRYN 01/19/2017, 9:29 AM  [facility] [address] [place], [place], [address] Phone: [phone]   Fax:  [phone]  Name: JULINA ALTMANN MRN: [id number] Date of Birth: 12/14/1940

## 2017-01-19 NOTE — Telephone Encounter (Signed)
Notification routed to requesting provider.

## 2017-01-20 ENCOUNTER — Encounter: Payer: Self-pay | Admitting: *Deleted

## 2017-01-20 ENCOUNTER — Encounter: Payer: Self-pay | Admitting: Women's Health

## 2017-01-23 LAB — CUP PACEART REMOTE DEVICE CHECK
Implantable Pulse Generator Implant Date: 20151102
MDC IDC SESS DTM: 20180220043828

## 2017-01-25 ENCOUNTER — Encounter: Payer: Self-pay | Admitting: Women's Health

## 2017-01-25 ENCOUNTER — Other Ambulatory Visit: Payer: Self-pay | Admitting: Radiology

## 2017-01-25 DIAGNOSIS — N6089 Other benign mammary dysplasias of unspecified breast: Secondary | ICD-10-CM | POA: Diagnosis not present

## 2017-01-25 DIAGNOSIS — N6012 Diffuse cystic mastopathy of left breast: Secondary | ICD-10-CM | POA: Diagnosis not present

## 2017-02-01 ENCOUNTER — Encounter: Payer: Self-pay | Admitting: Women's Health

## 2017-02-01 ENCOUNTER — Ambulatory Visit (INDEPENDENT_AMBULATORY_CARE_PROVIDER_SITE_OTHER): Payer: Medicare Other | Admitting: *Deleted

## 2017-02-01 ENCOUNTER — Ambulatory Visit (INDEPENDENT_AMBULATORY_CARE_PROVIDER_SITE_OTHER): Payer: Medicare Other | Admitting: Women's Health

## 2017-02-01 DIAGNOSIS — Z961 Presence of intraocular lens: Secondary | ICD-10-CM | POA: Diagnosis not present

## 2017-02-01 DIAGNOSIS — H26492 Other secondary cataract, left eye: Secondary | ICD-10-CM | POA: Diagnosis not present

## 2017-02-01 DIAGNOSIS — H04123 Dry eye syndrome of bilateral lacrimal glands: Secondary | ICD-10-CM | POA: Diagnosis not present

## 2017-02-01 DIAGNOSIS — R002 Palpitations: Secondary | ICD-10-CM | POA: Diagnosis not present

## 2017-02-01 DIAGNOSIS — G4709 Other insomnia: Secondary | ICD-10-CM

## 2017-02-01 MED ORDER — ALPRAZOLAM 0.25 MG PO TABS: ORAL_TABLET | ORAL | 2 refills | Status: DC

## 2017-02-01 NOTE — Patient Instructions (Signed)
zostavac and prevnar 13 vaccines  Health Maintenance for Postmenopausal Women Menopause is a normal process in which your reproductive ability comes to an end. This process happens gradually over a span of months to years, usually between the ages of 50 and 41. Menopause is complete when you have missed 12 consecutive menstrual periods. It is important to talk with your health care provider about some of the most common conditions that affect postmenopausal women, such as heart disease, cancer, and bone loss (osteoporosis). Adopting a healthy lifestyle and getting preventive care can help to promote your health and wellness. Those actions can also lower your chances of developing some of these common conditions. What should I know about menopause? During menopause, you may experience a number of symptoms, such as:  Moderate-to-severe hot flashes.  Night sweats.  Decrease in sex drive.  Mood swings.  Headaches.  Tiredness.  Irritability.  Memory problems.  Insomnia. Choosing to treat or not to treat menopausal changes is an individual decision that you make with your health care provider. What should I know about hormone replacement therapy and supplements? Hormone therapy products are effective for treating symptoms that are associated with menopause, such as hot flashes and night sweats. Hormone replacement carries certain risks, especially as you become older. If you are thinking about using estrogen or estrogen with progestin treatments, discuss the benefits and risks with your health care provider. What should I know about heart disease and stroke? Heart disease, heart attack, and stroke become more likely as you age. This may be due, in part, to the hormonal changes that your body experiences during menopause. These can affect how your body processes dietary fats, triglycerides, and cholesterol. Heart attack and stroke are both medical emergencies. There are many things that you  can do to help prevent heart disease and stroke:  Have your blood pressure checked at least every 1-2 years. High blood pressure causes heart disease and increases the risk of stroke.  If you are 56-9 years old, ask your health care provider if you should take aspirin to prevent a heart attack or a stroke.  Do not use any tobacco products, including cigarettes, chewing tobacco, or electronic cigarettes. If you need help quitting, ask your health care provider.  It is important to eat a healthy diet and maintain a healthy weight.  Be sure to include plenty of vegetables, fruits, low-fat dairy products, and lean protein.  Avoid eating foods that are high in solid fats, added sugars, or salt (sodium).  Get regular exercise. This is one of the most important things that you can do for your health.  Try to exercise for at least 150 minutes each week. The type of exercise that you do should increase your heart rate and make you sweat. This is known as moderate-intensity exercise.  Try to do strengthening exercises at least twice each week. Do these in addition to the moderate-intensity exercise.  Know your numbers.Ask your health care provider to check your cholesterol and your blood glucose. Continue to have your blood tested as directed by your health care provider. What should I know about cancer screening? There are several types of cancer. Take the following steps to reduce your risk and to catch any cancer development as early as possible. Breast Cancer  Practice breast self-awareness.  This means understanding how your breasts normally appear and feel.  It also means doing regular breast self-exams. Let your health care provider know about any changes, no matter how small.  If you are 4 or older, have a clinician do a breast exam (clinical breast exam or CBE) every year. Depending on your age, family history, and medical history, it may be recommended that you also have a yearly  breast X-ray (mammogram).  If you have a family history of breast cancer, talk with your health care provider about genetic screening.  If you are at high risk for breast cancer, talk with your health care provider about having an MRI and a mammogram every year.  Breast cancer (BRCA) gene test is recommended for women who have family members with BRCA-related cancers. Results of the assessment will determine the need for genetic counseling and BRCA1 and for BRCA2 testing. BRCA-related cancers include these types:  Breast. This occurs in males or females.  Ovarian.  Tubal. This may also be called fallopian tube cancer.  Cancer of the abdominal or pelvic lining (peritoneal cancer).  Prostate.  Pancreatic. Cervical, Uterine, and Ovarian Cancer  Your health care provider may recommend that you be screened regularly for cancer of the pelvic organs. These include your ovaries, uterus, and vagina. This screening involves a pelvic exam, which includes checking for microscopic changes to the surface of your cervix (Pap test).  For women ages 21-65, health care providers may recommend a pelvic exam and a Pap test every three years. For women ages 87-65, they may recommend the Pap test and pelvic exam, combined with testing for human papilloma virus (HPV), every five years. Some types of HPV increase your risk of cervical cancer. Testing for HPV may also be done on women of any age who have unclear Pap test results.  Other health care providers may not recommend any screening for nonpregnant women who are considered low risk for pelvic cancer and have no symptoms. Ask your health care provider if a screening pelvic exam is right for you.  If you have had past treatment for cervical cancer or a condition that could lead to cancer, you need Pap tests and screening for cancer for at least 20 years after your treatment. If Pap tests have been discontinued for you, your risk factors (such as having a new  sexual partner) need to be reassessed to determine if you should start having screenings again. Some women have medical problems that increase the chance of getting cervical cancer. In these cases, your health care provider may recommend that you have screening and Pap tests more often.  If you have a family history of uterine cancer or ovarian cancer, talk with your health care provider about genetic screening.  If you have vaginal bleeding after reaching menopause, tell your health care provider.  There are currently no reliable tests available to screen for ovarian cancer. Lung Cancer  Lung cancer screening is recommended for adults 49-72 years old who are at high risk for lung cancer because of a history of smoking. A yearly low-dose CT scan of the lungs is recommended if you:  Currently smoke.  Have a history of at least 30 pack-years of smoking and you currently smoke or have quit within the past 15 years. A pack-year is smoking an average of one pack of cigarettes per day for one year. Yearly screening should:  Continue until it has been 15 years since you quit.  Stop if you develop a health problem that would prevent you from having lung cancer treatment. Colorectal Cancer  This type of cancer can be detected and can often be prevented.  Routine colorectal cancer screening usually  begins at age 2 and continues through age 12.  If you have risk factors for colon cancer, your health care provider may recommend that you be screened at an earlier age.  If you have a family history of colorectal cancer, talk with your health care provider about genetic screening.  Your health care provider may also recommend using home test kits to check for hidden blood in your stool.  A small camera at the end of a tube can be used to examine your colon directly (sigmoidoscopy or colonoscopy). This is done to check for the earliest forms of colorectal cancer.  Direct examination of the colon  should be repeated every 5-10 years until age 43. However, if early forms of precancerous polyps or small growths are found or if you have a family history or genetic risk for colorectal cancer, you may need to be screened more often. Skin Cancer  Check your skin from head to toe regularly.  Monitor any moles. Be sure to tell your health care provider:  About any new moles or changes in moles, especially if there is a change in a mole's shape or color.  If you have a mole that is larger than the size of a pencil eraser.  If any of your family members has a history of skin cancer, especially at a young age, talk with your health care provider about genetic screening.  Always use sunscreen. Apply sunscreen liberally and repeatedly throughout the day.  Whenever you are outside, protect yourself by wearing long sleeves, pants, a wide-brimmed hat, and sunglasses. What should I know about osteoporosis? Osteoporosis is a condition in which bone destruction happens more quickly than new bone creation. After menopause, you may be at an increased risk for osteoporosis. To help prevent osteoporosis or the bone fractures that can happen because of osteoporosis, the following is recommended:  If you are 28-57 years old, get at least 1,000 mg of calcium and at least 600 mg of vitamin D per day.  If you are older than age 65 but younger than age 70, get at least 1,200 mg of calcium and at least 600 mg of vitamin D per day.  If you are older than age 101, get at least 1,200 mg of calcium and at least 800 mg of vitamin D per day. Smoking and excessive alcohol intake increase the risk of osteoporosis. Eat foods that are rich in calcium and vitamin D, and do weight-bearing exercises several times each week as directed by your health care provider. What should I know about how menopause affects my mental health? Depression may occur at any age, but it is more common as you become older. Common symptoms of  depression include:  Low or sad mood.  Changes in sleep patterns.  Changes in appetite or eating patterns.  Feeling an overall lack of motivation or enjoyment of activities that you previously enjoyed.  Frequent crying spells. Talk with your health care provider if you think that you are experiencing depression. What should I know about immunizations? It is important that you get and maintain your immunizations. These include:  Tetanus, diphtheria, and pertussis (Tdap) booster vaccine.  Influenza every year before the flu season begins.  Pneumonia vaccine.  Shingles vaccine. Your health care provider may also recommend other immunizations. This information is not intended to replace advice given to you by your health care provider. Make sure you discuss any questions you have with your health care provider. Document Released: 12/23/2005 Document Revised: 05/20/2016  Document Reviewed: 08/04/2015 Elsevier Interactive Patient Education  2017 [employer].

## 2017-02-01 NOTE — Progress Notes (Signed)
Kelsey James 29-Oct-1941 [id number]    History:    Presents for a breast and pelvic exam. Postmenopausal with no bleeding on no HRT. 01/2017 questionable left breast mass at 2:00 area/ neg biopsy . Patient presents a bruise, mild pain and no lump at the biopsy site. Cardiology follow up for atrial fibrillation, with cardiac recorder, on xarelto daily. DEXA 2017 improvement on osteopenia, T score -1.7 at femoral neck FRAX 9.9%/1.9%.. Normal Pap history. Colonoscopy 2015 Dr. Collene Mares. Declined Pneumovax and Zostavax. Cataract surgery last year, glaucoma.  Past medical history, past surgical history, family history and social history were all reviewed and documented in the EPIC chart. Retired. Husband with heath problems.   ROS:  A ROS was performed and pertinent positives and negatives are included.  Exam:  Vitals:   02/01/17 1411  BP: 112/72  Weight: 120 lb 9.6 oz (54.7 kg)  Height: 5' 3.5" (1.613 m)   Body mass index is 21.03 kg/m.   General appearance:  Normal Thyroid:  Symmetrical, normal in size, without palpable masses or nodularity. Respiratory  Auscultation:  Clear without wheezing or rhonchi Cardiovascular  Auscultation:  Regular rate, without rubs, murmurs or gallops  Edema/varicosities:  Not grossly evident Abdominal  Soft,nontender, without masses, guarding or rebound.  Liver/spleen:  No organomegaly noted  Hernia:  None appreciated  Skin  Inspection:  Grossly normal   Breasts: Examined lying and sitting.     Right: Without masses, retractions, discharge or axillary adenopathy.     Left: Without masses, retractions, discharge or axillary adenopathy. Gentitourinary   Inguinal/mons:  Normal without inguinal adenopathy  External genitalia:  Normal  BUS/Urethra/Skene's glands:  Normal  Vagina:  atrophic  Cervix:  Normal  Uterus:   normal in size, shape and contour.  Midline and mobile  Adnexa/parametria:     Rt: Without masses or tenderness.   Lt: Without masses or  tenderness.  Anus and perineum: Normal  Digital rectal exam: Normal sphincter tone without palpated masses or tenderness  Assessment/Plan:  76 y.o.  for breast and pelvic exam with no complaints.   Postmenopausal/, no bleeding /vaginal atrophy, sexually active/husbands health Left breast at 2 a clock  biopsy site ecchymosis Anxiety-rare Xanax use Osteopenia without elevated FRAX Atrial fib on Xarelto managed by cardiologist    Plan: SBEs, continue annual screening  mammogram . Instructed to call if any vaginal bleeding, calcium rich diet, Vit. D 2000 U daily. Continue active lifestyle.  Encouraged to increase regular exercise, yoga, home safety and fall prevention discussed.  Refill xanax 0.25 daily as needed, aware to use sparingly.Marland Kitchen    [facility], 2:54 PM 02/01/2017

## 2017-02-02 NOTE — Progress Notes (Signed)
Carelink Summary Report / Loop Recorder 

## 2017-02-07 ENCOUNTER — Ambulatory Visit (INDEPENDENT_AMBULATORY_CARE_PROVIDER_SITE_OTHER): Payer: Medicare Other | Admitting: Internal Medicine

## 2017-02-07 VITALS — BP 108/74 | HR 74 | Ht 63.0 in | Wt 121.0 lb

## 2017-02-07 DIAGNOSIS — I10 Essential (primary) hypertension: Secondary | ICD-10-CM

## 2017-02-07 DIAGNOSIS — I480 Paroxysmal atrial fibrillation: Secondary | ICD-10-CM

## 2017-02-07 DIAGNOSIS — I471 Supraventricular tachycardia: Secondary | ICD-10-CM

## 2017-02-07 NOTE — Patient Instructions (Addendum)
Your physician wants you to follow-up in: 6 months with Dr. Hilty. You will receive a reminder letter in the mail two months in advance. If you don't receive a letter, please call our office to schedule the follow-up appointment.    

## 2017-02-08 DIAGNOSIS — I480 Paroxysmal atrial fibrillation: Secondary | ICD-10-CM | POA: Insufficient documentation

## 2017-02-08 NOTE — Progress Notes (Signed)
OFFICE NOTE  Chief Complaint:  Follow-up palpitations  Primary Care Physician: Charolette Forward, PA-C  HPI:  Kelsey James is a pleasant 76 year old female with little past medical history. For years she's had very low blood pressure however recently her blood pressure has spiked up into the upper 180's to over 034 systolic. She recently presented to the emergency room and any pain with symptoms such as throbbing in her neck and head with uncontrolled blood pressure. Subsequently she was started on low-dose amlodipine and she's had a nice improvement in her blood pressure. She does however report lower extremity swelling in her feet. She also describes what I believe is chest tightness. She actually says that she feels that her chest is "caving in" or "deflating". There is some associated sporadic shortness of breath but it has improved with her blood pressure medicine. She does do some activity and exercise and reports that her symptoms improve with exercise.  Kelsey James had a recent nuclear stress test which demonstrated reversible distal anteroapical and anterolateral reversible ischemia. EF of 69%.  I interpreted as intermediate risk due to the distal location of the ischemia however is abnormal. She is relatively thin and I did not suspect this is due to attenuation artifact.  Ultimately she was referred for cardiac catheterization.  This was performed by myself and demonstrated normal coronary arteries. Subsequently she been having palpitations and I arranged for a monitor. She wore that monitor her for 2 weeks between 03/28/2014 and 04/11/2014. Total monitoring time was 288 hours and 50 minutes. Lowest heart rate was 41 and average heart rate was 59. She was noted to have PACs, short runs of PSVT up to 14 beats were also noted.  This likely explains her palpitations.  Given her bradycardia, I recommended discontinuing her timolol, which is the only medication that I  can see which could have been causing her bradycardia. She now presents today as a walk-in with feelings of her heart thumping. She was playing progressive bridge today and she started having heaviness in her chest. She felt like her heart was racing and she was short of breath, dizzy and like she was going to pass out. An EKG was performed in the office today which showed normal sinus rhythm at 71. Blood pressure was normal.  Kelsey James returns today for follow-up. In the interim she is seeing Dr. Jolyn Nap for evaluation of her palpitations and arrhythmia. She was felt to have 2 separate items. The first being palpitations and an ectopic atrial tachycardia with PACs which he felt were most likely benign given her normal coronary anatomy. The second was concerning more for atrial fibrillation, which relates to the episodes where she feels like her chest is "caving in". I discussed multiple ways to try to evaluate for arrhythmias, but given the infrequency of the events, ultimately he recommended an implantable loop recorder. She did undergo successful loop recorder placement and has had 2 transmissions that I reviewed, neither of which have demonstrated any atrial fibrillation. She continues to have some palpitations. She does related somewhat to caffeine and particular chocolate. She reports eating moose tracks ice cream every evening. I did ask that she may want to decrease some of her chocolate intake. Recently she's had problems with hypokalemia. We will plan to recheck a BMET today.  Kelsey James was seen by Dr. Caryl Comes after there was identification of a paroxysmal  atrial tachycardia on her implanted loop recorder. He recommended she start on flecainide 50 mg twice daily. Since starting this medicine she reports feeling horrible. Subsequently she followed up with Chanetta Marshall, NP, who recommended that she discontinue her flecainide. There is been consideration about alternative antiarrhythmics. At this  time she wishes to stay off of anti-rhythmic medication.  I had the pleasure seeing Kelsey James back today in the office. In the end of December she underwent ablation of an inducible atrial tachycardia by Dr. Curt Bears. This appears to been fairly successful at reducing her burden of atrial tachycardia. Although she still has had some breakthroughs. She does have an implantable loop recorder which will be interrogated this month. She has a follow-up appointment with him next week. Her only other concern is that she is having some labile blood pressures. She says she's gained 45 pounds over the holidays and notes her blood pressures a little higher. At times he goes up to 170 but generally ranges between 120 and 140. I do not necessarily see a need to treat that at this time. I asked her to keep records of her blood pressures over the next several weeks.  03/30/2016   Kelsey James returns today for follow-up. Overall she feels like she is doing well. She had as mentioned undergone ablation of an inducible atrial tachycardia by Dr. Curt Bears. She says she still has very infrequent episodes about 2 or 3 times a month of palpitations.  Blood pressure has again been elevated and in the past she had been on amlodipine. She's had some labile blood pressures as evidenced by a recent ophthalmology visit were blood pressure was 161/90.   However blood pressure today is normal at 128/70. There may be an anxiety component to this. She was placed on carvedilol which she felt very bad on and did not want to take beta blockers. She was then given diltiazem and HCTZ. She feels that the diltiazem is not helpful enough for her blood pressure.  We did discuss some alternatives. In the past she been on amlodipine however that is not likely to give her any benefit with her palpitations. Another option would be an older calcium channel blocker such as verapamil , although more likely to have side effects such as constipation and  swelling.  05/12/2016  Kelsey James returns today for follow-up. In the interim she saw Erasmo Downer our anticoagulation and hypertension pharmacist. Medications adjustments were made and she was taken off of diltiazem and is currently on amlodipine 5 mg daily. Blood pressure today was excellent 112/70. I checked her blood pressure with her home cuff which read 130/80. I then repeated her blood pressure which was 110/70 manual. This suggests her blood pressure cuff may be about 15-20 points higher than it should be. I advised that she may wish to get a new blood pressure cuff. She is interested in getting her loop recorder explant it as she's had no further fast palpitations after atrial tachycardia ablation.  02/07/2017  Kelsey James returns today for follow-up. She seems to be doing well on Xarelto for anticoagulation. She had recently seen Dr. Curt Bears and has a follow-up with him in May. She has an implanted loop recorder and is interested in having that removed. Overall she is without complaints. She is not on any AV nodal blocker but remains in sinus rhythm at 63 with PACs today.  PMHx:  Past Medical History:  Diagnosis Date  . A-fib ([place])   . Arthritis    "hands" (  11/04/2015)  . Dry eyes   . Glaucoma   . Osteopenia   . Rosacea   . Wears glasses     Past Surgical History:  Procedure Laterality Date  . ATRIAL TACH ABLATION  11/04/2015  . COLONOSCOPY    . ELECTROPHYSIOLOGIC STUDY N/A 11/04/2015   Procedure: A-Tach Ablation;  Surgeon: Will [name], MD;  Location: [place] [facility];  Service: Cardiovascular;  Laterality: N/A;  . LEFT HEART CATHETERIZATION WITH CORONARY ANGIOGRAM N/A 03/12/2014   Procedure: LEFT HEART CATHETERIZATION WITH CORONARY ANGIOGRAM;  Surgeon: Pixie Casino, MD;  Location: [facility] CATH LAB;  Service: Cardiovascular;  Laterality: N/A;  . LOOP RECORDER IMPLANT N/A 09/15/2014   Procedure: LOOP RECORDER IMPLANT;  Surgeon: Deboraha Sprang, MD;  Location: [facility] CATH LAB;   Service: Cardiovascular;  Laterality: N/A;  . MASS EXCISION Right 09/25/2014   Procedure: RIGHT RING FINGER EXCISION MASS AND DEBRIDEMENT DIP JOINT;  Surgeon: Leanora Cover, MD;  Location: [place];  Service: Orthopedics;  Laterality: Right;  . TONSILLECTOMY AND ADENOIDECTOMY  1979  . TUBAL LIGATION      FAMHx:  Family History  Problem Relation Age of Onset  . Valvular heart disease Mother   . Depression Father   . Cancer Maternal Grandmother     SOCHx:   reports that she quit smoking about 14 years ago. Her smoking use included Cigarettes. She has a 12.50 pack-year smoking history. She has never used smokeless tobacco. She reports that she drinks about 4.2 oz of alcohol per week . She reports that she does not use drugs.  ALLERGIES:  Allergies  Allergen Reactions  . Latex Hives    Cracks in skin  . Other Nausea And Vomiting    Mushrooms   . Shellfish Allergy Nausea And Vomiting    ROS: Pertinent items noted in HPI and remainder of comprehensive ROS otherwise negative.  HOME MEDS: Current Outpatient Prescriptions  Medication Sig Dispense Refill  . acetaminophen (TYLENOL) 500 MG tablet Take 500-1,000 mg by mouth every 6 (six) hours as needed (pain).    Marland Kitchen ALPRAZolam (XANAX) 0.25 MG tablet TAKE 1 TABLET BY MOUTH AT BEDTIME AS NEEDED FOR ANXIETY 30 tablet 2  . amLODipine (NORVASC) 5 MG tablet Take 1 tablet (5 mg total) by mouth daily. 90 tablet 3  . Biotin 1000 MCG tablet Take 1,000 mcg by mouth daily.    . Calcium-Magnesium (CALCIUM MAGNESIUM 750) 300-300 MG TABS Take 1 tablet by mouth 2 (two) times daily.     . Cholecalciferol (VITAMIN D-1000 MAX ST) 1000 units tablet Take 2,000 Units by mouth daily.    . hydroxypropyl methylcellulose (ISOPTO TEARS) 2.5 % ophthalmic solution Place 1 drop into both eyes as needed for dry eyes.    Marland Kitchen LECITHIN PO Take 1 capsule by mouth daily.    Marland Kitchen MAGNESIUM PO Take 1 tablet by mouth 2 (two) times daily.     . Omega-3 Fatty Acids  (FISH OIL PO) Take 1 capsule by mouth 2 (two) times daily.     Marland Kitchen OVER THE COUNTER MEDICATION Take 1 capsule by mouth 2 (two) times daily. Vegetal Silica    . OVER THE COUNTER MEDICATION Take 1 capsule by mouth 2 (two) times daily. Perfect Eyes supplement    . polyvinyl alcohol (ARTIFICIAL TEARS) 1.4 % ophthalmic solution Place 1 drop into both eyes 4 (four) times daily.    . travoprost, benzalkonium, (TRAVATAN) 0.004 % ophthalmic solution 1 drop at bedtime.    Marland Kitchen  ZIOPTAN 0.0015 % SOLN Place 1 drop into both eyes daily.     Alveda Reasons 20 MG TABS tablet Take 1 tablet by mouth daily.     No current facility-administered medications for this visit.     LABS/IMAGING: No results found for this or any previous visit (from the past 48 hour(s)). No results found.  VITALS: BP 108/74 (BP Location: Right Arm, Patient Position: Sitting, Cuff Size: Normal)   Pulse 74   Ht 5\' 3"  (1.6 m)   Wt 121 lb (54.9 kg)   BMI 21.43 kg/m   EXAM: General appearance: alert and no distress Lungs: clear to auscultation bilaterally Heart: regular rate and rhythm, S1, S2 normal, no murmur, click, rub or gallop Extremities: extremities normal, atraumatic, no cyanosis or edema Neurologic: Grossly normal  EKG: Sinus rhythm at 63 with PACs  ASSESSMENT: 1. Labile hypertension - improved 2. Palpitations - resolved (s/p loop recorder still implanted) 3. Ectopic atrial tachycardia status post ablation 4. New onset atrial fibrillation - now on Xarelto, CHADSVASC score of 3  PLAN: 1.   Kelsey James has had atrial fibrillation and previously an ectopic atrial tachycardia status post ablation. She has a CHADSVASC score 3 is appropriately anticoagulant on Xarelto. She desires having her loop recorder explanted and she will discuss this further with Dr. Curt Bears when she returns in May. She is not currently on an AV nodal blocker. This could leave her at risk for A. fib with RVR. I will defer this to EP as she may benefit from  low-dose medication.  Follow-up with me in 6 months.  Pixie Casino, MD, [facility] Attending Cardiologist [place] Xiao Graul 02/08/2017, 5:34 PM

## 2017-02-13 LAB — CUP PACEART REMOTE DEVICE CHECK
Date Time Interrogation Session: 20180322043649
MDC IDC PG IMPLANT DT: 20151102

## 2017-03-03 ENCOUNTER — Ambulatory Visit (INDEPENDENT_AMBULATORY_CARE_PROVIDER_SITE_OTHER): Payer: Medicare Other | Admitting: *Deleted

## 2017-03-03 DIAGNOSIS — R002 Palpitations: Secondary | ICD-10-CM

## 2017-03-06 NOTE — Progress Notes (Signed)
Carelink Summary Report / Loop Recorder 

## 2017-03-08 DIAGNOSIS — H4053X3 Glaucoma secondary to other eye disorders, bilateral, severe stage: Secondary | ICD-10-CM | POA: Diagnosis not present

## 2017-03-10 LAB — CUP PACEART REMOTE DEVICE CHECK
Date Time Interrogation Session: 20180421053638
MDC IDC PG IMPLANT DT: 20151102

## 2017-03-14 DIAGNOSIS — R002 Palpitations: Secondary | ICD-10-CM | POA: Diagnosis not present

## 2017-03-14 DIAGNOSIS — R110 Nausea: Secondary | ICD-10-CM | POA: Diagnosis not present

## 2017-03-14 DIAGNOSIS — K529 Noninfective gastroenteritis and colitis, unspecified: Secondary | ICD-10-CM | POA: Diagnosis not present

## 2017-03-15 DIAGNOSIS — R001 Bradycardia, unspecified: Secondary | ICD-10-CM | POA: Diagnosis not present

## 2017-03-22 DIAGNOSIS — L820 Inflamed seborrheic keratosis: Secondary | ICD-10-CM | POA: Diagnosis not present

## 2017-03-22 DIAGNOSIS — H01009 Unspecified blepharitis unspecified eye, unspecified eyelid: Secondary | ICD-10-CM | POA: Diagnosis not present

## 2017-03-22 DIAGNOSIS — Z1283 Encounter for screening for malignant neoplasm of skin: Secondary | ICD-10-CM | POA: Diagnosis not present

## 2017-03-29 ENCOUNTER — Encounter: Payer: Self-pay | Admitting: Gynecology

## 2017-03-29 ENCOUNTER — Encounter: Payer: Medicare Other | Admitting: Cardiology

## 2017-04-03 ENCOUNTER — Ambulatory Visit (INDEPENDENT_AMBULATORY_CARE_PROVIDER_SITE_OTHER): Payer: Medicare Other | Admitting: *Deleted

## 2017-04-03 DIAGNOSIS — R002 Palpitations: Secondary | ICD-10-CM

## 2017-04-03 NOTE — Progress Notes (Signed)
Carelink Summary Report / Loop Recorder 

## 2017-04-05 DIAGNOSIS — T63481A Toxic effect of venom of other arthropod, accidental (unintentional), initial encounter: Secondary | ICD-10-CM | POA: Diagnosis not present

## 2017-04-06 LAB — CUP PACEART REMOTE DEVICE CHECK
MDC IDC PG IMPLANT DT: 20151102
MDC IDC SESS DTM: 20180521054057

## 2017-04-12 ENCOUNTER — Encounter: Payer: Self-pay | Admitting: Cardiology

## 2017-04-25 ENCOUNTER — Ambulatory Visit (INDEPENDENT_AMBULATORY_CARE_PROVIDER_SITE_OTHER): Payer: Medicare Other | Admitting: Cardiology

## 2017-04-25 ENCOUNTER — Encounter: Payer: Self-pay | Admitting: Cardiology

## 2017-04-25 VITALS — BP 110/62 | HR 68 | Ht 64.0 in | Wt 119.8 lb

## 2017-04-25 DIAGNOSIS — I471 Supraventricular tachycardia: Secondary | ICD-10-CM

## 2017-04-25 DIAGNOSIS — I480 Paroxysmal atrial fibrillation: Secondary | ICD-10-CM

## 2017-04-25 NOTE — Progress Notes (Signed)
Electrophysiology Office Note   Date:  04/25/2017   ID:  RECHY BOST, DOB 14-Feb-1941, MRN [id number]  PCP:  Dewayne Shorter, PA-C  Cardiologist:  Debara Pickett Primary Electrophysiologist:  Nahiem Dredge [name], MD    Chief Complaint  Patient presents with  . Pacemaker Check    PAF/A Tachycardia     History of Present Illness: Kelsey James is a 76 y.o. female who presents today for electrophysiology evaluation.   She had an atrial tachycardia ablation on 12/21.  She was found to have atrial fibrillation on monitor on 08/29/16.    Today, denies symptoms of palpitations, chest pain, shortness of breath, orthopnea, PND, lower extremity edema, claudication, dizziness, presyncope, syncope, bleeding, or neurologic sequela. The patient is tolerating medications without difficulties and is otherwise without complaint today. She does say that she gets anxious, mainly when she is playing competitive bridge. She notes that when she is anxious she gets palpitations. She feels that she has been able to control her palpitations with breathing exercises. She does not have fatigue or shortness of breath associated with her palpitations. Review of her Linq monitor shows 2 episodes of atrial fibrillation lasting between 6 seconds and 2-1/2 minutes.   Past Medical History:  Diagnosis Date  . A-fib ([place])   . Arthritis    "hands" (11/04/2015)  . Dry eyes   . Glaucoma   . Osteopenia   . Rosacea   . Wears glasses    Past Surgical History:  Procedure Laterality Date  . ATRIAL TACH ABLATION  11/04/2015  . COLONOSCOPY    . ELECTROPHYSIOLOGIC STUDY N/A 11/04/2015   Procedure: A-Tach Ablation;  Surgeon: Lowen Barringer [name], MD;  Location: [place] [facility];  Service: Cardiovascular;  Laterality: N/A;  . LEFT HEART CATHETERIZATION WITH CORONARY ANGIOGRAM N/A 03/12/2014   Procedure: LEFT HEART CATHETERIZATION WITH CORONARY ANGIOGRAM;  Surgeon: Pixie Casino, MD;  Location: [facility] CATH LAB;  Service:  Cardiovascular;  Laterality: N/A;  . LOOP RECORDER IMPLANT N/A 09/15/2014   Procedure: LOOP RECORDER IMPLANT;  Surgeon: Deboraha Sprang, MD;  Location[facility] C[facility] LAB;  Service: Cardiovascular;  Laterality: N/A;  . MASS EXCISION Right 09/25/2014   Procedure: RIGHT RING FINGER EXCISION MASS AND DEBRIDEMENT DIP JOINT;  Surgeon: Leanora Cover, MD;  Location: [place];  Service: Orthopedics;  Laterality: Right;  . TONSILLECTOMY AND ADENOIDECTOMY  1979  . TUBAL LIGATION       Current Outpatient Prescriptions  Medication Sig Dispense Refill  . acetaminophen (TYLENOL) 500 MG tablet Take 500-1,000 mg by mouth every 6 (six) hours as needed (pain).    Marland Kitchen ALPRAZolam (XANAX) 0.25 MG tablet TAKE 1 TABLET BY MOUTH AT BEDTIME AS NEEDED FOR ANXIETY 30 tablet 2  . amLODipine (NORVASC) 5 MG tablet Take 1 tablet (5 mg total) by mouth daily. 90 tablet 3  . Biotin 1000 MCG tablet Take 1,000 mcg by mouth daily.    . Calcium-Magnesium (CALCIUM MAGNESIUM 750) 300-300 MG TABS Take 1 tablet by mouth 2 (two) times daily.     . Cholecalciferol (VITAMIN D-1000 MAX ST) 1000 units tablet Take 2,000 Units by mouth daily.    . hydroxypropyl methylcellulose (ISOPTO TEARS) 2.5 % ophthalmic solution Place 1 drop into both eyes as needed for dry eyes.    Marland Kitchen MAGNESIUM PO Take 1 tablet by mouth 2 (two) times daily.     . Omega-3 Fatty Acids (FISH OIL PO) Take 1 capsule by mouth 2 (two) times daily.     Marland Kitchen  OVER THE COUNTER MEDICATION Take 1 capsule by mouth 2 (two) times daily. Vegetal Silica    . OVER THE COUNTER MEDICATION Take 1 capsule by mouth 2 (two) times daily. Perfect Eyes supplement    . travoprost, benzalkonium, (TRAVATAN) 0.004 % ophthalmic solution 1 drop at bedtime.    Alveda Reasons 20 MG TABS tablet Take 1 tablet by mouth daily.     No current facility-administered medications for this visit.     Allergies:   Latex; Other; and Shellfish allergy   Social History:  The patient  reports that she quit smoking  about 14 years ago. Her smoking use included Cigarettes. She has a 12.50 pack-year smoking history. She has never used smokeless tobacco. She reports that she drinks about 4.2 oz of alcohol per week . She reports that she does not use drugs.   Family History:  The patient's family history includes Cancer in her maternal grandmother; Depression in her father; Valvular heart disease in her mother.    ROS:  Please see the history of present illness.   Otherwise, review of systems is positive for anxiety, palpitations.   All other systems are reviewed and negative.     PHYSICAL EXAM: VS:  BP 110/62   Pulse 68   Ht 5\' 4"  (1.626 m)   Wt 119 lb 12.8 oz (54.3 kg)   BMI 20.56 kg/m  , BMI Body mass index is 20.56 kg/m. GEN: Well nourished, well developed, in no acute distress  HEENT: normal  Neck: no JVD, carotid bruits, or masses Cardiac: RRR; no murmurs, rubs, or gallops,no edema  Respiratory:  clear to auscultation bilaterally, normal work of breathing GI: soft, nontender, nondistended, + BS MS: no deformity or atrophy  Skin: warm and dry, device site well healed Neuro:  Strength and sensation are intact Psych: euthymic mood, full affect  EKG:  EKG is not ordered today. Personal review of the ekg ordered 02/07/17 shows sinus rhythm, PRWP   Personal review of the device interrogation today. Results in [place]: 09/22/2016: BUN 11; Creat 0.57; Potassium 3.9; Sodium 139    Lipid Panel  No results found for: CHOL, TRIG, HDL, CHOLHDL, VLDL, LDLCALC, LDLDIRECT   Wt Readings from Last 3 Encounters:  04/25/17 119 lb 12.8 oz (54.3 kg)  02/07/17 121 lb (54.9 kg)  02/01/17 120 lb 9.6 oz (54.7 kg)    ASSESSMENT AND PLAN:  1.  Atrial tachycardia: Status post ablation at 1:00 on the tricuspid valve. No further evidence of atrial tachycardia.  2. Hypertension: Well-controlled today  3.  Paroxysmal atrial fibrillation: Currently on Xarelto. Has short episodes of tachycardia,  with rapid rates. She feels like she can control her rates with deep breathing. We'll not start her on rate control at this time. Linq monitor shows only 2 episodes of tachycardia.  This patients CHA2DS2-VASc Score and unadjusted Ischemic Stroke Rate (% per year) is equal to 3.2 % stroke rate/year from a score of 3  Above score calculated as 1 point each if present [CHF, HTN, DM, Vascular=MI/PAD/Aortic Plaque, Age if 65-74, or Female] Above score calculated as 2 points each if present [Age > 75, or Stroke/TIA/TE]   Current medicines are reviewed at length with the patient today.   The patient does not have concerns regarding her medicines.  The following changes were made today: none  Labs/ tests ordered today include:   No orders of the defined types were placed in this encounter.    Disposition:  FU with [place] Scheidegger 6 months  Signed, Kailie Polus [name], MD  04/25/2017 11:06 AM     [facility] [address] [place] [address] [phone] (office) [phone] (fax)

## 2017-04-25 NOTE — Patient Instructions (Signed)
Medication Instructions:  Your physician recommends that you continue on your current medications as directed. Please refer to the Current Medication list given to you today.  If you need a refill on your cardiac medications before your next appointment, please call your pharmacy.   Labwork: None ordered  Testing/Procedures: None ordered  Follow-Up: Your physician wants you to follow-up in: 6 months with Dr. Camnitz.  You will receive a reminder letter in the mail two months in advance. If you don't receive a letter, please call our office to schedule the follow-up appointment.  Thank you for choosing [facility]!!   Keyira Mondesir, RN [phone]         

## 2017-05-03 ENCOUNTER — Ambulatory Visit (INDEPENDENT_AMBULATORY_CARE_PROVIDER_SITE_OTHER): Payer: Medicare Other | Admitting: *Deleted

## 2017-05-03 DIAGNOSIS — R002 Palpitations: Secondary | ICD-10-CM

## 2017-05-04 NOTE — Progress Notes (Signed)
Carelink Summary Report / Loop Recorder 

## 2017-05-10 LAB — CUP PACEART REMOTE DEVICE CHECK
Date Time Interrogation Session: 20180620053823
MDC IDC PG IMPLANT DT: 20151102

## 2017-05-13 ENCOUNTER — Other Ambulatory Visit: Payer: Self-pay | Admitting: Internal Medicine

## 2017-05-22 ENCOUNTER — Other Ambulatory Visit: Payer: Self-pay | Admitting: Internal Medicine

## 2017-05-22 ENCOUNTER — Other Ambulatory Visit: Payer: Self-pay | Admitting: Women's Health

## 2017-05-22 DIAGNOSIS — G4709 Other insomnia: Secondary | ICD-10-CM

## 2017-05-29 NOTE — Telephone Encounter (Signed)
Rx called in 

## 2017-06-02 ENCOUNTER — Ambulatory Visit (INDEPENDENT_AMBULATORY_CARE_PROVIDER_SITE_OTHER): Payer: Medicare Other | Admitting: *Deleted

## 2017-06-02 DIAGNOSIS — R002 Palpitations: Secondary | ICD-10-CM

## 2017-06-06 NOTE — Progress Notes (Signed)
Carelink Summary Report / Loop Recorder 

## 2017-06-07 ENCOUNTER — Telehealth: Payer: Self-pay | Admitting: Cardiology

## 2017-06-07 NOTE — Telephone Encounter (Signed)
LMOVM requesting that pt send manual transmission b/c home monitor has not updated in at least 14 days.    

## 2017-06-18 LAB — CUP PACEART REMOTE DEVICE CHECK
Implantable Pulse Generator Implant Date: 20151102
MDC IDC SESS DTM: 20180720164734

## 2017-06-18 NOTE — Progress Notes (Signed)
Carelink summary report received. Battery status OK. Normal device function. No new symptom episodes, brady, or pause episodes. No new AF episodes. 2 tachy- 1 w/ ECG appears SVT burst. Monthly summary reports and ROV/PRN

## 2017-06-21 ENCOUNTER — Telehealth: Payer: Self-pay | Admitting: *Deleted

## 2017-06-21 NOTE — Telephone Encounter (Signed)
LVMOM regarding LINQ at RRT since June 12, 2017. Gave device clinic phone number to call back to discuss next steps.

## 2017-06-26 NOTE — Telephone Encounter (Signed)
Spoke with patient regarding LINQ at RRT.  She wishes to schedule an appointment to discuss explant, preferably on a Tuesday.  Patient is agreeable to appointment on 09/19/17 at 2:30pm.  She is agreeable to receiving a return kit shipped to her home address.  Unenrolled from [place], return kit ordered.

## 2017-07-03 ENCOUNTER — Encounter: Payer: Medicare Other | Admitting: *Deleted

## 2017-07-03 DIAGNOSIS — H4053X3 Glaucoma secondary to other eye disorders, bilateral, severe stage: Secondary | ICD-10-CM | POA: Diagnosis not present

## 2017-07-18 ENCOUNTER — Encounter: Payer: Self-pay | Admitting: Internal Medicine

## 2017-07-18 ENCOUNTER — Ambulatory Visit (INDEPENDENT_AMBULATORY_CARE_PROVIDER_SITE_OTHER): Payer: Medicare Other | Admitting: Internal Medicine

## 2017-07-18 VITALS — BP 121/75 | HR 59 | Ht 63.0 in | Wt 120.2 lb

## 2017-07-18 DIAGNOSIS — I471 Supraventricular tachycardia: Secondary | ICD-10-CM | POA: Diagnosis not present

## 2017-07-18 DIAGNOSIS — F419 Anxiety disorder, unspecified: Secondary | ICD-10-CM | POA: Diagnosis not present

## 2017-07-18 DIAGNOSIS — I480 Paroxysmal atrial fibrillation: Secondary | ICD-10-CM

## 2017-07-18 DIAGNOSIS — I4719 Other supraventricular tachycardia: Secondary | ICD-10-CM

## 2017-07-18 NOTE — Progress Notes (Signed)
OFFICE NOTE  Chief Complaint:  Follow-up palpitations  Primary Care Physician: Dewayne Shorter, PA-C  HPI:  Kelsey James is a pleasant 76 year old female with little past medical history. For years she's had very low blood pressure however recently her blood pressure has spiked up into the upper 180's to over 462 systolic. She recently presented to the emergency room and any pain with symptoms such as throbbing in her neck and head with uncontrolled blood pressure. Subsequently she was started on low-dose amlodipine and she's had a nice improvement in her blood pressure. She does however report lower extremity swelling in her feet. She also describes what I believe is chest tightness. She actually says that she feels that her chest is "caving in" or "deflating". There is some associated sporadic shortness of breath but it has improved with her blood pressure medicine. She does do some activity and exercise and reports that her symptoms improve with exercise.  Kelsey James had a recent nuclear stress test which demonstrated reversible distal anteroapical and anterolateral reversible ischemia. EF of 69%.  I interpreted as intermediate risk due to the distal location of the ischemia however is abnormal. She is relatively thin and I did not suspect this is due to attenuation artifact.  Ultimately she was referred for cardiac catheterization.  This was performed by myself and demonstrated normal coronary arteries. Subsequently she been having palpitations and I arranged for a monitor. She wore that monitor her for 2 weeks between 03/28/2014 and 04/11/2014. Total monitoring time was 288 hours and 50 minutes. Lowest heart rate was 41 and average heart rate was 59. She was noted to have PACs, short runs of PSVT up to 14 beats were also noted.  This likely explains her palpitations.  Given her bradycardia, I recommended discontinuing her timolol, which is the only medication that I  can see which could have been causing her bradycardia. She now presents today as a walk-in with feelings of her heart thumping. She was playing progressive bridge today and she started having heaviness in her chest. She felt like her heart was racing and she was short of breath, dizzy and like she was going to pass out. An EKG was performed in the office today which showed normal sinus rhythm at 71. Blood pressure was normal.  Kelsey James returns today for follow-up. In the interim she is seeing Dr. Jolyn Nap for evaluation of her palpitations and arrhythmia. She was felt to have 2 separate items. The first being palpitations and an ectopic atrial tachycardia with PACs which he felt were most likely benign given her normal coronary anatomy. The second was concerning more for atrial fibrillation, which relates to the episodes where she feels like her chest is "caving in". I discussed multiple ways to try to evaluate for arrhythmias, but given the infrequency of the events, ultimately he recommended an implantable loop recorder. She did undergo successful loop recorder placement and has had 2 transmissions that I reviewed, neither of which have demonstrated any atrial fibrillation. She continues to have some palpitations. She does related somewhat to caffeine and particular chocolate. She reports eating moose tracks ice cream every evening. I did ask that she may want to decrease some of her chocolate intake. Recently she's had problems with hypokalemia. We will plan to recheck a BMET today.  Kelsey James was seen by Dr. Caryl Comes after there was identification of a paroxysmal atrial  tachycardia on her implanted loop recorder. He recommended she start on flecainide 50 mg twice daily. Since starting this medicine she reports feeling horrible. Subsequently she followed up with Chanetta Marshall, NP, who recommended that she discontinue her flecainide. There is been consideration about alternative antiarrhythmics. At this  time she wishes to stay off of anti-rhythmic medication.  I had the pleasure seeing Kelsey James back today in the office. In the end of December she underwent ablation of an inducible atrial tachycardia by Dr. Curt Bears. This appears to been fairly successful at reducing her burden of atrial tachycardia. Although she still has had some breakthroughs. She does have an implantable loop recorder which will be interrogated this month. She has a follow-up appointment with him next week. Her only other concern is that she is having some labile blood pressures. She says she's gained 45 pounds over the holidays and notes her blood pressures a little higher. At times he goes up to 170 but generally ranges between 120 and 140. I do not necessarily see a need to treat that at this time. I asked her to keep records of her blood pressures over the next several weeks.  03/30/2016   Kelsey James returns today for follow-up. Overall she feels like she is doing well. She had as mentioned undergone ablation of an inducible atrial tachycardia by Dr. Curt Bears. She says she still has very infrequent episodes about 2 or 3 times a month of palpitations.  Blood pressure has again been elevated and in the past she had been on amlodipine. She's had some labile blood pressures as evidenced by a recent ophthalmology visit were blood pressure was 161/90.   However blood pressure today is normal at 128/70. There may be an anxiety component to this. She was placed on carvedilol which she felt very bad on and did not want to take beta blockers. She was then given diltiazem and HCTZ. She feels that the diltiazem is not helpful enough for her blood pressure.  We did discuss some alternatives. In the past she been on amlodipine however that is not likely to give her any benefit with her palpitations. Another option would be an older calcium channel blocker such as verapamil , although more likely to have side effects such as constipation and  swelling.  05/12/2016  Kelsey James returns today for follow-up. In the interim she saw Erasmo Downer our anticoagulation and hypertension pharmacist. Medications adjustments were made and she was taken off of diltiazem and is currently on amlodipine 5 mg daily. Blood pressure today was excellent 112/70. I checked her blood pressure with her home cuff which read 130/80. I then repeated her blood pressure which was 110/70 manual. This suggests her blood pressure cuff may be about 15-20 points higher than it should be. I advised that she may wish to get a new blood pressure cuff. She is interested in getting her loop recorder explant it as she's had no further fast palpitations after atrial tachycardia ablation.  02/07/2017  Seanne returns today for follow-up. She seems to be doing well on Xarelto for anticoagulation. She had recently seen Dr. Curt Bears and has a follow-up with him in May. She has an implanted loop recorder and is interested in having that removed. Overall she is without complaints. She is not on any AV nodal blocker but remains in sinus rhythm at 63 with PACs today.  07/18/2017  Mrs. Deschler was seen in follow-up today. Xarelto and has not had bleeding difficulty. She occasionally gets palpitations however  this may be related to anxiety. She has an implanted loop recorder and sees Dr.  Curt Bears in November to have it removed. Blood pressure is at goal today.  PMHx:  Past Medical History:  Diagnosis Date  . A-fib ([place])   . Arthritis    "hands" (11/04/2015)  . Dry eyes   . Glaucoma   . Osteopenia   . Rosacea   . Wears glasses     Past Surgical History:  Procedure Laterality Date  . ATRIAL TACH ABLATION  11/04/2015  . COLONOSCOPY    . ELECTROPHYSIOLOGIC STUDY N/A 11/04/2015   Procedure: A-Tach Ablation;  Surgeon: Will [name], MD;  Location: [facility];  Service: Cardiovascular;  Laterality: N/A;  . LEFT HEART CATHETERIZATION WITH CORONARY ANGIOGRAM N/A 03/12/2014    Procedure: LEFT HEART CATHETERIZATION WITH CORONARY ANGIOGRAM;  Surgeon: Pixie Casino, MD;  Location: [facility] C[facility];  Service: Cardiovascular;  Laterality: N/A;  . LOOP RECORDER IMPLANT N/A 09/15/2014   Procedure: LOOP RECORDER IMPLANT;  Surgeon: Deboraha Sprang, MD;  Location: [facility] CATH LAB;  Service: Cardiovascular;  Laterality: N/A;  . MASS EXCISION Right 09/25/2014   Procedure: RIGHT RING FINGER EXCISION MASS AND DEBRIDEMENT DIP JOINT;  Surgeon: Leanora Cover, MD;  Location: [place];  Service: Orthopedics;  Laterality: Right;  . TONSILLECTOMY AND ADENOIDECTOMY  1979  . TUBAL LIGATION      FAMHx:  Family History  Problem Relation Age of Onset  . Valvular heart disease Mother   . Depression Father   . Cancer Maternal Grandmother     SOCHx:   reports that she quit smoking about 14 years ago. Her smoking use included Cigarettes. She has a 12.50 pack-year smoking history. She has never used smokeless tobacco. She reports that she drinks about 4.2 oz of alcohol per week . She reports that she does not use drugs.  ALLERGIES:  Allergies  Allergen Reactions  . Latex Hives    Cracks in skin  . Other Nausea And Vomiting    Mushrooms   . Shellfish Allergy Nausea And Vomiting    ROS: Pertinent items noted in HPI and remainder of comprehensive ROS otherwise negative.  HOME MEDS: Current Outpatient Prescriptions  Medication Sig Dispense Refill  . acetaminophen (TYLENOL) 500 MG tablet Take 500-1,000 mg by mouth every 6 (six) hours as needed (pain).    Marland Kitchen ALPRAZolam (XANAX) 0.25 MG tablet TAKE 1 TABLET BY MOUTH AT BEDTIME AS NEEDED FOR ANXIETY 30 tablet 1  . amLODipine (NORVASC) 5 MG tablet TAKE 1 TABLET (5 MG TOTAL) BY MOUTH DAILY. 90 tablet 0  . Biotin 1000 MCG tablet Take 1,000 mcg by mouth daily.    . Calcium-Magnesium (CALCIUM MAGNESIUM 750) 300-300 MG TABS Take 1 tablet by mouth 2 (two) times daily.     . Cholecalciferol (VITAMIN D-1000 MAX ST) 1000 units tablet Take  2,000 Units by mouth daily.    . hydroxypropyl methylcellulose (ISOPTO TEARS) 2.5 % ophthalmic solution Place 1 drop into both eyes as needed for dry eyes.    Marland Kitchen MAGNESIUM PO Take 1 tablet by mouth 2 (two) times daily.     . Omega-3 Fatty Acids (FISH OIL PO) Take 1 capsule by mouth 2 (two) times daily.     Marland Kitchen OVER THE COUNTER MEDICATION Take 1 capsule by mouth 2 (two) times daily. Vegetal Silica    . OVER THE COUNTER MEDICATION Take 1 capsule by mouth 2 (two) times daily. Perfect Eyes supplement    .  travoprost, benzalkonium, (TRAVATAN) 0.004 % ophthalmic solution 1 drop at bedtime.    Alveda Reasons 20 MG TABS tablet Take 1 tablet by mouth daily.     No current facility-administered medications for this visit.     LABS/IMAGING: No results found for this or any previous visit (from the past 48 hour(s)). No results found.  VITALS: BP 121/75 (BP Location: Right Arm)   Pulse (!) 59   Ht 5\' 3"  (1.6 m)   Wt 120 lb 3.2 oz (54.5 kg)   BMI 21.29 kg/m   EXAM: General appearance: alert and no distress Lungs: clear to auscultation bilaterally Heart: regular rate and rhythm, S1, S2 normal, no murmur, click, rub or gallop Extremities: extremities normal, atraumatic, no cyanosis or edema Neurologic: Grossly normal  EKG: Junctional rhythm at 59 -  Personally reviewed  ASSESSMENT: 1. Labile hypertension - improved 2. Palpitations - resolved (s/p loop recorder still implanted) 3. Ectopic atrial tachycardia status post ablation 4. New onset atrial fibrillation - now on Xarelto, CHADSVASC score of 3  PLAN: 1.   Mrs. Curless is doing well on Xarelto - no significant palpitations. Mostly anxiety during her bridge tournaments. Blood pressure has been pretty stable.  Follow-up with me annually or sooner as necessary.  Pixie Casino, MD, [facility] Attending Cardiologist [place] 07/18/2017, 2:32 PM

## 2017-07-18 NOTE — Patient Instructions (Addendum)
Your physician wants you to follow-up in: 12 months with Dr. Hilty. You will receive a reminder letter in the mail two months in advance. If you don't receive a letter, please call our office to schedule the follow-up appointment.  

## 2017-08-14 DIAGNOSIS — H4053X3 Glaucoma secondary to other eye disorders, bilateral, severe stage: Secondary | ICD-10-CM | POA: Diagnosis not present

## 2017-09-06 ENCOUNTER — Ambulatory Visit (INDEPENDENT_AMBULATORY_CARE_PROVIDER_SITE_OTHER): Payer: Self-pay | Admitting: Orthopaedic Surgery

## 2017-09-19 ENCOUNTER — Ambulatory Visit (INDEPENDENT_AMBULATORY_CARE_PROVIDER_SITE_OTHER): Payer: Medicare Other | Admitting: Cardiology

## 2017-09-19 ENCOUNTER — Other Ambulatory Visit: Payer: Self-pay | Admitting: Cardiology

## 2017-09-19 ENCOUNTER — Encounter: Payer: Self-pay | Admitting: Cardiology

## 2017-09-19 VITALS — BP 126/80 | HR 62 | Ht 63.5 in | Wt 122.6 lb

## 2017-09-19 DIAGNOSIS — I10 Essential (primary) hypertension: Secondary | ICD-10-CM

## 2017-09-19 DIAGNOSIS — I471 Supraventricular tachycardia: Secondary | ICD-10-CM | POA: Diagnosis not present

## 2017-09-19 DIAGNOSIS — I480 Paroxysmal atrial fibrillation: Secondary | ICD-10-CM | POA: Diagnosis not present

## 2017-09-19 MED ORDER — DILTIAZEM HCL ER COATED BEADS 180 MG PO CP24: 180.0000 mg | ORAL_CAPSULE | Freq: Every day | ORAL | 3 refills | Status: DC

## 2017-09-19 NOTE — Patient Instructions (Addendum)
Medication Instructions:  Your physician has recommended you make the following change in your medication:   1.)  Start Diltiazem 180 mg daily   -- If you need a refill on your cardiac medications before your next appointment, please call your pharmacy. --  Labwork: None ordered  Testing/Procedures:  Your physicians recommends that you have the LINQ removed on 11/13.  Please arrive @ 10:00 am At [facility], Entrance A     Follow-Up:  7-10 Days from 11/13 for wound check in device clinic  Your physician recommends that you schedule a follow-up appointment in: 6 months with Dr. Curt Bears.   Thank you for choosing [facility]!!   Frederik Schmidt, RN [phone]  Any Other Special Instructions Will Be Listed Below (If Applicable).

## 2017-09-19 NOTE — Progress Notes (Signed)
Electrophysiology Office Note   Date:  09/19/2017   ID:  MACEY WURTZ, DOB 1941/06/28, MRN [id number]  PCP:  Dewayne Shorter, PA-C  Cardiologist:  Debara Pickett Primary Electrophysiologist:  Jarmaine Ehrler [name], MD    Chief Complaint  Patient presents with  . Pacemaker Check    Atrial tachycardia/PAF     History of Present Illness: BRAIDYN PEACE is a 76 y.o. female who presents today for electrophysiology evaluation.  She has history of atrial tachycardia status post ablation on 01/04/15. Found to have atrial fibrillation on her Linq monitor on 08/29/16.   Today, denies symptoms of chest pain, shortness of breath, orthopnea, PND, lower extremity edema, claudication, dizziness, presyncope, syncope, bleeding, or neurologic sequela. The patient is tolerating medications without difficulties. She continues to have mild episodes of palpitations. They mainly occur when she is lying flat. They do not occur when she is up and exercising or exerting herself.   Past Medical History:  Diagnosis Date  . A-fib ([place])   . Arthritis    "hands" (11/04/2015)  . Dry eyes   . Glaucoma   . Osteopenia   . Rosacea   . Wears glasses    Past Surgical History:  Procedure Laterality Date  . ATRIAL TACH ABLATION  11/04/2015  . COLONOSCOPY    . TONSILLECTOMY AND ADENOIDECTOMY  1979  . TUBAL LIGATION       Current Outpatient Medications  Medication Sig Dispense Refill  . acetaminophen (TYLENOL) 500 MG tablet Take 500-1,000 mg by mouth every 6 (six) hours as needed (pain).    Marland Kitchen ALPRAZolam (XANAX) 0.25 MG tablet TAKE 1 TABLET BY MOUTH AT BEDTIME AS NEEDED FOR ANXIETY 30 tablet 1  . amLODipine (NORVASC) 5 MG tablet TAKE 1 TABLET (5 MG TOTAL) BY MOUTH DAILY. 90 tablet 0  . Biotin 1000 MCG tablet Take 1,000 mcg by mouth daily.    . Calcium-Magnesium (CALCIUM MAGNESIUM 750) 300-300 MG TABS Take 1 tablet by mouth 2 (two) times daily.     . Cholecalciferol (VITAMIN D-1000 MAX ST) 1000 units tablet Take  2,000 Units by mouth daily.    . hydroxypropyl methylcellulose (ISOPTO TEARS) 2.5 % ophthalmic solution Place 1 drop into both eyes as needed for dry eyes.    Marland Kitchen MAGNESIUM PO Take 1 tablet by mouth 2 (two) times daily.     . Omega-3 Fatty Acids (FISH OIL PO) Take 1 capsule by mouth 2 (two) times daily.     Marland Kitchen OVER THE COUNTER MEDICATION Take 1 capsule by mouth 2 (two) times daily. Vegetal Silica    . OVER THE COUNTER MEDICATION Take 1 capsule by mouth 2 (two) times daily. Perfect Eyes supplement    . travoprost, benzalkonium, (TRAVATAN) 0.004 % ophthalmic solution 1 drop at bedtime.    Alveda Reasons 20 MG TABS tablet Take 1 tablet by mouth daily.     No current facility-administered medications for this visit.     Allergies:   Latex; Other; and Shellfish allergy   Social History:  The patient  reports that she quit smoking about 14 years ago. Her smoking use included cigarettes. She has a 12.50 pack-year smoking history. she has never used smokeless tobacco. She reports that she drinks about 4.2 oz of alcohol per week. She reports that she does not use drugs.   Family History:  The patient's family history includes Cancer in her maternal grandmother; Depression in her father; Valvular heart disease in her mother.   ROS:  Please see the  history of present illness.   Otherwise, review of systems is positive for shortness of breath, palpitations or dizziness, easy bruising.   All other systems are reviewed and negative.   PHYSICAL EXAM: VS:  BP 126/80   Pulse 62   Ht 5' 3.5" (1.613 m)   Wt 122 lb 9.6 oz (55.6 kg)   BMI 21.38 kg/m  , BMI Body mass index is 21.38 kg/m. GEN: Well nourished, well developed, in no acute distress  HEENT: normal  Neck: no JVD, carotid bruits, or masses Cardiac: RRR; no murmurs, rubs, or gallops,no edema  Respiratory:  clear to auscultation bilaterally, normal work of breathing GI: soft, nontender, nondistended, + BS MS: no deformity or atrophy  Skin: warm and dry,  device site well healed Neuro:  Strength and sensation are intact Psych: euthymic mood, full affect  EKG:  EKG is not ordered today. Personal review of the ekg ordered 07/18/17 shows sinus rhythm, rate 59   Recent Labs: 09/22/2016: BUN 11; Creat 0.57; Potassium 3.9; Sodium 139    Lipid Panel  No results found for: CHOL, TRIG, HDL, CHOLHDL, VLDL, LDLCALC, LDLDIRECT   Wt Readings from Last 3 Encounters:  09/19/17 122 lb 9.6 oz (55.6 kg)  07/18/17 120 lb 3.2 oz (54.5 kg)  04/25/17 119 lb 12.8 oz (54.3 kg)    ASSESSMENT AND PLAN:  1.  Atrial tachycardia: Status post ablation at approximately 1:00 on the tricuspid valve. Linq monitor does not show any further atrial tachycardias. She is having episodes of palpitations, though. We'll plan to start diltiazem 180 mg.  2. Hypertension: Well-controlled today  3.  Paroxysmal atrial fibrillation: Currently on Xarelto. Linq monitor shows short episodes of atrial fibrillation. Her Linq is at [facility]. We'll plan for extraction.  This patients CHA2DS2-VASc Score and unadjusted Ischemic Stroke Rate (% per year) is equal to 3.2 % stroke rate/year from a score of 3  Above score calculated as 1 point each if present [CHF, HTN, DM, Vascular=MI/PAD/Aortic Plaque, Age if 65-74, or Female] Above score calculated as 2 points each if present [Age > 75, or Stroke/TIA/TE]  Current medicines are reviewed at length with the patient today.   The patient does not have concerns regarding her medicines.  The following changes were made today: none  Labs/ tests ordered today include:   No orders of the defined types were placed in this encounter.    Disposition:   FU with Jerolyn Flenniken 6 months  Signed, Rober Skeels [name], MD  09/19/2017 3:15 PM     [address] [id number] [phone] (office) [phone] (fax)

## 2017-09-25 ENCOUNTER — Telehealth: Payer: Self-pay | Admitting: Internal Medicine

## 2017-09-25 NOTE — Telephone Encounter (Signed)
Returned the call to the patient. According to her last office visit with Dr. Curt Bears, the patient should be on Diltiazem 180 mg daily and Amlodipine 5 mg. She verbalized her understanding.

## 2017-09-25 NOTE — Telephone Encounter (Signed)
New Message  Pt c/o medication issue:  1. Name of Medication: Amlodipine and dilltiazem  2. How are you currently taking this medication (dosage and times per day)? 5mg  and   3. Are you having a reaction (difficulty breathing--STAT)? no  4. What is your medication issue? Pt would like to know if she needs to be taking both of the medications. Please call back to discuss

## 2017-09-26 ENCOUNTER — Ambulatory Visit (HOSPITAL_COMMUNITY)
Admission: RE | Admit: 2017-09-26 | Discharge: 2017-09-26 | Disposition: A | Discharge status: Home / Self Care | Payer: Medicare Other | Source: Ambulatory Visit | Attending: Cardiology | Admitting: Cardiology

## 2017-09-26 ENCOUNTER — Encounter (HOSPITAL_COMMUNITY)
Admission: RE | Disposition: A | Discharge status: Home / Self Care | Payer: Self-pay | Source: Ambulatory Visit | Attending: Cardiology

## 2017-09-26 ENCOUNTER — Encounter (HOSPITAL_COMMUNITY): Payer: Self-pay | Admitting: Cardiology

## 2017-09-26 DIAGNOSIS — I471 Supraventricular tachycardia: Secondary | ICD-10-CM | POA: Insufficient documentation

## 2017-09-26 DIAGNOSIS — Z8673 Personal history of transient ischemic attack (TIA), and cerebral infarction without residual deficits: Secondary | ICD-10-CM | POA: Diagnosis not present

## 2017-09-26 DIAGNOSIS — I4891 Unspecified atrial fibrillation: Secondary | ICD-10-CM | POA: Insufficient documentation

## 2017-09-26 DIAGNOSIS — Z4509 Encounter for adjustment and management of other cardiac device: Secondary | ICD-10-CM | POA: Diagnosis not present

## 2017-09-26 HISTORY — PX: LOOP RECORDER REMOVAL: EP1215

## 2017-09-26 SURGERY — LOOP RECORDER REMOVAL

## 2017-09-26 MED ORDER — LIDOCAINE-EPINEPHRINE 1 %-1:100000 IJ SOLN
INTRAMUSCULAR | Status: DC | PRN
  Administered 2017-09-26: 20 mL via INTRADERMAL

## 2017-09-26 MED ORDER — LIDOCAINE-EPINEPHRINE 1 %-1:100000 IJ SOLN
INTRAMUSCULAR | Status: AC
  Filled 2017-09-26: qty 1

## 2017-09-26 SURGICAL SUPPLY — 1 items: PACK LOOP INSERTION (CUSTOM PROCEDURE TRAY) ×3 IMPLANT

## 2017-09-26 NOTE — H&P (Signed)
Kelsey James is a 76 y.o. female with a history of atrial fibrillation and atrial tachycardia. She had a LINQ implanted for management of her arrhythmia. Her LINQ reached ERI and thus she presents for explant. On exam, RRR, no murmurs, lungs clear. Risks and benefits discussed. Risks include but not limited to bleeding and infection. She understands the risks and has agreed to the procedure.  Ormand Senn Curt Bears, MD 09/26/2017 10:13 AM

## 2017-09-26 NOTE — Progress Notes (Signed)
Pt verbalized understanding to keep LINQ explant site dressing dry and intact for 48 hours. Verbalized signs/symptoms of infection and knew when follow up appt was. VSS. Pt condition stable at time of discharge home.

## 2017-10-09 DIAGNOSIS — B399 Histoplasmosis, unspecified: Secondary | ICD-10-CM | POA: Diagnosis not present

## 2017-10-09 DIAGNOSIS — Z9842 Cataract extraction status, left eye: Secondary | ICD-10-CM | POA: Diagnosis not present

## 2017-10-09 DIAGNOSIS — Z961 Presence of intraocular lens: Secondary | ICD-10-CM | POA: Diagnosis not present

## 2017-10-09 DIAGNOSIS — H04123 Dry eye syndrome of bilateral lacrimal glands: Secondary | ICD-10-CM | POA: Diagnosis not present

## 2017-10-09 DIAGNOSIS — Z9841 Cataract extraction status, right eye: Secondary | ICD-10-CM | POA: Diagnosis not present

## 2017-10-09 DIAGNOSIS — H4053X3 Glaucoma secondary to other eye disorders, bilateral, severe stage: Secondary | ICD-10-CM | POA: Diagnosis not present

## 2017-10-09 DIAGNOSIS — H02403 Unspecified ptosis of bilateral eyelids: Secondary | ICD-10-CM | POA: Diagnosis not present

## 2017-10-09 DIAGNOSIS — H32 Chorioretinal disorders in diseases classified elsewhere: Secondary | ICD-10-CM | POA: Diagnosis not present

## 2017-10-09 DIAGNOSIS — H02883 Meibomian gland dysfunction of right eye, unspecified eyelid: Secondary | ICD-10-CM | POA: Diagnosis not present

## 2017-10-10 ENCOUNTER — Ambulatory Visit (INDEPENDENT_AMBULATORY_CARE_PROVIDER_SITE_OTHER): Payer: Self-pay | Admitting: *Deleted

## 2017-10-10 DIAGNOSIS — I480 Paroxysmal atrial fibrillation: Secondary | ICD-10-CM

## 2017-10-10 NOTE — Progress Notes (Signed)
Loop wound explant check in clinic, wound well healed, incision edges approximated no redness.

## 2017-10-16 ENCOUNTER — Other Ambulatory Visit: Payer: Self-pay | Admitting: Cardiology

## 2017-10-24 ENCOUNTER — Ambulatory Visit: Payer: Medicare Other

## 2017-11-01 ENCOUNTER — Other Ambulatory Visit: Payer: Self-pay | Admitting: Internal Medicine

## 2017-11-13 DIAGNOSIS — H4053X3 Glaucoma secondary to other eye disorders, bilateral, severe stage: Secondary | ICD-10-CM | POA: Diagnosis not present

## 2017-11-15 ENCOUNTER — Other Ambulatory Visit: Payer: Self-pay | Admitting: Women's Health

## 2017-11-15 DIAGNOSIS — G4709 Other insomnia: Secondary | ICD-10-CM

## 2017-11-15 NOTE — Telephone Encounter (Signed)
Ok for refill? 

## 2017-11-16 NOTE — Telephone Encounter (Signed)
Called into pharmacy

## 2017-11-30 ENCOUNTER — Telehealth: Payer: Self-pay | Admitting: Internal Medicine

## 2017-11-30 NOTE — Telephone Encounter (Signed)
Pease call,blood pressure is consistently high.Please call to advise,pt says she might need to be seen.j

## 2017-11-30 NOTE — Telephone Encounter (Signed)
Patient may take an extra 1/2 tablet of amlodipine (total of 7.5mg  daily) for now, and continue monitoring BP twice daily.   Please schedule f/u with HTN clinic (next available) if patient agrreable. Patient should bring records of BP readings and BP home monitor to appointment.

## 2017-11-30 NOTE — Telephone Encounter (Signed)
Recommendations reviewed with patient  Patient scheduled for appointment next week with Pharm D and will bring blood pressure readings/machine.

## 2017-11-30 NOTE — Telephone Encounter (Signed)
Spoke with patient regarding her blood pressure. Patient checks her blood pressure in the morning prior to medications and has just been getting higher and now getting up in the 190's/80's. After meds coming down to 130's-140's/90's. Yesterday evening SBP in the 190's. She takes Amlodipine 5 mg daily. This morning blood pressure 155/82 and about 1 hour after meds down to 139/81 Will forward to Pharm D for review

## 2017-12-06 ENCOUNTER — Encounter (INDEPENDENT_AMBULATORY_CARE_PROVIDER_SITE_OTHER): Payer: Self-pay

## 2017-12-06 ENCOUNTER — Ambulatory Visit (INDEPENDENT_AMBULATORY_CARE_PROVIDER_SITE_OTHER): Payer: Medicare Other | Admitting: Pharmacist Clinician (PhC)/ Clinical Pharmacy Specialist

## 2017-12-06 ENCOUNTER — Encounter: Payer: Self-pay | Admitting: Pharmacist Clinician (PhC)/ Clinical Pharmacy Specialist

## 2017-12-06 VITALS — BP 136/78 | HR 64

## 2017-12-06 DIAGNOSIS — I10 Essential (primary) hypertension: Secondary | ICD-10-CM

## 2017-12-06 DIAGNOSIS — Z13220 Encounter for screening for lipoid disorders: Secondary | ICD-10-CM

## 2017-12-06 MED ORDER — CHLORTHALIDONE 25 MG PO TABS: 12.5000 mg | ORAL_TABLET | Freq: Every day | ORAL | 3 refills | Status: DC

## 2017-12-06 NOTE — Progress Notes (Signed)
12/06/2017 Kelsey James 1941/05/20 [id number]   HPI:  Kelsey James is a 77 y.o. female patient of Dr Debara Pickett, with a PMH below who presents today for hypertension clinic evaluation.  In addition to hypertension her medical history is significant for paroxysmal AF, atrial tachycardia.   She has been on amlodipine for some time, but noted about 2-3 months ago that her home pressure was starting to climb.  She called in about 2 weeks ago and reported home readings as high at [id number] systolic.  Her amlodipine was increased to 6.5 mg daily and she is in today for follow up.   Blood Pressure Goal:  130/80  Current Medications:  Amlodipine 7.5 mg qd (am), started increased dose on Jan 17  Family Hx:  Mother had heart valve issued, died from this (20)  Sister wth hypertension, obesity  Daughter with developing hypertension - patient says she is in denial about it  Social Hx:  No tobacco, quit in 2004-5; wine 1 glass 1-2 times per week; no caffeine (except chocolate ice cream)  Diet:  80% at home, does not add salt and tries to follow healthy diet  Exercise:  No formal exercise, stays active and doesn't sit still very well  Home BP readings:  Home cuff is Medline, morning average (last 5 days) 143/78; evening average 132/77  Intolerances:   CrCl cannot be calculated (Patient's most recent lab result is older than the maximum 21 days allowed.).  Wt Readings from Last 3 Encounters:  09/26/17 122 lb (55.3 kg)  09/19/17 122 lb 9.6 oz (55.6 kg)  07/18/17 120 lb 3.2 oz (54.5 kg)   BP Readings from Last 3 Encounters:  12/06/17 136/78  09/26/17 130/66  09/19/17 126/80   Pulse Readings from Last 3 Encounters:  12/06/17 64  09/26/17 60  09/19/17 62    Current Outpatient Medications  Medication Sig Dispense Refill  . acetaminophen (TYLENOL) 500 MG tablet Take 500 mg every 6 (six) hours as needed by mouth (pain).     Marland Kitchen ALPRAZolam (XANAX) 0.25 MG tablet TAKE 1 TABLET BY MOUTH  AT BEDTIME AS NEEDED FOR ANXIETY 30 tablet 1  . amLODipine (NORVASC) 5 MG tablet Take 5 mg by mouth as directed. 1 and 1/2 tablet daily    . Biotin 1000 MCG tablet Take 1,000 mcg by mouth daily.    . Calcium-Magnesium (CALCIUM MAGNESIUM 750) 300-300 MG TABS Take 1 tablet by mouth 2 (two) times daily.     . chlorthalidone (HYGROTON) 25 MG tablet Take 0.5 tablets (12.5 mg total) by mouth daily. 15 tablet 3  . Crisaborole (EUCRISA) 2 % OINT Apply 1 application 2 (two) times daily as needed topically (for eye lids).    . hydroxypropyl methylcellulose (ISOPTO TEARS) 2.5 % ophthalmic solution Place 1 drop into both eyes as needed for dry eyes.    Marland Kitchen LECITHIN PO Take 1 capsule 2 (two) times daily by mouth.    . Multiple Vitamins-Minerals (PRESERVISION AREDS 2 PO) Take 1 capsule 2 (two) times daily by mouth.    . Omega-3 Fatty Acids (FISH OIL PO) Take 1 capsule by mouth 2 (two) times daily.     Marland Kitchen OVER THE COUNTER MEDICATION Take 1 capsule 2 (two) times daily by mouth. Florasil Supllement    . OVER THE COUNTER MEDICATION Take 1 capsule 2 (two) times daily by mouth. Bioastin Supplement    . travoprost, benzalkonium, (TRAVATAN) 0.004 % ophthalmic solution Place 1 drop at bedtime into both  eyes.     Marland Kitchen XARELTO 20 MG TABS tablet Take 20 mg daily by mouth.     Alveda Reasons 20 MG TABS tablet TAKE 1 TABLET BY MOUTH EVERY DAY WITH SUPPER 30 tablet 6   No current facility-administered medications for this visit.     Allergies  Allergen Reactions  . Latex Hives and Other (See Comments)    Cracks in skin  . Other Nausea And Vomiting    Mushrooms   . Shellfish Allergy Nausea And Vomiting    Past Medical History:  Diagnosis Date  . A-fib ([place])   . Arthritis    "hands" (11/04/2015)  . Dry eyes   . Glaucoma   . Osteopenia   . Rosacea   . Wears glasses     Blood pressure 136/78, pulse 64.  Standing 118/72  Essential hypertension Patient with essential hypertension currently treated with amlodipine 7.5  mg daily.  Will have her add chlorthalidone 12.5 mg once daily and return in 2 weeks for a BMET.  We will see her back in a month for a follow up blood pressure check.   She also states that she has not had her cholesterol checked in several years and wants to know if she should be concerned.  Will get that lab ordered as well.     Tommy Medal PharmD CPP [facility] [address] [place], [place] [address] [phone]

## 2017-12-06 NOTE — Assessment & Plan Note (Signed)
Patient with essential hypertension currently treated with amlodipine 7.5 mg daily.  Will have her add chlorthalidone 12.5 mg once daily and return in 2 weeks for a BMET.  We will see her back in a month for a follow up blood pressure check.   She also states that she has not had her cholesterol checked in several years and wants to know if she should be concerned.  Will get that lab ordered as well.

## 2017-12-06 NOTE — Patient Instructions (Signed)
Return for a a follow up appointment in 1 month Go to the lab (in our office) in 2 weeks to get blood draw  Your blood pressure today is 136/78  Check your blood pressure at home once or twice daily and keep record of the readings.  Take your BP meds as follows:  Start chlorthalidone 12.5 mg (1/2 tablet) once daily in the mornings  Move amlodipine 7.5 mg (1.5 tablets) to once daily in the evenings  Bring all of your meds, your BP cuff and your record of home blood pressures to your next appointment.  Exercise as you're able, try to walk approximately 30 minutes per day.  Keep salt intake to a minimum, especially watch canned and prepared boxed foods.  Eat more fresh fruits and vegetables and fewer canned items.  Avoid eating in fast food restaurants.    HOW TO TAKE YOUR BLOOD PRESSURE: . Rest 5 minutes before taking your blood pressure. .  Don't smoke or drink caffeinated beverages for at least 30 minutes before. . Take your blood pressure before (not after) you eat. . Sit comfortably with your back supported and both feet on the floor (don't cross your legs). . Elevate your arm to heart level on a table or a desk. . Use the proper sized cuff. It should fit smoothly and snugly around your bare upper arm. There should be enough room to slip a fingertip under the cuff. The bottom edge of the cuff should be 1 inch above the crease of the elbow. . Ideally, take 3 measurements at one sitting and record the average.

## 2017-12-20 LAB — LIPID PANEL
CHOL/HDL RATIO: 2 ratio (ref 0.0–4.4)
Cholesterol, Total: 172 mg/dL (ref 100–199)
HDL: 88 mg/dL (ref >39)
LDL CALC: 70 mg/dL (ref 0–99)
Triglycerides: 68 mg/dL (ref 0–149)
VLDL CHOLESTEROL CAL: 14 mg/dL (ref 5–40)

## 2017-12-22 ENCOUNTER — Telehealth: Payer: Self-pay | Admitting: Internal Medicine

## 2017-12-22 MED ORDER — AMLODIPINE BESYLATE 5 MG PO TABS: 5.0000 mg | ORAL_TABLET | ORAL | 2 refills | Status: DC

## 2017-12-22 NOTE — Telephone Encounter (Signed)
Patient calling stating that she was in on 2/5 for a lab draw. She was supposed to have her BMET and Lipid drawn but only the Lipid was drawn. The lab technician has put a call into the main [place] office and they will try to use the remaining specimen to get the BMET. They will fax a form to the office if this can be completed. The patient has been notified.

## 2017-12-22 NOTE — Telephone Encounter (Signed)
New message     *STAT* If patient is at the pharmacy, call can be transferred to refill team.   1. Which medications need to be refilled? (please list name of each medication and dose if known) amLODipine (NORVASC) 5 MG tablet  2. Which pharmacy/location (including street and city if local pharmacy) is medication to be sent to?[facility] [place]  3. Do they need a 30 day or 90 day supply? [place]

## 2017-12-22 NOTE — Telephone Encounter (Signed)
New message    Patient calling to discuss potassium. Please call

## 2017-12-26 LAB — BASIC METABOLIC PANEL
BUN / CREAT RATIO: 20 (ref 12–28)
BUN: 11 mg/dL (ref 8–27)
CO2: 22 mmol/L (ref 20–29)
Calcium: 9.1 mg/dL (ref 8.7–10.3)
Chloride: 95 mmol/L — ABNORMAL LOW (ref 96–106)
Creatinine, Ser: 0.55 mg/dL — ABNORMAL LOW (ref 0.57–1.00)
GFR calc non Af Amer: 93 mL/min/{1.73_m2} (ref >59)
GFR, EST AFRICAN AMERICAN: 107 mL/min/{1.73_m2} (ref >59)
Glucose: 91 mg/dL (ref 65–99)
Potassium: 4 mmol/L (ref 3.5–5.2)
SODIUM: 134 mmol/L (ref 134–144)

## 2017-12-26 LAB — SPECIMEN STATUS REPORT

## 2018-01-02 ENCOUNTER — Ambulatory Visit (INDEPENDENT_AMBULATORY_CARE_PROVIDER_SITE_OTHER): Payer: Medicare Other | Admitting: Pharmacist

## 2018-01-02 VITALS — BP 106/64 | HR 64

## 2018-01-02 DIAGNOSIS — I10 Essential (primary) hypertension: Secondary | ICD-10-CM | POA: Diagnosis not present

## 2018-01-02 NOTE — Patient Instructions (Addendum)
Return for a  follow up appointment in 6 weeks  Check your blood pressure at home daily (if able) and keep record of the readings.  Take your BP meds as follows:  Morning:  Amlodipine 2.5mg  (1/2 tablet)  Chlorthalidone 12.5mg  (1/2 tablet)  Evening  Amlodipine 5mg  (1 tablet)  Bring all of your meds, your BP cuff and your record of home blood pressures to your next appointment.  Exercise as you're able, try to walk approximately 30 minutes per day.  Keep salt intake to a minimum, especially watch canned and prepared boxed foods.  Eat more fresh fruits and vegetables and fewer canned items.  Avoid eating in fast food restaurants.    HOW TO TAKE YOUR BLOOD PRESSURE: . Rest 5 minutes before taking your blood pressure. .  Don't smoke or drink caffeinated beverages for at least 30 minutes before. . Take your blood pressure before (not after) you eat. . Sit comfortably with your back supported and both feet on the floor (don't cross your legs). . Elevate your arm to heart level on a table or a desk. . Use the proper sized cuff. It should fit smoothly and snugly around your bare upper arm. There should be enough room to slip a fingertip under the cuff. The bottom edge of the cuff should be 1 inch above the crease of the elbow. . Ideally, take 3 measurements at one sitting and record the average.

## 2018-01-02 NOTE — Progress Notes (Signed)
HPI:  Kelsey James is a 77 y.o. female patient of Dr Debara Pickett, with a PMH below who presents today for hypertension clinic follow up.  In addition to hypertension her medical history is significant for paroxysmal AF, atrial tachycardia. Patient reports felling "fat" and bloated. Denies edema, shortness of breath or weight gain. She also denies headaches, chest pain or increased fatigue. Patient  is very anxious and worries about potential for a stroke with BP ~140/90.  Repeat BMET done 2 weeks after initiating chlorthalidone shows stable renal function and electrolytes.  Blood Pressure Goal:  130/80  Current Medications:  Amlodipine 7.5 mg daily  Chlorthalidone 12.5mg  daily  Family Hx:  Mother had heart valve issued, died from this (56)  Sister wth hypertension, obesity  Daughter with developing hypertension - patient says she is in denial about it  Social Hx:  No tobacco, quit in 2004-5; wine 1 glass 1-2 times per week; no caffeine (except chocolate ice cream)  Diet:  80% at home, does not add salt and tries to follow healthy diet  Exercise:  No formal exercise, stays active and doesn't sit still very well  Home BP readings:  9 morning reading in last 2 weeks; average 135/76 (pulse 54-62 bpm)  8 evening readings in last 2 weeks; average 145/81 (pulse 55-71 bpm)  Wt Readings from Last 3 Encounters:  09/26/17 122 lb (55.3 kg)  09/19/17 122 lb 9.6 oz (55.6 kg)  07/18/17 120 lb 3.2 oz (54.5 kg)   BP Readings from Last 3 Encounters:  01/02/18 106/64  12/06/17 136/78  09/26/17 130/66   Pulse Readings from Last 3 Encounters:  01/02/18 64  12/06/17 64  09/26/17 60    Current Outpatient Medications  Medication Sig Dispense Refill  . acetaminophen (TYLENOL) 500 MG tablet Take 500 mg every 6 (six) hours as needed by mouth (pain).     Marland Kitchen ALPRAZolam (XANAX) 0.25 MG tablet TAKE 1 TABLET BY MOUTH AT BEDTIME AS NEEDED FOR ANXIETY 30 tablet 1  . amLODipine (NORVASC) 5 MG tablet  Take 1 tablet (5 mg total) by mouth as directed. 1 and 1/2 tablet daily 135 tablet 2  . Biotin 1000 MCG tablet Take 1,000 mcg by mouth daily.    . Calcium-Magnesium (CALCIUM MAGNESIUM 750) 300-300 MG TABS Take 1 tablet by mouth 2 (two) times daily.     . chlorthalidone (HYGROTON) 25 MG tablet Take 0.5 tablets (12.5 mg total) by mouth daily. 15 tablet 3  . Crisaborole (EUCRISA) 2 % OINT Apply 1 application 2 (two) times daily as needed topically (for eye lids).    . hydroxypropyl methylcellulose (ISOPTO TEARS) 2.5 % ophthalmic solution Place 1 drop into both eyes as needed for dry eyes.    Marland Kitchen LECITHIN PO Take 1 capsule 2 (two) times daily by mouth.    . Multiple Vitamins-Minerals (PRESERVISION AREDS 2 PO) Take 1 capsule 2 (two) times daily by mouth.    . Omega-3 Fatty Acids (FISH OIL PO) Take 1 capsule by mouth 2 (two) times daily.     Marland Kitchen OVER THE COUNTER MEDICATION Take 1 capsule 2 (two) times daily by mouth. Florasil Supllement    . OVER THE COUNTER MEDICATION Take 1 capsule 2 (two) times daily by mouth. Bioastin Supplement    . travoprost, benzalkonium, (TRAVATAN) 0.004 % ophthalmic solution Place 1 drop at bedtime into both eyes.     Alveda Reasons 20 MG TABS tablet TAKE 1 TABLET BY MOUTH EVERY DAY WITH SUPPER 30  tablet 6   No current facility-administered medications for this visit.     Allergies  Allergen Reactions  . Latex Hives and Other (See Comments)    Cracks in skin  . Other Nausea And Vomiting    Mushrooms   . Shellfish Allergy Nausea And Vomiting    Past Medical History:  Diagnosis Date  . A-fib ([place])   . Arthritis    "hands" (11/04/2015)  . Dry eyes   . Glaucoma   . Osteopenia   . Rosacea   . Wears glasses     Blood pressure 106/64, pulse 64, SpO2 98 %.   Essential hypertension Blood pressure is well controlled in clinic. Patient reports BP reading significantly higher at home but denies additional symptoms.  Patient was instructed to bring her BP home monitors to  next office visit for calibration. Will continue amlodipine and chlorthalidone but change administration to : 2.5mg  amlodipine plus 12.5mg  chlorthalidone every morning, and 5mg  amlodipine every evening.  Plan to follow up in 6 weeks at HTN clinic   Eschol Auxier Rodriguez-Guzman PharmD, BCPS, [place] [address] [place] [id number] 01/04/2018 4:41 PM

## 2018-01-04 ENCOUNTER — Encounter: Payer: Self-pay | Admitting: Pharmacist

## 2018-01-04 NOTE — Assessment & Plan Note (Addendum)
Blood pressure is well controlled in clinic. Patient reports BP reading significantly higher at home but denies additional symptoms.  Patient was instructed to bring her BP home monitors to next office visit for calibration. Will continue amlodipine and chlorthalidone but change administration to : 2.5mg  amlodipine plus 12.5mg  chlorthalidone every morning, and 5mg  amlodipine every evening.  Plan to follow up in 6 weeks at HTN clinic

## 2018-01-31 ENCOUNTER — Encounter: Payer: Self-pay | Admitting: Women's Health

## 2018-02-01 ENCOUNTER — Encounter: Payer: Self-pay | Admitting: Women's Health

## 2018-02-06 ENCOUNTER — Encounter: Payer: Self-pay | Admitting: Women's Health

## 2018-02-06 ENCOUNTER — Ambulatory Visit (INDEPENDENT_AMBULATORY_CARE_PROVIDER_SITE_OTHER): Payer: Medicare Other | Admitting: Women's Health

## 2018-02-06 VITALS — BP 110/78 | Ht 63.0 in | Wt 123.0 lb

## 2018-02-06 DIAGNOSIS — G4709 Other insomnia: Secondary | ICD-10-CM | POA: Diagnosis not present

## 2018-02-06 DIAGNOSIS — M8588 Other specified disorders of bone density and structure, other site: Secondary | ICD-10-CM

## 2018-02-06 DIAGNOSIS — Z01419 Encounter for gynecological examination (general) (routine) without abnormal findings: Secondary | ICD-10-CM

## 2018-02-06 DIAGNOSIS — M85859 Other specified disorders of bone density and structure, unspecified thigh: Secondary | ICD-10-CM

## 2018-02-06 MED ORDER — ALPRAZOLAM 0.25 MG PO TABS: ORAL_TABLET | ORAL | 1 refills | Status: DC

## 2018-02-06 NOTE — Patient Instructions (Addendum)
shingrex pneumovac prevnar 13  Start YOGA!!!!  Health Maintenance for Postmenopausal Women Menopause is a normal process in which your reproductive ability comes to an end. This process happens gradually over a span of months to years, usually between the ages of 30 and 15. Menopause is complete when you have missed 12 consecutive menstrual periods. It is important to talk with your health care provider about some of the most common conditions that affect postmenopausal women, such as heart disease, cancer, and bone loss (osteoporosis). Adopting a healthy lifestyle and getting preventive care can help to promote your health and wellness. Those actions can also lower your chances of developing some of these common conditions. What should I know about menopause? During menopause, you may experience a number of symptoms, such as:  Moderate-to-severe hot flashes.  Night sweats.  Decrease in sex drive.  Mood swings.  Headaches.  Tiredness.  Irritability.  Memory problems.  Insomnia.  Choosing to treat or not to treat menopausal changes is an individual decision that you make with your health care provider. What should I know about hormone replacement therapy and supplements? Hormone therapy products are effective for treating symptoms that are associated with menopause, such as hot flashes and night sweats. Hormone replacement carries certain risks, especially as you become older. If you are thinking about using estrogen or estrogen with progestin treatments, discuss the benefits and risks with your health care provider. What should I know about heart disease and stroke? Heart disease, heart attack, and stroke become more likely as you age. This may be due, in part, to the hormonal changes that your body experiences during menopause. These can affect how your body processes dietary fats, triglycerides, and cholesterol. Heart attack and stroke are both medical emergencies. There are many  things that you can do to help prevent heart disease and stroke:  Have your blood pressure checked at least every 1-2 years. High blood pressure causes heart disease and increases the risk of stroke.  If you are 58-19 years old, ask your health care provider if you should take aspirin to prevent a heart attack or a stroke.  Do not use any tobacco products, including cigarettes, chewing tobacco, or electronic cigarettes. If you need help quitting, ask your health care provider.  It is important to eat a healthy diet and maintain a healthy weight. ? Be sure to include plenty of vegetables, fruits, low-fat dairy products, and lean protein. ? Avoid eating foods that are high in solid fats, added sugars, or salt (sodium).  Get regular exercise. This is one of the most important things that you can do for your health. ? Try to exercise for at least 150 minutes each week. The type of exercise that you do should increase your heart rate and make you sweat. This is known as moderate-intensity exercise. ? Try to do strengthening exercises at least twice each week. Do these in addition to the moderate-intensity exercise.  Know your numbers.Ask your health care provider to check your cholesterol and your blood glucose. Continue to have your blood tested as directed by your health care provider.  What should I know about cancer screening? There are several types of cancer. Take the following steps to reduce your risk and to catch any cancer development as early as possible. Breast Cancer  Practice breast self-awareness. ? This means understanding how your breasts normally appear and feel. ? It also means doing regular breast self-exams. Let your health care provider know about any changes, no  matter how small.  If you are 70 or older, have a clinician do a breast exam (clinical breast exam or CBE) every year. Depending on your age, family history, and medical history, it may be recommended that you  also have a yearly breast X-ray (mammogram).  If you have a family history of breast cancer, talk with your health care provider about genetic screening.  If you are at high risk for breast cancer, talk with your health care provider about having an MRI and a mammogram every year.  Breast cancer (BRCA) gene test is recommended for women who have family members with BRCA-related cancers. Results of the assessment will determine the need for genetic counseling and BRCA1 and for BRCA2 testing. BRCA-related cancers include these types: ? Breast. This occurs in males or females. ? Ovarian. ? Tubal. This may also be called fallopian tube cancer. ? Cancer of the abdominal or pelvic lining (peritoneal cancer). ? Prostate. ? Pancreatic.  Cervical, Uterine, and Ovarian Cancer Your health care provider may recommend that you be screened regularly for cancer of the pelvic organs. These include your ovaries, uterus, and vagina. This screening involves a pelvic exam, which includes checking for microscopic changes to the surface of your cervix (Pap test).  For women ages 21-65, health care providers may recommend a pelvic exam and a Pap test every three years. For women ages 5-65, they may recommend the Pap test and pelvic exam, combined with testing for human papilloma virus (HPV), every five years. Some types of HPV increase your risk of cervical cancer. Testing for HPV may also be done on women of any age who have unclear Pap test results.  Other health care providers may not recommend any screening for nonpregnant women who are considered low risk for pelvic cancer and have no symptoms. Ask your health care provider if a screening pelvic exam is right for you.  If you have had past treatment for cervical cancer or a condition that could lead to cancer, you need Pap tests and screening for cancer for at least 20 years after your treatment. If Pap tests have been discontinued for you, your risk factors  (such as having a new sexual partner) need to be reassessed to determine if you should start having screenings again. Some women have medical problems that increase the chance of getting cervical cancer. In these cases, your health care provider may recommend that you have screening and Pap tests more often.  If you have a family history of uterine cancer or ovarian cancer, talk with your health care provider about genetic screening.  If you have vaginal bleeding after reaching menopause, tell your health care provider.  There are currently no reliable tests available to screen for ovarian cancer.  Lung Cancer Lung cancer screening is recommended for adults 34-58 years old who are at high risk for lung cancer because of a history of smoking. A yearly low-dose CT scan of the lungs is recommended if you:  Currently smoke.  Have a history of at least 30 pack-years of smoking and you currently smoke or have quit within the past 15 years. A pack-year is smoking an average of one pack of cigarettes per day for one year.  Yearly screening should:  Continue until it has been 15 years since you quit.  Stop if you develop a health problem that would prevent you from having lung cancer treatment.  Colorectal Cancer  This type of cancer can be detected and can often be prevented.  Routine colorectal cancer screening usually begins at age 64 and continues through age 42.  If you have risk factors for colon cancer, your health care provider may recommend that you be screened at an earlier age.  If you have a family history of colorectal cancer, talk with your health care provider about genetic screening.  Your health care provider may also recommend using home test kits to check for hidden blood in your stool.  A small camera at the end of a tube can be used to examine your colon directly (sigmoidoscopy or colonoscopy). This is done to check for the earliest forms of colorectal cancer.  Direct  examination of the colon should be repeated every 5-10 years until age 57. However, if early forms of precancerous polyps or small growths are found or if you have a family history or genetic risk for colorectal cancer, you may need to be screened more often.  Skin Cancer  Check your skin from head to toe regularly.  Monitor any moles. Be sure to tell your health care provider: ? About any new moles or changes in moles, especially if there is a change in a mole's shape or color. ? If you have a mole that is larger than the size of a pencil eraser.  If any of your family members has a history of skin cancer, especially at a young age, talk with your health care provider about genetic screening.  Always use sunscreen. Apply sunscreen liberally and repeatedly throughout the day.  Whenever you are outside, protect yourself by wearing long sleeves, pants, a wide-brimmed hat, and sunglasses.  What should I know about osteoporosis? Osteoporosis is a condition in which bone destruction happens more quickly than new bone creation. After menopause, you may be at an increased risk for osteoporosis. To help prevent osteoporosis or the bone fractures that can happen because of osteoporosis, the following is recommended:  If you are 9-75 years old, get at least 1,000 mg of calcium and at least 600 mg of vitamin D per day.  If you are older than age 61 but younger than age 61, get at least 1,200 mg of calcium and at least 600 mg of vitamin D per day.  If you are older than age 13, get at least 1,200 mg of calcium and at least 800 mg of vitamin D per day.  Smoking and excessive alcohol intake increase the risk of osteoporosis. Eat foods that are rich in calcium and vitamin D, and do weight-bearing exercises several times each week as directed by your health care provider. What should I know about how menopause affects my mental health? Depression may occur at any age, but it is more common as you become  older. Common symptoms of depression include:  Low or sad mood.  Changes in sleep patterns.  Changes in appetite or eating patterns.  Feeling an overall lack of motivation or enjoyment of activities that you previously enjoyed.  Frequent crying spells.  Talk with your health care provider if you think that you are experiencing depression. What should I know about immunizations? It is important that you get and maintain your immunizations. These include:  Tetanus, diphtheria, and pertussis (Tdap) booster vaccine.  Influenza every year before the flu season begins.  Pneumonia vaccine.  Shingles vaccine.  Your health care provider may also recommend other immunizations. This information is not intended to replace advice given to you by your health care provider. Make sure you discuss any questions you have with  your health care provider. Document Released: 12/23/2005 Document Revised: 05/20/2016 Document Reviewed: 08/04/2015 Elsevier Interactive Patient Education  2018 [employer].

## 2018-02-06 NOTE — Progress Notes (Signed)
Kelsey James 1941-04-01 [id number]    History:    Presents for stent pelvic exam with no complaints.  Normal Pap history 2017/03/09 neg breast biopsy.  Has not had Shingrex or Pneumovax.  Glaucoma, cataract removal 03-09-16 and reports good vision.  Cardiologist manages hypertension and A. fib.  March 09, 2018 T score -1.7  femoral neck,  stable, spine showed improvement.  2014/03/09- colonoscopy. Husband poor health/not sexually active.  Past medical history, past surgical history, family history and social history were all reviewed and documented in the EPIC chart.  Plays duplicate bridge.    Had 2 daughters, one daughter and granddaughter died in a MVA March 09, 2001.  ROS:  A ROS was performed and pertinent positives and negatives are included.  Exam:  Vitals:   02/06/18 1405  BP: 110/78  Weight: 123 lb (55.8 kg)  Height: 5\' 3"  (1.6 m)   Body mass index is 21.79 kg/m.   General appearance:  Normal Thyroid:  Symmetrical, normal in size, without palpable masses or nodularity. Respiratory  Auscultation:  Clear without wheezing or rhonchi Cardiovascular  Auscultation:  Regular rate, without rubs, murmurs or gallops  Edema/varicosities:  Not grossly evident Abdominal  Soft,nontender, without masses, guarding or rebound.  Liver/spleen:  No organomegaly noted  Hernia:  None appreciated  Skin  Inspection:  Grossly normal   Breasts: Examined lying and sitting.     Right: Without masses, retractions, discharge or axillary adenopathy.     Left: Without masses, retractions, discharge or axillary adenopathy. Gentitourinary   Inguinal/mons:  Normal without inguinal adenopathy  External genitalia:  Normal  BUS/Urethra/Skene's glands:  Normal  Vagina: Atrophic  Cervix:  Normal  Uterus:   normal in size, shape and contour.  Midline and mobile  Adnexa/parametria:     Rt: Without masses or tenderness.   Lt: Without masses or tenderness.  Anus and perineum: Normal  Digital rectal exam: Normal sphincter tone without  palpated masses or tenderness  Assessment/Plan:  77 y.o. MWF G2, P1, 1 deceased for breast and  pelvic exam with no complaints.  Postmenopausal/no HRT/no bleeding Hypertension, A. fib-cardiologist manages Osteopenia without elevated FRAX  Plan: SBE's, continue annual screening mammogram, calcium rich foods, vitamin D 1000 daily encouraged.  Reports normal vitamin D at primary care..  Home safety, fall prevention and importance of weightbearing exercise reviewed.  Yoga encouraged.  Pneumovax and Shingrex reviewed and encouraged will discuss with primary care.  Xanax 0.25 as needed at bedtime for insomnia, aware to use sparingly, addictive properties reviewed.    Rake, 4:54 PM 02/06/2018

## 2018-02-08 ENCOUNTER — Encounter: Payer: Self-pay | Admitting: Cardiology

## 2018-02-08 NOTE — Addendum Note (Signed)
Addended by: Alen Blew on: 02/08/2018 04:04 PM   Modules accepted: Level of Service

## 2018-02-13 ENCOUNTER — Ambulatory Visit (INDEPENDENT_AMBULATORY_CARE_PROVIDER_SITE_OTHER): Payer: Medicare Other | Admitting: Pharmacist Clinician (PhC)/ Clinical Pharmacy Specialist

## 2018-02-13 DIAGNOSIS — I10 Essential (primary) hypertension: Secondary | ICD-10-CM | POA: Diagnosis not present

## 2018-02-13 MED ORDER — RIVAROXABAN 20 MG PO TABS: ORAL_TABLET | ORAL | 1 refills | Status: DC

## 2018-02-13 MED ORDER — CHLORTHALIDONE 25 MG PO TABS: 12.5000 mg | ORAL_TABLET | Freq: Every day | ORAL | 3 refills | Status: DC

## 2018-02-13 NOTE — Progress Notes (Signed)
HPI:  Kelsey James is a 77 y.o. female patient of Dr Debara Pickett, with a PMH below who presents today for hypertension clinic follow up.  In addition to hypertension her medical history is significant for paroxysmal AF, atrial tachycardia.  She has continued to monitor her home blood pressures and reports no problems other than the occasional elevated readings.    She reports no chest pain, shortness of breath, headaches or fatigue.   Her metabolic panel has been stable since the addition of chlorthalidone.   Blood Pressure Goal:  130/80  Current Medications:  Amlodipine 7.5 mg daily (2.5 mg in am, 5 mg in pm)  Chlorthalidone 12.5mg  daily  Family Hx:  Mother had heart valve issued, died from this (21)  Sister wth hypertension, obesity  Daughter with developing hypertension - patient says she is in denial about it  Social Hx:  No tobacco, quit in 2004-5; wine 1 glass 1-2 times per week; no caffeine (except chocolate ice cream)  Diet:  80% at home, does not add salt and tries to follow healthy diet  Exercise:  No formal exercise, stays active and doesn't sit still very well  Home BP readings:  [employer]isse[employer] brand machine - brought into office today and read within 10 points of office cuff  20 morning reading in last 3 weeks; average 122/71 (pulse 53-65 bpm)  21 evening readings in last 3 weeks; average 131/74  (pulse 58-73 bpm)  Wt Readings from Last 3 Encounters:  02/06/18 123 lb (55.8 kg)  09/26/17 122 lb (55.3 kg)  09/19/17 122 lb 9.6 oz (55.6 kg)   BP Readings from Last 3 Encounters:  02/13/18 106/62  02/06/18 110/78  01/02/18 106/64   Pulse Readings from Last 3 Encounters:  02/13/18 64  01/02/18 64  12/06/17 64    Current Outpatient Medications  Medication Sig Dispense Refill  . acetaminophen (TYLENOL) 500 MG tablet Take 500 mg every 6 (six) hours as needed by mouth (pain).     Marland Kitchen ALPRAZolam (XANAX) 0.25 MG tablet TAKE 1 TABLET BY MOUTH AT BEDTIME AS NEEDED FOR  ANXIETY 30 tablet 1  . amLODipine (NORVASC) 5 MG tablet Take 1 tablet (5 mg total) by mouth as directed. 1 and 1/2 tablet daily 135 tablet 2  . Biotin 1000 MCG tablet Take 1,000 mcg by mouth daily.    . Calcium-Magnesium (CALCIUM MAGNESIUM 750) 300-300 MG TABS Take 1 tablet by mouth 2 (two) times daily.     . chlorthalidone (HYGROTON) 25 MG tablet Take 0.5 tablets (12.5 mg total) by mouth daily. 45 tablet 3  . Crisaborole (EUCRISA) 2 % OINT Apply 1 application 2 (two) times daily as needed topically (for eye lids).    . hydroxypropyl methylcellulose (ISOPTO TEARS) 2.5 % ophthalmic solution Place 1 drop into both eyes as needed for dry eyes.    Marland Kitchen LECITHIN PO Take 1 capsule 2 (two) times daily by mouth.    . Multiple Vitamins-Minerals (PRESERVISION AREDS 2 PO) Take 1 capsule 2 (two) times daily by mouth.    . Omega-3 Fatty Acids (FISH OIL PO) Take 1 capsule by mouth 2 (two) times daily.     Marland Kitchen OVER THE COUNTER MEDICATION Take 1 capsule 2 (two) times daily by mouth. Florasil Supllement    . OVER THE COUNTER MEDICATION Take 1 capsule 2 (two) times daily by mouth. Bioastin Supplement    . rivaroxaban (XARELTO) 20 MG TABS tablet TAKE 1 TABLET BY MOUTH EVERY DAY WITH SUPPER 90  tablet 1  . travoprost, benzalkonium, (TRAVATAN) 0.004 % ophthalmic solution Place 1 drop at bedtime into both eyes.      No current facility-administered medications for this visit.     Allergies  Allergen Reactions  . Latex Hives and Other (See Comments)    Cracks in skin  . Other Nausea And Vomiting    Mushrooms   . Shellfish Allergy Nausea And Vomiting    Past Medical History:  Diagnosis Date  . A-fib ([place])   . Arthritis    "hands" (11/04/2015)  . Dry eyes   . Glaucoma   . Osteopenia   . Rosacea   . Wears glasses     Blood pressure 106/62, pulse 64, height 5' 4.5" (1.638 m).   Essential hypertension Patient with essential hypertension, now well controlled on current regimen.  She is to continue with this  and monitor home BP 2-4 times per week.  Patient aware to watch for trends averaging > 893 systolic.  We can see her again in the future should she have further problems.   Tommy Medal PharmD CPP [facility] [address] [place] [id number] 02/13/2018 2:21 PM

## 2018-02-13 NOTE — Patient Instructions (Signed)
   Your blood pressure today is 106/62  Check your blood pressure at home several times each week and keep record of the readings.  Take your BP meds as follows:  Continue with your current medications.  Bring all of your meds, your BP cuff and your record of home blood pressures to your next appointment.  Exercise as you're able, try to walk approximately 30 minutes per day.  Keep salt intake to a minimum, especially watch canned and prepared boxed foods.  Eat more fresh fruits and vegetables and fewer canned items.  Avoid eating in fast food restaurants.    HOW TO TAKE YOUR BLOOD PRESSURE: . Rest 5 minutes before taking your blood pressure. .  Don't smoke or drink caffeinated beverages for at least 30 minutes before. . Take your blood pressure before (not after) you eat. . Sit comfortably with your back supported and both feet on the floor (don't cross your legs). . Elevate your arm to heart level on a table or a desk. . Use the proper sized cuff. It should fit smoothly and snugly around your bare upper arm. There should be enough room to slip a fingertip under the cuff. The bottom edge of the cuff should be 1 inch above the crease of the elbow. . Ideally, take 3 measurements at one sitting and record the average.

## 2018-02-13 NOTE — Assessment & Plan Note (Addendum)
Patient with essential hypertension, now well controlled on current regimen.  She is to continue with this and monitor home BP 2-4 times per week.  Patient aware to watch for trends averaging > 791 systolic.  We can see her again in the future should she have further problems.

## 2018-03-02 ENCOUNTER — Encounter: Payer: Self-pay | Admitting: Cardiology

## 2018-03-20 ENCOUNTER — Encounter: Payer: Medicare Other | Admitting: Cardiology

## 2018-04-18 ENCOUNTER — Encounter: Payer: Self-pay | Admitting: Cardiology

## 2018-04-18 ENCOUNTER — Ambulatory Visit (INDEPENDENT_AMBULATORY_CARE_PROVIDER_SITE_OTHER): Payer: Medicare Other | Admitting: Cardiology

## 2018-04-18 VITALS — BP 120/72 | HR 54 | Ht 64.0 in | Wt 122.0 lb

## 2018-04-18 DIAGNOSIS — I471 Supraventricular tachycardia: Secondary | ICD-10-CM | POA: Diagnosis not present

## 2018-04-18 DIAGNOSIS — I480 Paroxysmal atrial fibrillation: Secondary | ICD-10-CM

## 2018-04-18 DIAGNOSIS — I10 Essential (primary) hypertension: Secondary | ICD-10-CM | POA: Diagnosis not present

## 2018-04-18 NOTE — Patient Instructions (Signed)
Medication Instructions:  Your physician recommends that you continue on your current medications as directed. Please refer to the Current Medication list given to you today.  Labwork: None ordered     *We will only notify you of abnormal results, otherwise continue current treatment plan.  Testing/Procedures: None ordered  Follow-Up: Your physician wants you to follow-up in: 1 year with Dr. Camnitz.  You will receive a reminder letter in the mail two months in advance. If you don't receive a letter, please call our office to schedule the follow-up appointment.   * If you need a refill on your cardiac medications before your next appointment, please call your pharmacy.   *Please note that any paperwork needing to be filled out by the provider will need to be addressed at the front desk prior to seeing the provider. Please note that any FMLA, disability or other documents regarding health condition is subject to a $25.00 charge that must be received prior to completion of paperwork in the form of a money order or check.  Thank you for choosing [facility]!!   Jalaysha Skilton, RN [phone]        

## 2018-04-18 NOTE — Progress Notes (Signed)
Electrophysiology Office Note   Date:  04/18/2018   ID:  Kelsey James, DOB Aug 03, 1941, MRN [id number]  PCP:  Dewayne Shorter, PA-C  Cardiologist:  Debara Pickett Primary Electrophysiologist:  Haldon Carley [name], MD    Chief Complaint  Patient presents with  . Pacemaker Check    Atrial tachycardia/PAF     History of Present Illness: Kelsey James is a 77 y.o. female who presents today for electrophysiology evaluation.  She has history of atrial tachycardia status post ablation on 01/04/15. Found to have atrial fibrillation on her Linq monitor on 08/29/16.   Today, denies symptoms of palpitations, chest pain, shortness of breath, orthopnea, PND, lower extremity edema, claudication, dizziness, presyncope, syncope, bleeding, or neurologic sequela. The patient is tolerating medications without difficulties.  She occasionally has palpitations.  These have been short-lived.  They occur once every few weeks.  She does not have chest pain, shortness of breath, weakness, or fatigue that would be related atrial fibrillation.  She has no functional limitations at this time.  Past Medical History:  Diagnosis Date  . A-fib ([place])   . Arthritis    "hands" (11/04/2015)  . Dry eyes   . Glaucoma   . Osteopenia   . Rosacea   . Wears glasses    Past Surgical History:  Procedure Laterality Date  . ATRIAL TACH ABLATION  11/04/2015  . COLONOSCOPY    . ELECTROPHYSIOLOGIC STUDY N/A 11/04/2015   Procedure: A-Tach Ablation;  Surgeon: Semisi Biela [name], MD;  Location: [facility];  Service: Cardiovascular;  Laterality: N/A;  . LEFT HEART CATHETERIZATION WITH CORONARY ANGIOGRAM N/A 03/12/2014   Procedure: LEFT HEART CATHETERIZATION WITH CORONARY ANGIOGRAM;  Surgeon: Pixie Casino, MD;  Location: [facility] CATH LAB;  Service: Cardiovascular;  Laterality: N/A;  . LOOP RECORDER IMPLANT N/A 09/15/2014   Procedure: LOOP RECORDER IMPLANT;  Surgeon: Deboraha Sprang, MD;  Location: [facility] CATH LAB;  Service:  Cardiovascular;  Laterality: N/A;  . LOOP RECORDER REMOVAL N/A 09/26/2017   Procedure: LOOP RECORDER REMOVAL;  Surgeon: Constance Haw, MD;  Location: [facility];  Service: Cardiovascular;  Laterality: N/A;  . MASS EXCISION Right 09/25/2014   Procedure: RIGHT RING FINGER EXCISION MASS AND DEBRIDEMENT DIP JOINT;  Surgeon: Leanora Cover, MD;  Location: [facility];  Service: Orthopedics;  Laterality: Right;  . TONSILLECTOMY AND ADENOIDECTOMY  1979  . TUBAL LIGATION       Current Outpatient Medications  Medication Sig Dispense Refill  . acetaminophen (TYLENOL) 500 MG tablet Take 500 mg every 6 (six) hours as needed by mouth (pain).     Marland Kitchen ALPRAZolam (XANAX) 0.25 MG tablet TAKE 1 TABLET BY MOUTH AT BEDTIME AS NEEDED FOR ANXIETY 30 tablet 1  . amLODipine (NORVASC) 5 MG tablet Take 1 tablet (5 mg total) by mouth as directed. 1 and 1/2 tablet daily 135 tablet 2  . amLODipine (NORVASC) 5 MG tablet Take one-half tablet (2.5 mg) in the mornings and one tablet (5 mg) in the evenings by mouth daily    . Biotin 1000 MCG tablet Take 1,000 mcg by mouth daily.    . Calcium-Magnesium (CALCIUM MAGNESIUM 750) 300-300 MG TABS Take 1 tablet by mouth 2 (two) times daily.     . chlorthalidone (HYGROTON) 25 MG tablet Take 0.5 tablets (12.5 mg total) by mouth daily. 45 tablet 3  . Crisaborole (EUCRISA) 2 % OINT Apply 1 application 2 (two) times daily as needed topically (for eye lids).    Marland Kitchen  hydroxypropyl methylcellulose (ISOPTO TEARS) 2.5 % ophthalmic solution Place 1 drop into both eyes as needed for dry eyes.    Marland Kitchen LECITHIN PO Take 1 capsule 2 (two) times daily by mouth.    . Multiple Vitamins-Minerals (PRESERVISION AREDS 2 PO) Take 1 capsule 2 (two) times daily by mouth.    . Omega-3 Fatty Acids (FISH OIL PO) Take 1 capsule by mouth 2 (two) times daily.     Marland Kitchen OVER THE COUNTER MEDICATION Take 1 capsule 2 (two) times daily by mouth. Florasil Supllement    . OVER THE COUNTER MEDICATION Take 1  capsule 2 (two) times daily by mouth. Bioastin Supplement    . rivaroxaban (XARELTO) 20 MG TABS tablet TAKE 1 TABLET BY MOUTH EVERY DAY WITH SUPPER 90 tablet 1  . travoprost, benzalkonium, (TRAVATAN) 0.004 % ophthalmic solution Place 1 drop at bedtime into both eyes.      No current facility-administered medications for this visit.     Allergies:   Latex; Other; and Shellfish allergy   Social History:  The patient  reports that she quit smoking about 15 years ago. Her smoking use included cigarettes. She has a 12.50 pack-year smoking history. She has never used smokeless tobacco. She reports that she drinks about 4.2 oz of alcohol per week. She reports that she does not use drugs.   Family History:  The patient's family history includes Cancer in her maternal grandmother; Depression in her father; Valvular heart disease in her mother.   ROS:  Please see the history of present illness.   Otherwise, review of systems is positive for easy bruising.   All other systems are reviewed and negative.   PHYSICAL EXAM: VS:  BP 120/72   Pulse (!) 54   Ht 5\' 4"  (1.626 m)   Wt 122 lb (55.3 kg)   SpO2 98%   BMI 20.94 kg/m  , BMI Body mass index is 20.94 kg/m. GEN: Well nourished, well developed, in no acute distress  HEENT: normal  Neck: no JVD, carotid bruits, or masses Cardiac: RRR; no murmurs, rubs, or gallops,no edema  Respiratory:  clear to auscultation bilaterally, normal work of breathing GI: soft, nontender, nondistended, + BS MS: no deformity or atrophy  Skin: warm and dry Neuro:  Strength and sensation are intact Psych: euthymic mood, full affect  EKG:  EKG is ordered today. Personal review of the ekg orde sinus rhythm, short PR d shows, 102 ms   Recent Labs: 12/19/2017: BUN 11; Creatinine, Ser 0.55; Potassium 4.0; Sodium 134    Lipid Panel     Component Value Date/Time   CHOL 172 12/19/2017 1435   TRIG 68 12/19/2017 1435   HDL 88 12/19/2017 1435   CHOLHDL 2.0 12/19/2017  1435   LDLCALC 70 12/19/2017 1435     Wt Readings from Last 3 Encounters:  04/18/18 122 lb (55.3 kg)  02/06/18 123 lb (55.8 kg)  09/26/17 122 lb (55.3 kg)    ASSESSMENT AND PLAN:  1.  Atrial tachycardia: Ablation at 1:00 on the tricuspid valve.  She has had no further tachycardias.  2. Hypertension: Well-controlled today.  3.  Paroxysmal atrial fibrillation: On Xarelto. LINQ extraction 09/26/18.  Is having minor episodes of palpitations that are short-lived.  She does not wish to be on antiarrhythmics.  This patients CHA2DS2-VASc Score and unadjusted Ischemic Stroke Rate (% per year) is equal to 3.2 % stroke rate/year from a score of 3  Above score calculated as 1 point each if present [CHF,  HTN, DM, Vascular=MI/PAD/Aortic Plaque, Age if 57-74, or Female] Above score calculated as 2 points each if present [Age > 75, or Stroke/TIA/TE]  Current medicines are reviewed at length with the patient today.   The patient does not have concerns regarding her medicines.  The following changes were made today: None  Labs/ tests ordered today include:   Orders Placed This Encounter  Procedures  . EKG 12-Lead     Disposition:   FU with Rayshon Albaugh 12 months  Signed, Vernal Hritz [name], MD  04/18/2018 12:09 PM     [address] [place] [address] [phone] (office) [phone] (fax)

## 2018-04-26 ENCOUNTER — Other Ambulatory Visit: Payer: Self-pay | Admitting: Women's Health

## 2018-04-26 DIAGNOSIS — G4709 Other insomnia: Secondary | ICD-10-CM

## 2018-04-26 NOTE — Telephone Encounter (Signed)
Ok for refill? 

## 2018-04-26 NOTE — Telephone Encounter (Signed)
Called into pharmacy

## 2018-08-28 ENCOUNTER — Encounter: Payer: Self-pay | Admitting: Internal Medicine

## 2018-08-28 ENCOUNTER — Ambulatory Visit (INDEPENDENT_AMBULATORY_CARE_PROVIDER_SITE_OTHER): Payer: Medicare Other | Admitting: Internal Medicine

## 2018-08-28 VITALS — BP 108/70 | HR 59 | Ht 64.0 in | Wt 122.6 lb

## 2018-08-28 DIAGNOSIS — I10 Essential (primary) hypertension: Secondary | ICD-10-CM

## 2018-08-28 DIAGNOSIS — I480 Paroxysmal atrial fibrillation: Secondary | ICD-10-CM | POA: Diagnosis not present

## 2018-08-28 NOTE — Patient Instructions (Signed)

## 2018-08-28 NOTE — Progress Notes (Signed)
OFFICE NOTE  Chief Complaint:  No complaints  Primary Care Physician: [facility]  HPI:  Kelsey James is a pleasant 77 year old female with little past medical history. For years she's had very low blood pressure however recently her blood pressure has spiked up into the upper 180's to over 354 systolic. She recently presented to the emergency room and any pain with symptoms such as throbbing in her neck and head with uncontrolled blood pressure. Subsequently she was started on low-dose amlodipine and she's had a nice improvement in her blood pressure. She does however report lower extremity swelling in her feet. She also describes what I believe is chest tightness. She actually says that she feels that her chest is "caving in" or "deflating". There is some associated sporadic shortness of breath but it has improved with her blood pressure medicine. She does do some activity and exercise and reports that her symptoms improve with exercise.  Kelsey James had a recent nuclear stress test which demonstrated reversible distal anteroapical and anterolateral reversible ischemia. EF of 69%.  I interpreted as intermediate risk due to the distal location of the ischemia however is abnormal. She is relatively thin and I did not suspect this is due to attenuation artifact.  Ultimately she was referred for cardiac catheterization.  This was performed by myself and demonstrated normal coronary arteries. Subsequently she been having palpitations and I arranged for a monitor. She wore that monitor her for 2 weeks between 03/28/2014 and 04/11/2014. Total monitoring time was 288 hours and 50 minutes. Lowest heart rate was 41 and average heart rate was 59. She was noted to have PACs, short runs of PSVT up to 14 beats were also noted.  This likely explains her palpitations.  Given her bradycardia, I recommended discontinuing her timolol, which is the only medication that I  can see which could have been causing her bradycardia. She now presents today as a walk-in with feelings of her heart thumping. She was playing progressive bridge today and she started having heaviness in her chest. She felt like her heart was racing and she was short of breath, dizzy and like she was going to pass out. An EKG was performed in the office today which showed normal sinus rhythm at 71. Blood pressure was normal.  Kelsey James returns today for follow-up. In the interim she is seeing Dr. Jolyn Nap for evaluation of her palpitations and arrhythmia. She was felt to have 2 separate items. The first being palpitations and an ectopic atrial tachycardia with PACs which he felt were most likely benign given her normal coronary anatomy. The second was concerning more for atrial fibrillation, which relates to the episodes where she feels like her chest is "caving in". I discussed multiple ways to try to evaluate for arrhythmias, but given the infrequency of the events, ultimately he recommended an implantable loop recorder. She did undergo successful loop recorder placement and has had 2 transmissions that I reviewed, neither of which have demonstrated any atrial fibrillation. She continues to have some palpitations. She does related somewhat to caffeine and particular chocolate. She reports eating moose tracks ice cream every evening. I did ask that she may want to decrease some of her chocolate intake. Recently she's had problems with hypokalemia. We will plan to recheck a BMET today.  Kelsey James was seen by Dr. Caryl Comes after there was identification of a paroxysmal atrial tachycardia on her implanted  loop recorder. He recommended she start on flecainide 50 mg twice daily. Since starting this medicine she reports feeling horrible. Subsequently she followed up with Chanetta Marshall, NP, who recommended that she discontinue her flecainide. There is been consideration about alternative antiarrhythmics. At this  time she wishes to stay off of anti-rhythmic medication.  I had the pleasure seeing Kelsey James back today in the office. In the end of December she underwent ablation of an inducible atrial tachycardia by Dr. Curt Bears. This appears to been fairly successful at reducing her burden of atrial tachycardia. Although she still has had some breakthroughs. She does have an implantable loop recorder which will be interrogated this month. She has a follow-up appointment with him next week. Her only other concern is that she is having some labile blood pressures. She says she's gained 45 pounds over the holidays and notes her blood pressures a little higher. At times he goes up to 170 but generally ranges between 120 and 140. I do not necessarily see a need to treat that at this time. I asked her to keep records of her blood pressures over the next several weeks.  03/30/2016   Kelsey James returns today for follow-up. Overall she feels like she is doing well. She had as mentioned undergone ablation of an inducible atrial tachycardia by Dr. Curt Bears. She says she still has very infrequent episodes about 2 or 3 times a month of palpitations.  Blood pressure has again been elevated and in the past she had been on amlodipine. She's had some labile blood pressures as evidenced by a recent ophthalmology visit were blood pressure was 161/90.   However blood pressure today is normal at 128/70. There may be an anxiety component to this. She was placed on carvedilol which she felt very bad on and did not want to take beta blockers. She was then given diltiazem and HCTZ. She feels that the diltiazem is not helpful enough for her blood pressure.  We did discuss some alternatives. In the past she been on amlodipine however that is not likely to give her any benefit with her palpitations. Another option would be an older calcium channel blocker such as verapamil , although more likely to have side effects such as constipation and  swelling.  05/12/2016  Kelsey James returns today for follow-up. In the interim she saw Erasmo Downer our anticoagulation and hypertension pharmacist. Medications adjustments were made and she was taken off of diltiazem and is currently on amlodipine 5 mg daily. Blood pressure today was excellent 112/70. I checked her blood pressure with her home cuff which read 130/80. I then repeated her blood pressure which was 110/70 manual. This suggests her blood pressure cuff may be about 15-20 points higher than it should be. I advised that she may wish to get a new blood pressure cuff. She is interested in getting her loop recorder explant it as she's had no further fast palpitations after atrial tachycardia ablation.  02/07/2017  Kelsey James returns today for follow-up. She seems to be doing well on Xarelto for anticoagulation. She had recently seen Dr. Curt Bears and has a follow-up with him in May. She has an implanted loop recorder and is interested in having that removed. Overall she is without complaints. She is not on any AV nodal blocker but remains in sinus rhythm at 63 with PACs today.  07/18/2017  Kelsey James was seen in follow-up today. Xarelto and has not had bleeding difficulty. She occasionally gets palpitations however this may be related  to anxiety. She has an implanted loop recorder and sees Dr.  Curt Bears in November to have it removed. Blood pressure is at goal today.  08/28/2018  Kelsey James returns today for follow-up.  She is doing well without any recurrent palpitations of significance.  Her loop recorder was explanted in 2018.  She was found to have A. fib and is doing well on Xarelto.  No bleeding issues.  PMHx:  Past Medical History:  Diagnosis Date  . A-fib ([place])   . Arthritis    "hands" (11/04/2015)  . Dry eyes   . Glaucoma   . Osteopenia   . Rosacea   . Wears glasses     Past Surgical History:  Procedure Laterality Date  . ATRIAL TACH ABLATION  11/04/2015  . COLONOSCOPY    .  ELECTROPHYSIOLOGIC STUDY N/A 11/04/2015   Procedure: A-Tach Ablation;  Surgeon: Will [name], MD;  Location: [facility];  Service: Cardiovascular;  Laterality: N/A;  . LEFT HEART CATHETERIZATION WITH CORONARY ANGIOGRAM N/A 03/12/2014   Procedure: LEFT HEART CATHETERIZATION WITH CORONARY ANGIOGRAM;  Surgeon: Pixie Casino, MD;  Location: [facility] CATH LAB;  Service: Cardiovascular;  Laterality: N/A;  . LOOP RECORDER IMPLANT N/A 09/15/2014   Procedure: LOOP RECORDER IMPLANT;  Surgeon: Deboraha Sprang, MD;  Location: [facility] CATH LAB;  Service: Cardiovascular;  Laterality: N/A;  . LOOP RECORDER REMOVAL N/A 09/26/2017   Procedure: LOOP RECORDER REMOVAL;  Surgeon: Constance Haw, MD;  Location: [facility];  Service: Cardiovascular;  Laterality: N/A;  . MASS EXCISION Right 09/25/2014   Procedure: RIGHT RING FINGER EXCISION MASS AND DEBRIDEMENT DIP JOINT;  Surgeon: Leanora Cover, MD;  Location: [facility];  Service: Orthopedics;  Laterality: Right;  . TONSILLECTOMY AND ADENOIDECTOMY  1979  . TUBAL LIGATION      FAMHx:  Family History  Problem Relation Age of Onset  . Valvular heart disease Mother   . Depression Father   . Cancer Maternal Grandmother     SOCHx:   reports that she quit smoking about 15 years ago. Her smoking use included cigarettes. She has a 12.50 pack-year smoking history. She has never used smokeless tobacco. She reports that she drinks about 7.0 standard drinks of alcohol per week. She reports that she does not use drugs.  ALLERGIES:  Allergies  Allergen Reactions  . Other Nausea And Vomiting    Mushrooms   . Shellfish Allergy Nausea And Vomiting    ROS: Pertinent items noted in HPI and remainder of comprehensive ROS otherwise negative.  HOME MEDS: Current Outpatient Medications  Medication Sig Dispense Refill  . acetaminophen (TYLENOL) 500 MG tablet Take 500 mg every 6 (six) hours as needed by mouth (pain).     Marland Kitchen ALPRAZolam (XANAX)  0.25 MG tablet TAKE 1 TABLET BY MOUTH AT BEDTIME AS NEEDED FOR ANXIETY 30 tablet 1  . amLODipine (NORVASC) 5 MG tablet Take one-half tablet (2.5 mg) in the mornings and one tablet (5 mg) in the evenings by mouth daily    . Biotin 1000 MCG tablet Take 1,000 mcg by mouth daily.    . Calcium-Magnesium (CALCIUM MAGNESIUM 750) 300-300 MG TABS Take 1 tablet by mouth 2 (two) times daily.     . chlorthalidone (HYGROTON) 25 MG tablet Take 0.5 tablets (12.5 mg total) by mouth daily. 45 tablet 3  . hydroxypropyl methylcellulose (ISOPTO TEARS) 2.5 % ophthalmic solution Place 1 drop into both eyes as needed for dry eyes.    Marland Kitchen  LECITHIN PO Take 1 capsule 2 (two) times daily by mouth.    . Omega-3 Fatty Acids (FISH OIL PO) Take 1 capsule by mouth 2 (two) times daily.     Marland Kitchen OVER THE COUNTER MEDICATION Take 1 capsule 2 (two) times daily by mouth. Florasil Supllement    . OVER THE COUNTER MEDICATION Take 1 capsule 2 (two) times daily by mouth. Bioastin Supplement    . rivaroxaban (XARELTO) 20 MG TABS tablet TAKE 1 TABLET BY MOUTH EVERY DAY WITH SUPPER 90 tablet 1  . travoprost, benzalkonium, (TRAVATAN) 0.004 % ophthalmic solution Place 1 drop at bedtime into both eyes.      No current facility-administered medications for this visit.     LABS/IMAGING: No results found for this or any previous visit (from the past 48 hour(s)). No results found.  VITALS: BP 108/70   Pulse (!) 59   Ht 5\' 4"  (1.626 m)   Wt 122 lb 9.6 oz (55.6 kg)   BMI 21.04 kg/m   EXAM: General appearance: alert and no distress Neck: no carotid bruit, no JVD and thyroid not enlarged, symmetric, no tenderness/mass/nodules Lungs: clear to auscultation bilaterally Heart: regular rate and rhythm, S1, S2 normal, no murmur, click, rub or gallop Abdomen: soft, non-tender; bowel sounds normal; no masses,  no organomegaly Extremities: extremities normal, atraumatic, no cyanosis or edema Pulses: 2+ and symmetric Skin: Skin color, texture, turgor  normal. No rashes or lesions Neurologic: Grossly normal Psych: Pleasant  EKG: Sinus bradycardia with short PR interval of 59-personally reviewed  ASSESSMENT: 1. Labile hypertension - improved 2. Palpitations - resolved (s/p loop recorder still implanted) 3. Ectopic atrial tachycardia status post ablation 4. New onset atrial fibrillation - now on Xarelto, CHADSVASC score of 3  PLAN: 1.   Kelsey James is doing well on Xarelto - no significant palpitations. Mostly anxiety during her bridge tournaments. Blood pressure has been pretty stable.  Follow-up with me annually or sooner as necessary.  Pixie Casino, MD, [facility], [place] Director of the Advanced Lipid Disorders &  Cardiovascular Risk Reduction Clinic Diplomate of the American Board of Clinical Lipidology Attending Cardiologist  Direct Dial: [phone]  Fax: [phone]  Website:  [email or web address] Primmer 08/28/2018, 1:53 PM

## 2018-08-29 ENCOUNTER — Telehealth: Payer: Self-pay | Admitting: Internal Medicine

## 2018-08-29 MED ORDER — POTASSIUM CHLORIDE CRYS ER 20 MEQ PO TBCR: 20.0000 meq | EXTENDED_RELEASE_TABLET | Freq: Every day | ORAL | 0 refills | Status: DC | PRN

## 2018-08-29 NOTE — Telephone Encounter (Signed)
Rx(s) sent to pharmacy electronically per MD

## 2018-08-29 NOTE — Telephone Encounter (Signed)
LM with spouse to have patient return call.   After MD visit yesterday, patient requested potassium refill. Upon review of chart, this is not active Rx and was removed from med list in 2017. Patient stated to MD that she takes this when she takes her diuretic. Will need to verify dose patient is taking and per MD, if patient wishes to have an Rx for potassium, she will need BMET as her K+ was normal based on last labs in Epic to ensure she needs Rx and if so, what dose.

## 2018-08-29 NOTE — Telephone Encounter (Signed)
Patient returned call. She reports she takes K+ 45mEq daily prn for eye twitching and leg cramping. She does not take this often. She has used OTC potassium in the past but does not trust this as she is not sure how much potassium is actually in these supplements. Will notify MD to advise on whether OK to refill K+

## 2018-08-29 NOTE — Telephone Encounter (Signed)
Ok to refill PRN.  Dr. Lemmie Evens

## 2018-09-19 ENCOUNTER — Other Ambulatory Visit: Payer: Self-pay | Admitting: Internal Medicine

## 2018-10-31 ENCOUNTER — Other Ambulatory Visit: Payer: Self-pay | Admitting: Internal Medicine

## 2018-11-25 ENCOUNTER — Other Ambulatory Visit: Payer: Self-pay | Admitting: Internal Medicine

## 2019-01-21 ENCOUNTER — Other Ambulatory Visit: Payer: Self-pay | Admitting: Internal Medicine

## 2019-01-25 ENCOUNTER — Other Ambulatory Visit: Payer: Self-pay | Admitting: Internal Medicine

## 2019-02-12 ENCOUNTER — Encounter: Payer: Medicare Other | Admitting: Women's Health

## 2019-03-07 ENCOUNTER — Other Ambulatory Visit: Payer: Self-pay | Admitting: Women's Health

## 2019-03-07 DIAGNOSIS — G4709 Other insomnia: Secondary | ICD-10-CM

## 2019-03-07 NOTE — Telephone Encounter (Signed)
Ok for refill? 

## 2019-03-07 NOTE — Telephone Encounter (Signed)
Rx called in 

## 2019-03-07 NOTE — Telephone Encounter (Signed)
Annual exam scheduled on 03/26/19

## 2019-03-26 ENCOUNTER — Encounter: Payer: Medicare Other | Admitting: Women's Health

## 2019-04-19 ENCOUNTER — Other Ambulatory Visit: Payer: Self-pay | Admitting: Internal Medicine

## 2019-04-19 NOTE — Telephone Encounter (Signed)
Pt is a 78 yr old female who saw Dr. Debara Pickett on 08/28/18, weight was 55.6Kg. Last noted SCr was 0.55 on 12/19/17. LMOM for pt that we can do a 62mth supply with 1 refill until labs are done. She did have a recall that was sent in May for Dr. Curt Bears appt for this month. I instructed her to call make appt for Dr. Curt Bears and labs can be done at that appt. CrCl using this lab is 33mL/min. Will refill Xarelto 20mg  QD.

## 2019-05-29 ENCOUNTER — Encounter: Payer: Self-pay | Admitting: Women's Health

## 2019-05-31 ENCOUNTER — Other Ambulatory Visit: Payer: Self-pay

## 2019-05-31 ENCOUNTER — Encounter (HOSPITAL_COMMUNITY): Payer: Self-pay | Admitting: Emergency Medicine

## 2019-05-31 ENCOUNTER — Emergency Department (HOSPITAL_COMMUNITY): Payer: Medicare Other

## 2019-05-31 ENCOUNTER — Emergency Department (HOSPITAL_COMMUNITY)
Admission: EM | Admit: 2019-05-31 | Discharge: 2019-05-31 | Disposition: A | Discharge status: Home / Self Care | Payer: Medicare Other | Attending: Emergency Medicine | Admitting: Emergency Medicine

## 2019-05-31 DIAGNOSIS — R55 Syncope and collapse: Secondary | ICD-10-CM

## 2019-05-31 DIAGNOSIS — E876 Hypokalemia: Secondary | ICD-10-CM | POA: Diagnosis not present

## 2019-05-31 DIAGNOSIS — I480 Paroxysmal atrial fibrillation: Secondary | ICD-10-CM | POA: Insufficient documentation

## 2019-05-31 DIAGNOSIS — Z87891 Personal history of nicotine dependence: Secondary | ICD-10-CM | POA: Diagnosis not present

## 2019-05-31 DIAGNOSIS — Y999 Unspecified external cause status: Secondary | ICD-10-CM | POA: Insufficient documentation

## 2019-05-31 DIAGNOSIS — G4489 Other headache syndrome: Secondary | ICD-10-CM | POA: Diagnosis not present

## 2019-05-31 DIAGNOSIS — M791 Myalgia, unspecified site: Secondary | ICD-10-CM | POA: Insufficient documentation

## 2019-05-31 DIAGNOSIS — M25511 Pain in right shoulder: Secondary | ICD-10-CM | POA: Diagnosis present

## 2019-05-31 DIAGNOSIS — X58XXXA Exposure to other specified factors, initial encounter: Secondary | ICD-10-CM | POA: Diagnosis not present

## 2019-05-31 DIAGNOSIS — Y929 Unspecified place or not applicable: Secondary | ICD-10-CM | POA: Diagnosis not present

## 2019-05-31 DIAGNOSIS — I10 Essential (primary) hypertension: Secondary | ICD-10-CM | POA: Insufficient documentation

## 2019-05-31 DIAGNOSIS — S161XXA Strain of muscle, fascia and tendon at neck level, initial encounter: Secondary | ICD-10-CM

## 2019-05-31 DIAGNOSIS — Y939 Activity, unspecified: Secondary | ICD-10-CM | POA: Diagnosis not present

## 2019-05-31 DIAGNOSIS — Z79899 Other long term (current) drug therapy: Secondary | ICD-10-CM | POA: Insufficient documentation

## 2019-05-31 LAB — BASIC METABOLIC PANEL
Anion gap: 9 (ref 5–15)
BUN: 8 mg/dL (ref 8–23)
CO2: 27 mmol/L (ref 22–32)
Calcium: 8.9 mg/dL (ref 8.9–10.3)
Chloride: 98 mmol/L (ref 98–111)
Creatinine, Ser: 0.45 mg/dL (ref 0.44–1.00)
GFR calc Af Amer: >60 mL/min (ref >60)
GFR calc non Af Amer: >60 mL/min (ref >60)
Glucose, Bld: 130 mg/dL — ABNORMAL HIGH (ref 70–99)
Potassium: 2.9 mmol/L — ABNORMAL LOW (ref 3.5–5.1)
Sodium: 134 mmol/L — ABNORMAL LOW (ref 135–145)

## 2019-05-31 LAB — CBC WITH DIFFERENTIAL/PLATELET
Abs Immature Granulocytes: 0.01 10*3/uL (ref 0.00–0.07)
Basophils Absolute: 0.1 10*3/uL (ref 0.0–0.1)
Basophils Relative: 1 %
Eosinophils Absolute: 0.1 10*3/uL (ref 0.0–0.5)
Eosinophils Relative: 1 %
HCT: 38.3 % (ref 36.0–46.0)
Hemoglobin: 12.9 g/dL (ref 12.0–15.0)
Immature Granulocytes: 0 %
Lymphocytes Relative: 15 %
Lymphs Abs: 1.3 10*3/uL (ref 0.7–4.0)
MCH: 29.9 pg (ref 26.0–34.0)
MCHC: 33.7 g/dL (ref 30.0–36.0)
MCV: 88.9 fL (ref 80.0–100.0)
Monocytes Absolute: 0.6 10*3/uL (ref 0.1–1.0)
Monocytes Relative: 7 %
Neutro Abs: 6.9 10*3/uL (ref 1.7–7.7)
Neutrophils Relative %: 76 %
Platelets: 280 10*3/uL (ref 150–400)
RBC: 4.31 MIL/uL (ref 3.87–5.11)
RDW: 15.3 % (ref 11.5–15.5)
WBC: 9 10*3/uL (ref 4.0–10.5)
nRBC: 0 % (ref 0.0–0.2)

## 2019-05-31 MED ORDER — ONDANSETRON 8 MG PO TBDP
8.0000 mg | ORAL_TABLET | Freq: Once | ORAL | Status: AC
  Administered 2019-05-31: 8 mg via ORAL
  Filled 2019-05-31: qty 1

## 2019-05-31 MED ORDER — HYDROCODONE-ACETAMINOPHEN 5-325 MG PO TABS
1.0000 | ORAL_TABLET | Freq: Once | ORAL | Status: AC
  Administered 2019-05-31: 1 via ORAL
  Filled 2019-05-31: qty 1

## 2019-05-31 MED ORDER — HYDROCODONE-ACETAMINOPHEN 5-325 MG PO TABS: 1.0000 | ORAL_TABLET | Freq: Four times a day (QID) | ORAL | 0 refills | Status: DC | PRN

## 2019-05-31 MED ORDER — DOXYCYCLINE HYCLATE 100 MG PO TABS
100.0000 mg | ORAL_TABLET | Freq: Once | ORAL | Status: AC
  Administered 2019-05-31: 100 mg via ORAL
  Filled 2019-05-31: qty 1

## 2019-05-31 MED ORDER — DOXYCYCLINE HYCLATE 100 MG PO CAPS: ORAL_CAPSULE | ORAL | 0 refills | Status: DC

## 2019-05-31 MED ORDER — ONDANSETRON 8 MG PO TBDP: 8.0000 mg | ORAL_TABLET | Freq: Three times a day (TID) | ORAL | 0 refills | Status: DC | PRN

## 2019-05-31 MED ORDER — ONDANSETRON HCL 4 MG/2ML IJ SOLN
4.0000 mg | Freq: Once | INTRAMUSCULAR | Status: AC
  Administered 2019-05-31: 4 mg via INTRAVENOUS
  Filled 2019-05-31: qty 2

## 2019-05-31 MED ORDER — POTASSIUM CHLORIDE CRYS ER 20 MEQ PO TBCR
40.0000 meq | EXTENDED_RELEASE_TABLET | Freq: Once | ORAL | Status: AC
  Administered 2019-05-31: 05:00:00 40 meq via ORAL
  Filled 2019-05-31: qty 2

## 2019-05-31 NOTE — ED Notes (Signed)
Patient's [place] collar removed.

## 2019-05-31 NOTE — ED Triage Notes (Signed)
Pt c/o Bilateral shoulder pain radiating to head. Possible syncopal episode. Pt states woke up on bathroom floor.Tylenol at home with no relief

## 2019-05-31 NOTE — ED Provider Notes (Signed)
[facility] EMERGENCY DEPARTMENT Provider Note   CSN: [id number] Arrival date & time: 05/31/19  0206     History   Chief Complaint Chief Complaint  Patient presents with   Shoulder Pain    HPI Kelsey James is a 78 y.o. female.     The history is provided by the patient.  Shoulder Pain Location:  Shoulder Shoulder location:  R shoulder and L shoulder Pain details:    Quality:  Aching   Radiates to: neck and head.   Severity:  Moderate   Onset quality:  Gradual   Timing:  Constant   Progression:  Worsening Relieved by:  Nothing Worsened by:  Movement Associated symptoms: neck pain   Associated symptoms: no back pain and no fever   Patient with history of atrial fibrillation on anticoagulation Presents with syncopal episode.  She reports over the past several days she been having increasing pain in her shoulders that radiates into her neck and head.  Tonight the pain was so severe it caused her to pass out.  She does not think she hit her head.  No chest pain or shortness of breath. No other acute traumatic injuries reported.  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   Palpitations 05/13/2014   Abnormal nuclear stress test 02/04/2014   Essential hypertension 01/14/2014   Chest pressure 01/14/2014   Edema of foot 01/14/2014    Past Surgical History:  Procedure Laterality Date   ATRIAL TACH ABLATION  11/04/2015   COLONOSCOPY     ELECTROPHYSIOLOGIC STUDY N/A 11/04/2015   Procedure: A-Tach Ablation;  Surgeon: Will [name], MD;  Location: [place] [facility];  Service: Cardiovascular;   Laterality: N/A;   LEFT HEART CATHETERIZATION WITH CORONARY ANGIOGRAM N/A 03/12/2014   Procedure: LEFT HEART CATHETERIZATION WITH CORONARY ANGIOGRAM;  Surgeon: Pixie Casino, MD;  Location: [facility] CATH LAB;  Service: Cardiovascular;  Laterality: N/A;   LOOP RECORDER IMPLANT N/A 09/15/2014   Procedure: LOOP RECORDER IMPLANT;  Surgeon: Deboraha Sprang, MD;  Location: [facility] CATH LAB;  Service: Cardiovascular;  Laterality: N/A;   LOOP RECORDER REMOVAL N/A 09/26/2017   Procedure: LOOP RECORDER REMOVAL;  Surgeon: Constance Haw, MD;  Location: [place] [facility];  Service: Cardiovascular;  Laterality: N/A;   MASS EXCISION Right 09/25/2014   Procedure: RIGHT RING FINGER EXCISION MASS AND DEBRIDEMENT DIP JOINT;  Surgeon: Leanora Cover, MD;  Location: [place];  Service: Orthopedics;  Laterality: Right;   TONSILLECTOMY AND ADENOIDECTOMY  1979   TUBAL LIGATION       OB History    Gravida  2   Para  2   Term      Preterm  2   AB      Living  2     SAB      TAB      Ectopic      Multiple      Live Births               Home Medications    Prior to  Admission medications   Medication Sig Start Date End Date Taking? Authorizing Provider  acetaminophen (TYLENOL) 500 MG tablet Take 500 mg every 6 (six) hours as needed by mouth (pain).     [provider]  ALPRAZolam Duanne Moron) 0.25 MG tablet TAKE 1 TABLET BY MOUTH AT BEDTIME AS NEEDED FOR ANXIETY 03/07/19   Huel Cote, NP  amLODipine (NORVASC) 5 MG tablet TAKE 1.5 TABLETS DAILY 09/21/18   Hilty, Nadean Corwin, MD  Biotin 1000 MCG tablet Take 1,000 mcg by mouth daily.    [provider]  Calcium-Magnesium (CALCIUM MAGNESIUM 750) 300-300 MG TABS Take 1 tablet by mouth 2 (two) times daily.     [provider]  chlorthalidone (HYGROTON) 25 MG tablet TAKE 0.5 TABLETS (12.5 MG TOTAL) BY MOUTH DAILY. 01/21/19 04/21/19  Hilty, Nadean Corwin, MD  hydroxypropyl methylcellulose (ISOPTO TEARS) 2.5 %  ophthalmic solution Place 1 drop into both eyes as needed for dry eyes.    [provider]  KLOR-CON M20 20 MEQ tablet TAKE 1 TABLET (20 MEQ TOTAL) BY MOUTH DAILY AS NEEDED. 11/27/18   Hilty, Nadean Corwin, MD  LECITHIN PO Take 1 capsule 2 (two) times daily by mouth.    [provider]  Omega-3 Fatty Acids (FISH OIL PO) Take 1 capsule by mouth 2 (two) times daily.     [provider]  OVER THE COUNTER MEDICATION Take 1 capsule 2 (two) times daily by mouth. Florasil Supllement    [provider]  OVER THE COUNTER MEDICATION Take 1 capsule 2 (two) times daily by mouth. Bioastin Supplement    [provider]  travoprost, benzalkonium, (TRAVATAN) 0.004 % ophthalmic solution Place 1 drop at bedtime into both eyes.     [provider]  XARELTO 20 MG TABS tablet TAKE 1 TABLET BY MOUTH EVERY DAY WITH SUPPER 04/19/19   Hilty, Nadean Corwin, MD    Family History Family History  Problem Relation Age of Onset   Valvular heart disease Mother    Depression Father    Cancer Maternal Grandmother     Social History Social History   Tobacco Use   Smoking status: Former Smoker    Packs/day: 0.50    Years: 25.00    Pack years: 12.50    Types: Cigarettes    Quit date: 11/22/2002    Years since quitting: 16.5   Smokeless tobacco: Never Used  Substance Use Topics   Alcohol use: Yes    Alcohol/week: 7.0 standard drinks    Types: 7 Glasses of wine per week   Drug use: No     Allergies   Other and Shellfish allergy   Review of Systems Review of Systems  Constitutional: Negative for fever.  Eyes: Negative for visual disturbance.       Denies acute visual changes  Musculoskeletal: Positive for neck pain. Negative for back pain.  Neurological: Positive for headaches.  All other systems reviewed and are negative.    Physical Exam Updated Vital Signs BP 111/74    Pulse 61    Temp 97.9 F (36.6 C) (Oral)    Resp 14    Ht 1.626 m (5\' 4" )    Wt  54.4 kg    SpO2 100%    BMI 20.60 kg/m   Physical Exam CONSTITUTIONAL: Elderly and frail HEAD: Normocephalic/atraumatic EYES: EOMI/PERRL ENMT: Mucous membranes moist NECK: supple no meningeal signs SPINE/BACK:entire spine nontender, cervical paraspinal tenderness, no bruising/crepitance/stepoffs noted to spine CV: S1/S2 noted, no murmurs/rubs/gallops noted LUNGS: Lungs  are clear to auscultation bilaterally, no apparent distress ABDOMEN: soft, nontender, no rebound or guarding, bowel sounds noted throughout abdomen GU:no cva tenderness NEURO: Pt is awake/alert/appropriate, moves all extremitiesx4.  No facial droop.  No arm or leg drift, no focal weakness EXTREMITIES: pulses normal/equal, full ROM, all other extremities/joints palpated/ranged and nontender, pelvis stable SKIN: warm, color normal PSYCH: no abnormalities of mood noted, alert and oriented to situation  ED Treatments / Results  Labs (all labs ordered are listed, but only abnormal results are displayed) Labs Reviewed  BASIC METABOLIC PANEL - Abnormal; Notable for the following components:      Result Value   Sodium 134 (*)    Potassium 2.9 (*)    Glucose, Bld 130 (*)    All other components within normal limits  CBC WITH DIFFERENTIAL/PLATELET    EKG EKG Interpretation  Date/Time:  Friday May 31 2019 03:00:16 EDT Ventricular Rate:  57 PR Interval:    QRS Duration: 103 QT Interval:  472 QTC Calculation: 460 R Axis:   77 Text Interpretation:  Sinus rhythm Atrial premature complex Low voltage, extremity leads No significant change since last tracing Confirmed by Ripley Fraise [phone]) on 05/31/2019 3:08:45 AM   Radiology Ct Head Wo Contrast  Result Date: 05/31/2019 CLINICAL DATA:  78 year old female with pain radiating from the head to the shoulders. Possible syncope. EXAM: CT HEAD WITHOUT CONTRAST CT CERVICAL SPINE WITHOUT CONTRAST TECHNIQUE: Multidetector CT imaging of the head and cervical spine was  performed following the standard protocol without intravenous contrast. Multiplanar CT image reconstructions of the cervical spine were also generated. COMPARISON:  Report of head CT 12/24/2013. FINDINGS: CT HEAD FINDINGS Brain: Right occipital pole sulcal variation versus small arachnoid cyst (series 3, image 15), is unchanged since 2015. Cerebral volume is within normal limits for age. No midline shift, ventriculomegaly, mass effect, intracranial hemorrhage or evidence of cortically based acute infarction. Gray-white matter differentiation is within normal limits throughout the brain. No cortical encephalomalacia. Vascular: Calcified atherosclerosis at the skull base. No suspicious intracranial vascular hyperdensity. Skull: Intact. Sinuses/Orbits: Visualized paranasal sinuses and mastoids are clear. Other: No acute orbit or scalp soft tissue findings. CT CERVICAL SPINE FINDINGS Alignment: Mild degenerative appearing anterolisthesis of C2 on C3. Straightening of mid cervical lordosis. Mild degenerative appearing retrolisthesis of C5 on C6. Cervicothoracic junction alignment is within normal limits. Bilateral posterior element alignment is within normal limits. Skull base and vertebrae: Visualized skull base is intact. No atlanto-occipital dissociation. No acute osseous abnormality identified. Soft tissues and spinal canal: No prevertebral fluid or swelling. No visible canal hematoma. Negative noncontrast neck soft tissues. Disc levels: Left vertebral artery tortuosity resulting in smooth scalloping of bone along the left transverse foramen (e.g. Series 8, image 40 at the C4 level). Chronic disc and endplate degeneration throughout much of the cervical spine. Up to mild associated degenerative spinal stenosis. Upper chest: There are bilateral C7 cervical ribs. Negative visible upper thoracic levels. Negative lung apices. IMPRESSION: 1. 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 2. No acute acute traumatic injury identified in the cervical spine. 3. Tortuous left vertebral artery.  Bilateral C7 cervical ribs. Electronically Signed   By: Genevie Ann M.D.   On: 05/31/2019 03:50   Ct Cervical Spine Wo Contrast  Result Date: 05/31/2019 CLINICAL DATA:  78 year old female with pain radiating from the head to the shoulders. Possible syncope. EXAM: CT HEAD WITHOUT CONTRAST CT CERVICAL  SPINE WITHOUT CONTRAST TECHNIQUE: Multidetector CT imaging of the head and cervical spine was performed following the standard protocol without intravenous contrast. Multiplanar CT image reconstructions of the cervical spine were also generated. COMPARISON:  Report of head CT 12/24/2013. FINDINGS: CT HEAD FINDINGS Brain: Right occipital pole sulcal variation versus small arachnoid cyst (series 3, image 15), is unchanged since 2015. Cerebral volume is within normal limits for age. No midline shift, ventriculomegaly, mass effect, intracranial hemorrhage or evidence of cortically based acute infarction. Gray-white matter differentiation is within normal limits throughout the brain. No cortical encephalomalacia. Vascular: Calcified atherosclerosis at the skull base. No suspicious intracranial vascular hyperdensity. Skull: Intact. Sinuses/Orbits: Visualized paranasal sinuses and mastoids are clear. Other: No acute orbit or scalp soft tissue findings. CT CERVICAL SPINE FINDINGS Alignment: Mild degenerative appearing anterolisthesis of C2 on C3. Straightening of mid cervical lordosis. Mild degenerative appearing retrolisthesis of C5 on C6. Cervicothoracic junction alignment is within normal limits. Bilateral posterior element alignment is within normal limits. Skull base and vertebrae: Visualized skull base is intact. No atlanto-occipital dissociation. No acute osseous abnormality identified. Soft tissues and spinal canal: No prevertebral fluid or swelling. No visible  canal hematoma. Negative noncontrast neck soft tissues. Disc levels: Left vertebral artery tortuosity resulting in smooth scalloping of bone along the left transverse foramen (e.g. Series 8, image 40 at the C4 level). Chronic disc and endplate degeneration throughout much of the cervical spine. Up to mild associated degenerative spinal stenosis. Upper chest: There are bilateral C7 cervical ribs. Negative visible upper thoracic levels. Negative lung apices. IMPRESSION: 1. 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 2. No acute acute traumatic injury identified in the cervical spine. 3. Tortuous left vertebral artery.  Bilateral C7 cervical ribs. Electronically Signed   By: Genevie Ann M.D.   On: 05/31/2019 03:50   Dg Chest Port 1 View  Result Date: 05/31/2019 CLINICAL DATA:  Weakness EXAM: PORTABLE CHEST 1 VIEW COMPARISON:  03/05/2014 FINDINGS: No acute opacity or pleural effusion. Borderline cardiomegaly with aortic atherosclerosis. No pneumothorax. IMPRESSION: No active disease.  Borderline cardiomegaly. Electronically Signed   By: Donavan Foil M.D.   On: 05/31/2019 03:12    Procedures Procedures   Medications Ordered in ED Medications  doxycycline (VIBRA-TABS) tablet 100 mg (has no administration in time range)  HYDROcodone-acetaminophen (NORCO/VICODIN) 5-325 MG per tablet 1 tablet (has no administration in time range)  ondansetron (ZOFRAN) injection 4 mg (4 mg Intravenous Given 05/31/19 0322)  potassium chloride SA (K-DUR) CR tablet 40 mEq (40 mEq Oral Given 05/31/19 0514)     Initial Impression / Assessment and Plan / ED Course  I have reviewed the triage vital signs and the nursing notes.  Pertinent labs & imaging results that were available during my care of the patient were reviewed by me and considered in my medical decision making (see chart for details).        3:09 AM Patient reports atraumatic  shoulder and neck pain over the past several days, now reports a syncopal episode due to pain.  She has no signs of acute stroke. However she did fall and is having worsening neck pain and headache.  CT imaging will be ordered She declines pain meds at this time 6:05 AM Patient presented with multiple complaints.  She reports recent pain in her neck and head and shoulder, tonight when standing up the pain became severe she reports that she collapsed to  her knees with a brief syncopal episode.  She denies any trauma from the fall.  She flatly denies chest pain or shortness of breath.  No abdominal pain. Review of chart, it appears that she has had symptoms like this previously.  CT imaging negative for acute traumatic injury.  No focal weakness to suggest stroke.  No new visual changes to suggest stroke. Patient was able to ambulate in the ER without difficulty.  I have low suspicion for acute neurologic emergency.  Patient feels comfortable for discharge home.  She is concerned as she reports she had about 7 tick bites recently.  After further discussion, we did agree to start her on doxycycline for 10 days in lieu of testing for tickborne illness.  She has potassium at home, she will take a dose each day for the next 3 days. Advised follow-up with PCP in a week if there is no improvement Final Clinical Impressions(s) / ED Diagnoses   Final diagnoses:  Syncope and collapse  Strain of neck muscle, initial encounter  Other headache syndrome  Hypokalemia  Myalgia    ED Discharge Orders         Ordered    doxycycline (VIBRAMYCIN) 100 MG capsule     05/31/19 0546    HYDROcodone-acetaminophen (NORCO/VICODIN) 5-325 MG tablet  Every 6 hours PRN     05/31/19 0555           Ripley Fraise, MD 05/31/19 [phone]

## 2019-05-31 NOTE — ED Notes (Signed)
Patient ambulated to the bathroom. Patient ambulated with no problems.

## 2019-06-07 ENCOUNTER — Other Ambulatory Visit: Payer: Self-pay

## 2019-06-07 DIAGNOSIS — G4709 Other insomnia: Secondary | ICD-10-CM

## 2019-06-07 MED ORDER — ALPRAZOLAM 0.25 MG PO TABS: ORAL_TABLET | ORAL | 0 refills | Status: DC

## 2019-06-07 NOTE — Telephone Encounter (Signed)
Called into pharmacy

## 2019-06-07 NOTE — Telephone Encounter (Signed)
Ok for refill? 

## 2019-06-14 ENCOUNTER — Other Ambulatory Visit: Payer: Self-pay | Admitting: Internal Medicine

## 2019-06-17 ENCOUNTER — Ambulatory Visit: Payer: Medicare Other | Attending: Family Medicine | Admitting: Physical Therapy

## 2019-06-17 ENCOUNTER — Other Ambulatory Visit: Payer: Self-pay

## 2019-06-17 DIAGNOSIS — M5442 Lumbago with sciatica, left side: Secondary | ICD-10-CM | POA: Insufficient documentation

## 2019-06-17 DIAGNOSIS — M6281 Muscle weakness (generalized): Secondary | ICD-10-CM | POA: Diagnosis present

## 2019-06-17 DIAGNOSIS — G8929 Other chronic pain: Secondary | ICD-10-CM | POA: Diagnosis present

## 2019-06-17 DIAGNOSIS — M542 Cervicalgia: Secondary | ICD-10-CM | POA: Insufficient documentation

## 2019-06-17 DIAGNOSIS — R252 Cramp and spasm: Secondary | ICD-10-CM | POA: Insufficient documentation

## 2019-06-17 DIAGNOSIS — M5441 Lumbago with sciatica, right side: Secondary | ICD-10-CM | POA: Diagnosis present

## 2019-06-17 DIAGNOSIS — R279 Unspecified lack of coordination: Secondary | ICD-10-CM | POA: Diagnosis present

## 2019-06-17 NOTE — Therapy (Signed)
[facility] [address], [place], [place], [address] Phone: [phone]   Fax:  [phone]  Physical Therapy Evaluation  Patient Details  Name: Kelsey James MRN: [id number] Date of Birth: 1941/01/08 Referring Provider (PT): Joya Gaskins, [place]   Encounter Date: 06/17/2019  PT End of Session - 06/17/19 1617    Visit Number  1    Date for PT Re-Evaluation  08/12/19    PT Start Time  1450    PT Stop Time  1535    PT Time Calculation (min)  45 min    Activity Tolerance  Patient tolerated treatment well;Patient limited by pain    Behavior During Therapy  [facility] for tasks assessed/performed       Past Medical History:  Diagnosis Date  . A-fib ([place])   . Arthritis    "hands" (11/04/2015)  . Dry eyes   . Glaucoma   . Osteopenia   . Rosacea   . Wears glasses     Past Surgical History:  Procedure Laterality Date  . ATRIAL TACH ABLATION  11/04/2015  . COLONOSCOPY    . ELECTROPHYSIOLOGIC STUDY N/A 11/04/2015   Procedure: A-Tach Ablation;  Surgeon: Will [name], MD;  Location: [place] [facility];  Service: Cardiovascular;  Laterality: N/A;  . LEFT HEART CATHETERIZATION WITH CORONARY ANGIOGRAM N/A 03/12/2014   Procedure: LEFT HEART CATHETERIZATION WITH CORONARY ANGIOGRAM;  Surgeon: Pixie Casino, MD;  Location: [facility] CATH LAB;  Service: Cardiovascular;  Laterality: N/A;  . LOOP RECORDER IMPLANT N/A 09/15/2014   Procedure: LOOP RECORDER IMPLANT;  Surgeon: Deboraha Sprang, MD;  Location: [facility] CATH LAB;  Service: Cardiovascular;  Laterality: N/A;  . LOOP RECORDER REMOVAL N/A 09/26/2017   Procedure: LOOP RECORDER REMOVAL;  Surgeon: Constance Haw, MD;  Location: [place] [facility];  Service: Cardiovascular;  Laterality: N/A;  . MASS EXCISION Right 09/25/2014   Procedure: RIGHT RING FINGER EXCISION MASS AND DEBRIDEMENT DIP JOINT;  Surgeon: Leanora Cover, MD;  Location: [place];  Service:  Orthopedics;  Laterality: Right;  . TONSILLECTOMY AND ADENOIDECTOMY  1979  . TUBAL LIGATION      There were no vitals filed for this visit.   Subjective Assessment - 06/17/19 1450    Subjective  Pt has drawn a diagram of pain.  She is here for referral for low back but feels like her issue started in Rt shoulder.  Pain became wide spread on July 16.  Pt    Limitations  Lifting;Standing;Sitting    How long can you sit comfortably?  no time    How long can you stand comfortably?  15-20 min    How long can you walk comfortably?  15-20 min    Patient Stated Goals  Get rid of pain    Currently in Pain?  Yes    Pain Score  10-Worst pain ever   25/10   Pain Location  Back    Pain Orientation  Mid;Upper;Lower;Left;Right    Pain Descriptors / Indicators  Sore;Shooting;Radiating    Pain Type  Acute pain    Pain Radiating Towards  down to coccys, up to or down from Rt shouler, up to Rt side of head around to the eye, cramping and pain down back of both LE down to lower leg    Pain Onset  More than a month ago    Pain Frequency  Constant    Aggravating Factors   sitting for long time, standing  a long time    Pain Relieving Factors  xanax to sleep, walking doesn't make it worse    Effect of Pain on Daily Activities  can't do anything, can't do yard work    Multiple Pain Sites  No         [facility] PT Assessment - 06/17/19 0001      Assessment   Medical Diagnosis  M54.5 (ICD-10-CM) - Low back pain    Referring Provider (PT)  Joya Gaskins, FNP    Onset Date/Surgical Date  05/01/19    Prior Therapy  Yes HHPT      Precautions   Precautions  None      Restrictions   Weight Bearing Restrictions  No      Balance Screen   Has the patient fallen in the past 6 months  No      [place] residence    Living Arrangements  Spouse/significant other      Prior Function   Level of Independence  Independent      Cognition   Overall Cognitive Status   Within Functional Limits for tasks assessed      Observation/Other Assessments   Focus on Therapeutic Outcomes (FOTO)   77% limited      Posture/Postural Control   Posture/Postural Control  Postural limitations    Postural Limitations  Rounded Shoulders    Posture Comments  difficulty with belly breathing and not using accessory muscles in the neck/shoulders      ROM / Strength   AROM / PROM / Strength  Strength;AROM      AROM   Overall AROM Comments  pain with flex/ext    AROM Assessment Site  Lumbar    Lumbar Flexion  25%    Lumbar Extension  25%    Lumbar - Right Side Bend  50%    Lumbar - Left Side Bend  50%    Lumbar - Right Rotation  WFL    Lumbar - Left Rotation  WFL      Strength   Overall Strength Comments  4/5 throughout LE with pain      Flexibility   Soft Tissue Assessment /Muscle Length  yes    Hamstrings  50% bilateral      Palpation   SI assessment   WFL    Palpation comment  cervical and lumbar paraspinals tight, suboccipitals tight      Ambulation/Gait   Gait Comments  very slow and guarded                Objective measurements completed on examination: See above findings.      [facility] Adult PT Treatment/Exercise - 06/17/19 0001      Self-Care   Self-Care  Other Self-Care Comments    Other Self-Care Comments   educated and performed diaphragmatic breathing             PT Education - 06/17/19 1613    Education Details  Access Code: [email or web address]    Person(s) Educated  Patient    Methods  Explanation;Demonstration;Handout;Verbal cues;Tactile cues    Comprehension  Verbalized understanding;Returned demonstration       PT Short Term Goals - 06/17/19 1720      PT SHORT TERM GOAL #1   Title  ind with initial HEP    Time  4    Period  Weeks    Status  New    Target Date  07/15/19  PT Long Term Goals - 06/17/19 1623      PT LONG TERM GOAL #1   Title  Pt will be ind with advanced HEP    Time  8    Period  Weeks     Status  New    Target Date  08/12/19      PT LONG TERM GOAL #2   Title  Pt will report 50% reduced pain    Time  8    Period  Weeks    Status  New    Target Date  08/12/19      PT LONG TERM GOAL #3   Title  Pt will be able to demonstrate forward flexion of lumbar spine with hands reaching to knees in order to pick up items off the floor more easily    Time  8    Period  Weeks    Status  New    Target Date  08/12/19      PT LONG TERM GOAL #4   Title  FOTO </= to 49% limited    Time  8    Period  Weeks    Status  New    Target Date  08/12/19             Plan - 06/17/19 1538    Clinical Impression Statement  Pt presents to skilled PT due to referral for low back pain, but is also having pain from coccyx up into back of the head.  Pt has very slow and guarded gait with shortened step length.  She has very little trunk or cervical movement when turning.  She does have normal lumbar rotation when prompted, but no lumbar flexion or extension and increased pain with attempt at either.  Pt has tight hamstrings with 50 deg of hip flexion in supine.  She is positive for neural tension on Lt LE but negative on Rt.  Her Rt cervical spine and suboccipital muscles are tight Rt>Lt.  Pt has some limited ER bilat hips Lt>Rt.  Pt will benefit from skilled PT to address the impairments and return to maximum function.  She may benefit from aquatic therapy in order to improve functional movements and muscle coordination for transfers in load reduced environment.    Personal Factors and Comorbidities  Age    Examination-Activity Limitations  Bathing;Bed Mobility;Bend;Caring for Others;Lift;Sit;Sleep;Stairs;Stand    Examination-Participation Restrictions  Cleaning    Stability/Clinical Decision Making  Evolving/Moderate complexity    Clinical Decision Making  Moderate    Rehab Potential  Good    PT Frequency  2x / week    PT Duration  8 weeks    PT Treatment/Interventions  ADLs/Self Care Home  Management;Aquatic Therapy;Traction;Moist Heat;Electrical Stimulation;Cryotherapy;Biofeedback;Gait training;Stair training;Therapeutic activities;Therapeutic exercise;Neuromuscular re-education;Patient/family education;Manual techniques;Passive range of motion;Dry needling;Taping    PT Next Visit Plan  lumbar traction?, STM lumbar and cervical spine, thoracic mobs, posture, review breathing with pelvic floor lengthening, lumbar stretches    PT Home Exercise Plan  Access Code: [email or web address]    Recommended Other Services  x-ray to assess lubar/sacral/coccyx bones for abnormailities    Consulted and Agree with Plan of Care  Patient       Patient will benefit from skilled therapeutic intervention in order to improve the following deficits and impairments:  Decreased coordination, Pain, Increased fascial restricitons, Decreased strength, Decreased activity tolerance, Postural dysfunction, Increased muscle spasms, Abnormal gait  Visit Diagnosis: 1. Chronic bilateral low back pain with bilateral sciatica   2. Cervicalgia  3. Muscle weakness (generalized)   4. Unspecified lack of coordination        Problem List Patient Active Problem List   Diagnosis Date Noted  . Paroxysmal atrial fibrillation ([place]) 02/08/2017  . Anxiety 03/30/2016  . Osteopenia 01/27/2016  . Atrial tachycardia ([place]) 11/04/2015  . 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 Palpitations 05/13/2014  . Abnormal nuclear stress test 02/04/2014  . Essential hypertension 01/14/2014  . Chest pressure 01/14/2014  . Edema of foot 01/14/2014    Jule Ser, PT 06/17/2019, 5:32 PM  [facility] [address], [place], [place], [address] Phone: [phone]   Fax:  [phone]  Name: NAFISAH RUNIONS MRN: [id number] Date of Birth: 04-Dec-1940

## 2019-06-17 NOTE — Patient Instructions (Signed)
Access Code: [email or web address]  URL: [email or web address]  Date: 06/17/2019  Prepared by: Jari Favre   Exercises  Supine Diaphragmatic Breathing with Pelvic Floor Lengthening - 10 reps - 1 sets - 3x daily - 7x weekly

## 2019-06-19 ENCOUNTER — Ambulatory Visit: Payer: Medicare Other | Admitting: Physical Therapy

## 2019-06-19 ENCOUNTER — Other Ambulatory Visit: Payer: Self-pay

## 2019-06-19 DIAGNOSIS — M6281 Muscle weakness (generalized): Secondary | ICD-10-CM

## 2019-06-19 DIAGNOSIS — R279 Unspecified lack of coordination: Secondary | ICD-10-CM

## 2019-06-19 DIAGNOSIS — G8929 Other chronic pain: Secondary | ICD-10-CM

## 2019-06-19 DIAGNOSIS — M5441 Lumbago with sciatica, right side: Secondary | ICD-10-CM

## 2019-06-19 DIAGNOSIS — M542 Cervicalgia: Secondary | ICD-10-CM

## 2019-06-19 DIAGNOSIS — M5442 Lumbago with sciatica, left side: Secondary | ICD-10-CM | POA: Diagnosis not present

## 2019-06-19 NOTE — Therapy (Addendum)
[facility] [address] [place], [place], [address] Phone: [phone]   Fax:  [phone]  Physical Therapy Treatment  Patient Details  Name: Kelsey James MRN: [id number] Date of Birth: 12/13/1940 Referring Provider (PT): Joya Gaskins, [place]   Encounter Date: 06/19/2019  Visit 2     Past Medical History:  Diagnosis Date  . A-fib ([place])   . Arthritis    "hands" (11/04/2015)  . Dry eyes   . Glaucoma   . Osteopenia   . Rosacea   . Wears glasses     Past Surgical History:  Procedure Laterality Date  . ATRIAL TACH ABLATION  11/04/2015  . COLONOSCOPY    . ELECTROPHYSIOLOGIC STUDY N/A 11/04/2015   Procedure: A-Tach Ablation;  Surgeon: Will [name], MD;  Location: [place] [facility];  Service: Cardiovascular;  Laterality: N/A;  . LEFT HEART CATHETERIZATION WITH CORONARY ANGIOGRAM N/A 03/12/2014   Procedure: LEFT HEART CATHETERIZATION WITH CORONARY ANGIOGRAM;  Surgeon: Pixie Casino, MD;  Location: [facility] CATH LAB;  Service: Cardiovascular;  Laterality: N/A;  . LOOP RECORDER IMPLANT N/A 09/15/2014   Procedure: LOOP RECORDER IMPLANT;  Surgeon: Deboraha Sprang, MD;  Location: [facility] CATH LAB;  Service: Cardiovascular;  Laterality: N/A;  . LOOP RECORDER REMOVAL N/A 09/26/2017   Procedure: LOOP RECORDER REMOVAL;  Surgeon: Constance Haw, MD;  Location: [place] [facility];  Service: Cardiovascular;  Laterality: N/A;  . MASS EXCISION Right 09/25/2014   Procedure: RIGHT RING FINGER EXCISION MASS AND DEBRIDEMENT DIP JOINT;  Surgeon: Leanora Cover, MD;  Location: [place];  Service: Orthopedics;  Laterality: Right;  . TONSILLECTOMY AND ADENOIDECTOMY  1979  . TUBAL LIGATION      There were no vitals filed for this visit.  Subjective Assessment - 06/19/19 1518    Subjective  Pt arriving to therapy reporting 9-10/10 pain in her cocoyx. Pt presenting with garded gait pattern with decreasd step  length. Pt unable to tolerate sitting. Reviewed breathing exercises. Manual therapy performed in cervical, Lumbar and Thoracic paraspainals. PROM to cervical spine, Upper Trap and levator. Active trigger points noted in R upper trap and along medial scapular border on the left. At end of session pt reporting 8/10 pain. Continue skilled PT.    Limitations  Lifting;Standing;Sitting    How long can you stand comfortably?  15-20 min    How long can you walk comfortably?  15-20 min    Patient Stated Goals  Get rid of pain    Currently in Pain?  Yes    Pain Score  10-Worst pain ever    Pain Location  Back    Pain Orientation  Upper;Mid;Lower    Pain Descriptors / Indicators  Radiating;Aching;Constant    Pain Type  Acute pain    Pain Onset  More than a month ago    Pain Frequency  Constant                       [facility] Adult PT Treatment/Exercise - 06/19/19 0001      Ambulation/Gait   Gait Comments  very slow and guarded      Modalities   Modalities  Moist Heat      Moist Heat Therapy   Number Minutes Moist Heat  10 Minutes    Moist Heat Location  Cervical;Lumbar Spine      Manual Therapy   Manual Therapy  Joint mobilization;Soft tissue mobilization;Passive ROM  Manual therapy comments  40 minutes    Joint Mobilization  grade 2-3 mobs to C3-6    Soft tissue mobilization  occipital, cervical, thoracic and lumbar paraspinals    Passive ROM  Cervical rotation and sidebending, Upper trap stretch, levator stretch   supine              PT Short Term Goals - 06/17/19 1720      PT SHORT TERM GOAL #1   Title  ind with initial HEP    Time  4    Period  Weeks    Status  New    Target Date  07/15/19        PT Long Term Goals - 06/17/19 1623      PT LONG TERM GOAL #1   Title  Pt will be ind with advanced HEP    Time  8    Period  Weeks    Status  New    Target Date  08/12/19      PT LONG TERM GOAL #2   Title  Pt will report 50% reduced pain    Time  8     Period  Weeks    Status  New    Target Date  08/12/19      PT LONG TERM GOAL #3   Title  Pt will be able to demonstrate forward flexion of lumbar spine with hands reaching to knees in order to pick up items off the floor more easily    Time  8    Period  Weeks    Status  New    Target Date  08/12/19      PT LONG TERM GOAL #4   Title  FOTO </= to 49% limited    Time  8    Period  Weeks    Status  New    Target Date  08/12/19        Plan:  Pt arriving to therapy reporting 9-10/10 pain in her cocoyx. Pt presenting with guarded gait pattern with decreased step length. Pt unable to tolerate sitting. Breathing exercises reviewed. Performed manual therapy to  cervical, thoracic and lumbar paraspinals. PROM to cervical spine, upper trap and levator, Active trigger point noted in R upper trap and along medial scapular border on the left. At end of session pt reporting 8/10 pain. Continue skilled PT to progress toward goals set with the interventions listed in pt's evaluation.   2x/week for 8 weeks      Patient will benefit from skilled therapeutic intervention in order to improve the following deficits and impairments:     Visit Diagnosis: 1. Chronic bilateral low back pain with bilateral sciatica   2. Cervicalgia   3. Muscle weakness (generalized)   4. Unspecified lack of coordination        Problem List Patient Active Problem List   Diagnosis Date Noted  . Paroxysmal atrial fibrillation ([place]) 02/08/2017  . Anxiety 03/30/2016  . Osteopenia 01/27/2016  . Atrial tachycardia ([place]) 11/04/2015  . 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 Palpitations 05/13/2014  . Abnormal nuclear stress test 02/04/2014  . Essential hypertension 01/14/2014  . Chest pressure 01/14/2014  . Edema of foot 01/14/2014    Kelsey James, PT 06/19/2019, 3:26 PM Kearney Hard, PT 07/16/19 9:23 AM    [facility] [address], [place], [place], [address] Phone: [phone]   Fax:  [phone]  Name: Kelsey James  MRN: [id number] Date of Birth: 03-28-1941

## 2019-06-24 ENCOUNTER — Other Ambulatory Visit: Payer: Self-pay

## 2019-06-24 ENCOUNTER — Ambulatory Visit: Payer: Medicare Other | Admitting: Physical Therapy

## 2019-06-24 DIAGNOSIS — M5442 Lumbago with sciatica, left side: Secondary | ICD-10-CM

## 2019-06-24 DIAGNOSIS — M542 Cervicalgia: Secondary | ICD-10-CM

## 2019-06-24 DIAGNOSIS — M6281 Muscle weakness (generalized): Secondary | ICD-10-CM

## 2019-06-24 DIAGNOSIS — R279 Unspecified lack of coordination: Secondary | ICD-10-CM

## 2019-06-24 DIAGNOSIS — G8929 Other chronic pain: Secondary | ICD-10-CM

## 2019-06-24 NOTE — Therapy (Signed)
[facility] [address] [place], [place], [address] Phone: [phone]   Fax:  [phone]  Physical Therapy Treatment  Patient Details  Name: Kelsey James MRN: [id number] Date of Birth: [birth year] Referring Provider (PT): Joya Gaskins, [place]   Encounter Date: 06/24/2019  PT End of Session - 06/24/19 1648    Visit Number  3    Date for PT Re-Evaluation  08/12/19    PT Start Time  1446    PT Stop Time  1526    PT Time Calculation (min)  40 min    Activity Tolerance  Patient tolerated treatment well;Patient limited by pain    Behavior During Therapy  [facility] for tasks assessed/performed       Past Medical History:  Diagnosis Date  . A-fib ([place])   . Arthritis    "hands" (11/04/2015)  . Dry eyes   . Glaucoma   . Osteopenia   . Rosacea   . Wears glasses     Past Surgical History:  Procedure Laterality Date  . ATRIAL TACH ABLATION  11/04/2015  . COLONOSCOPY    . ELECTROPHYSIOLOGIC STUDY N/A 11/04/2015   Procedure: A-Tach Ablation;  Surgeon: Will [name], MD;  Location: [place] [facility];  Service: Cardiovascular;  Laterality: N/A;  . LEFT HEART CATHETERIZATION WITH CORONARY ANGIOGRAM N/A 03/12/2014   Procedure: LEFT HEART CATHETERIZATION WITH CORONARY ANGIOGRAM;  Surgeon: Pixie Casino, MD;  Location: [facility] CATH LAB;  Service: Cardiovascular;  Laterality: N/A;  . LOOP RECORDER IMPLANT N/A 09/15/2014   Procedure: LOOP RECORDER IMPLANT;  Surgeon: Deboraha Sprang, MD;  Location: [facility] CATH LAB;  Service: Cardiovascular;  Laterality: N/A;  . LOOP RECORDER REMOVAL N/A 09/26/2017   Procedure: LOOP RECORDER REMOVAL;  Surgeon: Constance Haw, MD;  Location: [place] [facility] LAB;  Service: Cardiovascular;  Laterality: N/A;  . MASS EXCISION Right 09/25/2014   Procedure: RIGHT RING FINGER EXCISION MASS AND DEBRIDEMENT DIP JOINT;  Surgeon: Leanora Cover, MD;  Location: [place];  Service:  Orthopedics;  Laterality: Right;  . TONSILLECTOMY AND ADENOIDECTOMY  1979  . TUBAL LIGATION      There were no vitals filed for this visit.  Subjective Assessment - 06/24/19 1655    Subjective  Pt states she is a little better than last time.  States her coccyx is the worst right now and reports that this all started in the Rt shoulder.  Pt is still reporting a high level of pain.    Patient Stated Goals  Get rid of pain    Currently in Pain?  Yes    Pain Score  8     Pain Location  Back    Pain Orientation  Upper;Mid;Lower    Pain Descriptors / Indicators  Aching;Constant;Radiating    Pain Type  Acute pain;Chronic pain    Pain Onset  More than a month ago    Pain Frequency  Constant    Multiple Pain Sites  No                       [facility] Adult PT Treatment/Exercise - 06/24/19 0001      Exercises   Exercises  Lumbar      Lumbar Exercises: Seated   Other Seated Lumbar Exercises  thoracic extension - seated 10x 5 sec hold      Manual Therapy   Manual Therapy  Joint mobilization;Soft tissue mobilization;Myofascial release    Joint  Mobilization  grade III-IV mobs to T4-8 P-A direction, sacral distraction    Soft tissue mobilization  lumbar and gluteal Rt>Lt, Rt infraspinatus    Myofascial Release  sacral distraction in supine             PT Education - 06/24/19 1531    Education Details  Access Code: [email or web address]    Person(s) Educated  Patient    Methods  Explanation;Demonstration;Verbal cues;Handout    Comprehension  Verbalized understanding;Returned demonstration       PT Short Term Goals - 06/17/19 1720      PT SHORT TERM GOAL #1   Title  ind with initial HEP    Time  4    Period  Weeks    Status  New    Target Date  07/15/19        PT Long Term Goals - 06/17/19 1623      PT LONG TERM GOAL #1   Title  Pt will be ind with advanced HEP    Time  8    Period  Weeks    Status  New    Target Date  08/12/19      PT LONG TERM GOAL #2   Title   Pt will report 50% reduced pain    Time  8    Period  Weeks    Status  New    Target Date  08/12/19      PT LONG TERM GOAL #3   Title  Pt will be able to demonstrate forward flexion of lumbar spine with hands reaching to knees in order to pick up items off the floor more easily    Time  8    Period  Weeks    Status  New    Target Date  08/12/19      PT LONG TERM GOAL #4   Title  FOTO </= to 49% limited    Time  8    Period  Weeks    Status  New    Target Date  08/12/19            Plan - 06/24/19 1649    Clinical Impression Statement  Pt responded well to manual techniques.  Overall, she reports less pain that when she began PT.  Pt had good release from fascial massage especially around sacrum.  Pt has hypomobility T4-8 and was educated in thoracic extension for self mobilization added to HEP.  She has not met any goals yet but is making progress.  She will benefit from skilled PT to work towards functional goals.    PT Treatment/Interventions  ADLs/Self Care Home Management;Aquatic Therapy;Traction;Moist Heat;Electrical Stimulation;Cryotherapy;Biofeedback;Gait training;Stair training;Therapeutic activities;Therapeutic exercise;Neuromuscular re-education;Patient/family education;Manual techniques;Passive range of motion;Dry needling;Taping    PT Next Visit Plan  STM to Rt periscapular muscles, upper traps, scalenes, gentle core and hip strength    PT Home Exercise Plan  Access Code: [email or web address]    Consulted and Agree with Plan of Care  Patient       Patient will benefit from skilled therapeutic intervention in order to improve the following deficits and impairments:  Decreased coordination, Pain, Increased fascial restricitons, Decreased strength, Decreased activity tolerance, Postural dysfunction, Increased muscle spasms, Abnormal gait  Visit Diagnosis: 1. Chronic bilateral low back pain with bilateral sciatica   2. Cervicalgia   3. Muscle weakness (generalized)   4.  Unspecified lack of coordination        Problem List Patient Active Problem List   Diagnosis  Date Noted  . Paroxysmal atrial fibrillation ([place]) 02/08/2017  . Anxiety 03/30/2016  . Osteopenia 01/27/2016  . Atrial tachycardia ([place]) 11/04/2015  . 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 Palpitations 05/13/2014  . Abnormal nuclear stress test 02/04/2014  . Essential hypertension 01/14/2014  . Chest pressure 01/14/2014  . Edema of foot 01/14/2014    Jule Ser, PT 06/24/2019, 4:57 PM  [facility] [address], [place], [place], [address] Phone: [phone]   Fax:  [phone]  Name: AUDRYANA HOCKENBERRY MRN: [id number] Date of Birth: 1941/10/12

## 2019-06-24 NOTE — Patient Instructions (Signed)
Access Code: [email or web address]  URL: [email or web address]  Date: 06/24/2019  Prepared by: Jari Favre   Exercises  Supine Diaphragmatic Breathing with Pelvic Floor Lengthening - 10 reps - 1 sets - 3x daily - 7x weekly  Seated Thoracic Lumbar Extension with Pectoralis Stretch - 10 reps - 3 sets - 1x daily - 7x weekly

## 2019-06-26 ENCOUNTER — Ambulatory Visit: Payer: Medicare Other | Admitting: Physical Therapy

## 2019-06-26 ENCOUNTER — Other Ambulatory Visit: Payer: Self-pay

## 2019-06-26 DIAGNOSIS — M5442 Lumbago with sciatica, left side: Secondary | ICD-10-CM

## 2019-06-26 DIAGNOSIS — M6281 Muscle weakness (generalized): Secondary | ICD-10-CM

## 2019-06-26 DIAGNOSIS — R279 Unspecified lack of coordination: Secondary | ICD-10-CM

## 2019-06-26 DIAGNOSIS — G8929 Other chronic pain: Secondary | ICD-10-CM

## 2019-06-26 DIAGNOSIS — M542 Cervicalgia: Secondary | ICD-10-CM

## 2019-06-26 NOTE — Therapy (Signed)
[facility] [address], [place], [place], [address] Phone: [phone]   Fax:  [phone]  Physical Therapy Treatment  Patient Details  Name: Kelsey James MRN: [id number] Date of Birth: 10-18-1941 Referring Provider (PT): Joya Gaskins, [place]   Encounter Date: 06/26/2019  PT End of Session - 06/26/19 1448    Visit Number  4    Date for PT Re-Evaluation  08/12/19    PT Start Time  1448    PT Stop Time  1526    PT Time Calculation (min)  38 min    Activity Tolerance  Patient tolerated treatment well;Patient limited by pain    Behavior During Therapy  [facility] for tasks assessed/performed       Past Medical History:  Diagnosis Date  . A-fib ([place])   . Arthritis    "hands" (11/04/2015)  . Dry eyes   . Glaucoma   . Osteopenia   . Rosacea   . Wears glasses     Past Surgical History:  Procedure Laterality Date  . ATRIAL TACH ABLATION  11/04/2015  . COLONOSCOPY    . ELECTROPHYSIOLOGIC STUDY N/A 11/04/2015   Procedure: A-Tach Ablation;  Surgeon: Will [name], MD;  Location: [place] [facility];  Service: Cardiovascular;  Laterality: N/A;  . LEFT HEART CATHETERIZATION WITH CORONARY ANGIOGRAM N/A 03/12/2014   Procedure: LEFT HEART CATHETERIZATION WITH CORONARY ANGIOGRAM;  Surgeon: Pixie Casino, MD;  Location: [facility] CATH LAB;  Service: Cardiovascular;  Laterality: N/A;  . LOOP RECORDER IMPLANT N/A 09/15/2014   Procedure: LOOP RECORDER IMPLANT;  Surgeon: Deboraha Sprang, MD;  Location: [facility] CATH LAB;  Service: Cardiovascular;  Laterality: N/A;  . LOOP RECORDER REMOVAL N/A 09/26/2017   Procedure: LOOP RECORDER REMOVAL;  Surgeon: Constance Haw, MD;  Location: [place] [facility];  Service: Cardiovascular;  Laterality: N/A;  . MASS EXCISION Right 09/25/2014   Procedure: RIGHT RING FINGER EXCISION MASS AND DEBRIDEMENT DIP JOINT;  Surgeon: Leanora Cover, MD;  Location: [place];  Service:  Orthopedics;  Laterality: Right;  . TONSILLECTOMY AND ADENOIDECTOMY  1979  . TUBAL LIGATION      There were no vitals filed for this visit.  Subjective Assessment - 06/26/19 1519    Subjective  Pt report she can sit a little longer since previous session.  Pain is decreased down to about 4/10.    Currently in Pain?  Yes    Pain Score  4     Pain Location  Back    Pain Orientation  Mid;Lower;Upper                       [facility] Adult PT Treatment/Exercise - 06/26/19 0001      Lumbar Exercises: Supine   Other Supine Lumbar Exercises  thoracic extension in supine with towel roll, in chair with pillow behind back - 10 x each way    Other Supine Lumbar Exercises  shoulder rolls backwards in standing with cues both verbal and tactile for posture and cervical reatraction - "lift the heart" - 20 x      Manual Therapy   Joint Mobilization  grade III-IV mobs to T4-8 P-A direction, sacral distraction    Soft tissue mobilization  lumbar/thoracic paraspinlas and gluteal Rt>Lt, Rt infraspinatus and teres               PT Short Term Goals - 06/17/19 1720      PT SHORT TERM  GOAL #1   Title  ind with initial HEP    Time  4    Period  Weeks    Status  New    Target Date  07/15/19        PT Long Term Goals - 06/17/19 1623      PT LONG TERM GOAL #1   Title  Pt will be ind with advanced HEP    Time  8    Period  Weeks    Status  New    Target Date  08/12/19      PT LONG TERM GOAL #2   Title  Pt will report 50% reduced pain    Time  8    Period  Weeks    Status  New    Target Date  08/12/19      PT LONG TERM GOAL #3   Title  Pt will be able to demonstrate forward flexion of lumbar spine with hands reaching to knees in order to pick up items off the floor more easily    Time  8    Period  Weeks    Status  New    Target Date  08/12/19      PT LONG TERM GOAL #4   Title  FOTO </= to 49% limited    Time  8    Period  Weeks    Status  New    Target Date   08/12/19            Plan - 06/26/19 1533    Clinical Impression Statement  Pt is doing better with pain levels and is pleased with her progress so far.  Today's session focused more on posture than previously now that her pain is more under control.  She does still have very hypomobile mid thoracic region and a lot of muscle spasms that required STM techniques . She will benefit from skilled PT to continue manual for improved soft tissue length and work on posture strength to maintain improvements.    PT Treatment/Interventions  ADLs/Self Care Home Management;Aquatic Therapy;Traction;Moist Heat;Electrical Stimulation;Cryotherapy;Biofeedback;Gait training;Stair training;Therapeutic activities;Therapeutic exercise;Neuromuscular re-education;Patient/family education;Manual techniques;Passive range of motion;Dry needling;Taping    PT Next Visit Plan  STM to Rt periscapular muscles, upper traps, scalenes, gentle core and hip strength for posture    PT Home Exercise Plan  Access Code: [email or web address]       Patient will benefit from skilled therapeutic intervention in order to improve the following deficits and impairments:  Decreased coordination, Pain, Increased fascial restricitons, Decreased strength, Decreased activity tolerance, Postural dysfunction, Increased muscle spasms, Abnormal gait  Visit Diagnosis: 1. Chronic bilateral low back pain with bilateral sciatica   2. Cervicalgia   3. Muscle weakness (generalized)   4. Unspecified lack of coordination        Problem List Patient Active Problem List   Diagnosis Date Noted  . Paroxysmal atrial fibrillation ([place]) 02/08/2017  . Anxiety 03/30/2016  . Osteopenia 01/27/2016  . Atrial tachycardia ([place]) 11/04/2015  . 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 Palpitations 05/13/2014  . Abnormal nuclear stress test 02/04/2014  . Essential hypertension 01/14/2014  . Chest pressure 01/14/2014  . Edema of foot 01/14/2014    Jule Ser, PT 06/26/2019, 3:36 PM  [facility] [address], [place], [place], [address] Phone: [phone]   Fax:  [phone]  Name: Kelsey James MRN: [id number] Date of Birth: October 16, 1941

## 2019-07-01 ENCOUNTER — Ambulatory Visit: Payer: Medicare Other | Admitting: Physical Therapy

## 2019-07-01 ENCOUNTER — Other Ambulatory Visit: Payer: Self-pay

## 2019-07-01 ENCOUNTER — Encounter: Payer: Self-pay | Admitting: Physical Therapy

## 2019-07-01 DIAGNOSIS — M5442 Lumbago with sciatica, left side: Secondary | ICD-10-CM

## 2019-07-01 DIAGNOSIS — M6281 Muscle weakness (generalized): Secondary | ICD-10-CM

## 2019-07-01 DIAGNOSIS — G8929 Other chronic pain: Secondary | ICD-10-CM

## 2019-07-01 DIAGNOSIS — R279 Unspecified lack of coordination: Secondary | ICD-10-CM

## 2019-07-01 DIAGNOSIS — M542 Cervicalgia: Secondary | ICD-10-CM

## 2019-07-01 NOTE — Therapy (Signed)
[facility] [address] [place], [place], [address] Phone: [phone]   Fax:  [phone]  Physical Therapy Treatment  Patient Details  Name: Kelsey James MRN: [id number] Date of Birth: 1941-08-16 Referring Provider (PT): Joya Gaskins, [place]   Encounter Date: 07/01/2019  PT End of Session - 07/01/19 1437    Visit Number  5    Date for PT Re-Evaluation  08/12/19    PT Start Time  1401    PT Stop Time  1446    PT Time Calculation (min)  45 min    Activity Tolerance  Patient tolerated treatment well;Patient limited by pain    Behavior During Therapy  [facility] for tasks assessed/performed       Past Medical History:  Diagnosis Date  . A-fib ([place])   . Arthritis    "hands" (11/04/2015)  . Dry eyes   . Glaucoma   . Osteopenia   . Rosacea   . Wears glasses     Past Surgical History:  Procedure Laterality Date  . ATRIAL TACH ABLATION  11/04/2015  . COLONOSCOPY    . ELECTROPHYSIOLOGIC STUDY N/A 11/04/2015   Procedure: A-Tach Ablation;  Surgeon: Will [name], MD;  Location: [place] [facility];  Service: Cardiovascular;  Laterality: N/A;  . LEFT HEART CATHETERIZATION WITH CORONARY ANGIOGRAM N/A 03/12/2014   Procedure: LEFT HEART CATHETERIZATION WITH CORONARY ANGIOGRAM;  Surgeon: Pixie Casino, MD;  [facility];  Service: Cardiovascular;  Laterality: N/A;  . LOOP RECORDER IMPLANT N/A 09/15/2014   Procedure: LOOP RECORDER IMPLANT;  Surgeon: Deboraha Sprang, MD;  Location: [facility] CATH LAB;  Service: Cardiovascular;  Laterality: N/A;  . LOOP RECORDER REMOVAL N/A 09/26/2017   Procedure: LOOP RECORDER REMOVAL;  Surgeon: Constance Haw, MD;  Location: [place] [facility];  Service: Cardiovascular;  Laterality: N/A;  . MASS EXCISION Right 09/25/2014   Procedure: RIGHT RING FINGER EXCISION MASS AND DEBRIDEMENT DIP JOINT;  Surgeon: Leanora Cover, MD;  Location: [place];  Service:  Orthopedics;  Laterality: Right;  . TONSILLECTOMY AND ADENOIDECTOMY  1979  . TUBAL LIGATION      There were no vitals filed for this visit.  Subjective Assessment - 07/01/19 1450    Subjective  Pt states she has still been walking.  It still hurts when sitting and lying down in bed.  She reports it is not as painful as when she started but it is worse than immediately after last treatment.    Currently in Pain?  Yes    Pain Score  6     Pain Location  Back    Pain Orientation  Lower;Mid    Multiple Pain Sites  No                       [facility] Adult PT Treatment/Exercise - 07/01/19 0001      Lumbar Exercises: Sidelying   Other Sidelying Lumbar Exercises  thoracic rotatoin - 5 x 10 sec      Modalities   Modalities  Moist Heat;Electrical Stimulation      Electrical Stimulation   Electrical Stimulation Location  sacrum/coccyx    Electrical Stimulation Action  IFC    Electrical Stimulation Parameters  to tolerance x 15 min    Electrical Stimulation Goals  Pain      Manual Therapy   Soft tissue mobilization  lumbar/thoracic paraspinlas and gluteal Rt>Lt, Rt infraspinatus and teres  Trigger Point Dry Needling - 07/01/19 0001    Consent Given?  Yes    Education Handout Provided  Yes    Muscles Treated Back/Hip  Lumbar multifidi           PT Education - 07/01/19 1449    Education Details  Access Code: [email or web address] and dry needling info    Person(s) Educated  Patient    Methods  Explanation;Demonstration;Handout;Verbal cues    Comprehension  Verbalized understanding;Returned demonstration       PT Short Term Goals - 06/17/19 1720      PT SHORT TERM GOAL #1   Title  ind with initial HEP    Time  4    Period  Weeks    Status  New    Target Date  07/15/19        PT Long Term Goals - 06/17/19 1623      PT LONG TERM GOAL #1   Title  Pt will be ind with advanced HEP    Time  8    Period  Weeks    Status  New    Target Date  08/12/19      PT  LONG TERM GOAL #2   Title  Pt will report 50% reduced pain    Time  8    Period  Weeks    Status  New    Target Date  08/12/19      PT LONG TERM GOAL #3   Title  Pt will be able to demonstrate forward flexion of lumbar spine with hands reaching to knees in order to pick up items off the floor more easily    Time  8    Period  Weeks    Status  New    Target Date  08/12/19      PT LONG TERM GOAL #4   Title  FOTO </= to 49% limited    Time  8    Period  Weeks    Status  New    Target Date  08/12/19            Plan - 07/01/19 1438    Clinical Impression Statement  Pt is still having pain in her tailbone.  She feels like it came back since last visit, but not as bad as it was.  However, pt continues to express concern for tailbone.  Palpation of coccyx feels like there is excessive flexion.  She may benefit from imaging of pelvis and/or lumbar region to rule out any fracture or alignment issues.  Pt had release of soft tissue with dry needling and STM techniques.  She will benefit from skilled PT to progress posutre and strength as able.    PT Treatment/Interventions  ADLs/Self Care Home Management;Aquatic Therapy;Traction;Moist Heat;Electrical Stimulation;Cryotherapy;Biofeedback;Gait training;Stair training;Therapeutic activities;Therapeutic exercise;Neuromuscular re-education;Patient/family education;Manual techniques;Passive range of motion;Dry needling;Taping    PT Next Visit Plan  fu on DN #1; STM to Rt periscapular muscles, upper traps, scalenes, gentle core and hip strength for posture    PT Home Exercise Plan  Access Code: [email or web address]    Recommended Other Services  assess lumbar/sacral/coccyx alignment    Consulted and Agree with Plan of Care  Patient       Patient will benefit from skilled therapeutic intervention in order to improve the following deficits and impairments:  Decreased coordination, Pain, Increased fascial restricitons, Decreased strength, Decreased activity  tolerance, Postural dysfunction, Increased muscle spasms, Abnormal gait  Visit Diagnosis: 1. Chronic bilateral low back pain  with bilateral sciatica   2. Cervicalgia   3. Muscle weakness (generalized)   4. Unspecified lack of coordination        Problem List Patient Active Problem List   Diagnosis Date Noted  . Paroxysmal atrial fibrillation ([place]) 02/08/2017  . Anxiety 03/30/2016  . Osteopenia 01/27/2016  . Atrial tachycardia ([place]) 11/04/2015  . 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 Palpitations 05/13/2014  . Abnormal nuclear stress test 02/04/2014  . Essential hypertension 01/14/2014  . Chest pressure 01/14/2014  . Edema of foot 01/14/2014    Jule Ser, PT 07/01/2019, 3:54 PM  [facility] [address] [place], [place], [address] Phone: [phone]   Fax:  [phone]  Name: Kelsey James MRN: [id number] Date of Birth: 03-Feb-1941

## 2019-07-01 NOTE — Patient Instructions (Addendum)

## 2019-07-02 ENCOUNTER — Other Ambulatory Visit: Payer: Self-pay

## 2019-07-02 ENCOUNTER — Ambulatory Visit (INDEPENDENT_AMBULATORY_CARE_PROVIDER_SITE_OTHER): Payer: Medicare Other | Admitting: Cardiology

## 2019-07-02 ENCOUNTER — Encounter: Payer: Self-pay | Admitting: Cardiology

## 2019-07-02 VITALS — BP 112/62 | HR 59 | Ht 64.0 in | Wt 121.0 lb

## 2019-07-02 DIAGNOSIS — E876 Hypokalemia: Secondary | ICD-10-CM

## 2019-07-02 DIAGNOSIS — I480 Paroxysmal atrial fibrillation: Secondary | ICD-10-CM

## 2019-07-02 LAB — BASIC METABOLIC PANEL
BUN/Creatinine Ratio: 17 (ref 12–28)
BUN: 8 mg/dL (ref 8–27)
CO2: 24 mmol/L (ref 20–29)
Calcium: 9.1 mg/dL (ref 8.7–10.3)
Chloride: 94 mmol/L — ABNORMAL LOW (ref 96–106)
Creatinine, Ser: 0.48 mg/dL — ABNORMAL LOW (ref 0.57–1.00)
GFR calc Af Amer: 110 mL/min/{1.73_m2} (ref >59)
GFR calc non Af Amer: 96 mL/min/{1.73_m2} (ref >59)
Glucose: 97 mg/dL (ref 65–99)
Potassium: 3.8 mmol/L (ref 3.5–5.2)
Sodium: 133 mmol/L — ABNORMAL LOW (ref 134–144)

## 2019-07-02 MED ORDER — APIXABAN 5 MG PO TABS: 5.0000 mg | ORAL_TABLET | Freq: Two times a day (BID) | ORAL | 11 refills | Status: DC

## 2019-07-02 NOTE — Patient Instructions (Addendum)
Medication Instructions:  Your physician has recommended you make the following change in your medication:  1. STOP Xarelto 2. START Eliquis - take  5 mg TWICE daily  * If you need a refill on your cardiac medications before your next appointment, please call your pharmacy.   Labwork: Today: BMET *We will only notify you of abnormal results, otherwise continue current treatment plan.  Testing/Procedures: None ordered  Follow-Up: Your physician wants you to follow-up in: 1 year with Dr. Curt Bears.  You will receive a reminder letter in the mail two months in advance. If you don't receive a letter, please call our office to schedule the follow-up appointment.  Thank you for choosing [facility]!!   Kelsey Curet, RN [phone]  Any Other Special Instructions Will Be Listed Below (If Applicable).  Apixaban oral tablets What is this medicine? APIXABAN (a PIX a ban) is an anticoagulant (blood thinner). It is used to lower the chance of stroke in people with a medical condition called atrial fibrillation. It is also used to treat or prevent blood clots in the lungs or in the veins. This medicine may be used for other purposes; ask your health care provider or pharmacist if you have questions. COMMON BRAND NAME(S): Eliquis What should I tell my health care provider before I take this medicine? They need to know if you have any of these conditions:  antiphospholipid antibody syndrome  bleeding disorders  bleeding in the brain  blood in your stools (black or tarry stools) or if you have blood in your vomit  history of blood clots  history of stomach bleeding  kidney disease  liver disease  mechanical heart valve  an unusual or allergic reaction to apixaban, other medicines, foods, dyes, or preservatives  pregnant or trying to get pregnant  breast-feeding How should I use this medicine? Take this medicine by mouth with a glass of water. Follow the directions on the  prescription label. You can take it with or without food. If it upsets your stomach, take it with food. Take your medicine at regular intervals. Do not take it more often than directed. Do not stop taking except on your doctor's advice. Stopping this medicine may increase your risk of a blood clot. Be sure to refill your prescription before you run out of medicine. Talk to your pediatrician regarding the use of this medicine in children. Special care may be needed. Overdosage: If you think you have taken too much of this medicine contact a poison control center or emergency room at once. NOTE: This medicine is only for you. Do not share this medicine with others. What if I miss a dose? If you miss a dose, take it as soon as you can. If it is almost time for your next dose, take only that dose. Do not take double or extra doses. What may interact with this medicine? This medicine may interact with the following:  aspirin and aspirin-like medicines  certain medicines for fungal infections like ketoconazole and itraconazole  certain medicines for seizures like carbamazepine and phenytoin  certain medicines that treat or prevent blood clots like warfarin, enoxaparin, and dalteparin  clarithromycin  NSAIDs, medicines for pain and inflammation, like ibuprofen or naproxen  rifampin  ritonavir  St. John's wort This list may not describe all possible interactions. Give your health care provider a list of all the medicines, herbs, non-prescription drugs, or dietary supplements you use. Also tell them if you smoke, drink alcohol, or use illegal drugs. Some  items may interact with your medicine. What should I watch for while using this medicine? Visit your healthcare professional for regular checks on your progress. You may need blood work done while you are taking this medicine. Your condition will be monitored carefully while you are receiving this medicine. It is important not to miss any  appointments. Avoid sports and activities that might cause injury while you are using this medicine. Severe falls or injuries can cause unseen bleeding. Be careful when using sharp tools or knives. Consider using an Copy. Take special care brushing or flossing your teeth. Report any injuries, bruising, or red spots on the skin to your healthcare professional. If you are going to need surgery or other procedure, tell your healthcare professional that you are taking this medicine. Wear a medical ID bracelet or chain. Carry a card that describes your disease and details of your medicine and dosage times. What side effects may I notice from receiving this medicine? Side effects that you should report to your doctor or health care professional as soon as possible:  allergic reactions like skin rash, itching or hives, swelling of the face, lips, or tongue  signs and symptoms of bleeding such as bloody or black, tarry stools; red or dark-brown urine; spitting up blood or brown material that looks like coffee grounds; red spots on the skin; unusual bruising or bleeding from the eye, gums, or nose  signs and symptoms of a blood clot such as chest pain; shortness of breath; pain, swelling, or warmth in the leg  signs and symptoms of a stroke such as changes in vision; confusion; trouble speaking or understanding; severe headaches; sudden numbness or weakness of the face, arm or leg; trouble walking; dizziness; loss of coordination This list may not describe all possible side effects. Call your doctor for medical advice about side effects. You may report side effects to FDA at 1-800-FDA-1088. Where should I keep my medicine? Keep out of the reach of children. Store at room temperature between 20 and 25 degrees C (68 and 77 degrees F). Throw away any unused medicine after the expiration date. NOTE: This sheet is a summary. It may not cover all possible information. If you have questions about this  medicine, talk to your doctor, pharmacist, or health care provider.  2020 Elsevier/Gold Standard (2018-07-11 17:39:34)

## 2019-07-02 NOTE — Progress Notes (Signed)
Electrophysiology Office Note   Date:  07/02/2019   ID:  Kelsey James, DOB 14-Jun-1941, MRN [id number]  PCP:  [place]  Cardiologist:  Debara Pickett Primary Electrophysiologist:  Tenea Sens [name], MD    No chief complaint on file.    History of Present Illness: Kelsey James is a 78 y.o. female who presents today for electrophysiology evaluation.  She has history of atrial tachycardia status post ablation on 01/04/15. Found to have atrial fibrillation on her Linq monitor on 08/29/16.   Today, denies symptoms of palpitations, chest pain, shortness of breath, orthopnea, PND, lower extremity edema, claudication, dizziness, presyncope, syncope, bleeding, or neurologic sequela. The patient is tolerating medications without difficulties.  Fortunately, she had a fall and bruised her leg.  She is currently on Xarelto and feels that the Xarelto is complicating her bruising.  She is having quite a bit of pain which has changed the way that she is able to walk and now has pain in her back.  Past Medical History:  Diagnosis Date  . A-fib ([place])   . Arthritis    "hands" (11/04/2015)  . Dry eyes   . Glaucoma   . Osteopenia   . Rosacea   . Wears glasses    Past Surgical History:  Procedure Laterality Date  . ATRIAL TACH ABLATION  11/04/2015  . COLONOSCOPY    . ELECTROPHYSIOLOGIC STUDY N/A 11/04/2015   Procedure: A-Tach Ablation;  Surgeon: Annalie Wenner [name], MD;  Location: [facility];  Service: Cardiovascular;  Laterality: N/A;  . LEFT HEART CATHETERIZATION WITH CORONARY ANGIOGRAM N/A 03/12/2014   Procedure: LEFT HEART CATHETERIZATION WITH CORONARY ANGIOGRAM;  Surgeon: Pixie Casino, MD;  Location: [facility] CATH LAB;  Service: Cardiovascular;  Laterality: N/A;  . LOOP RECORDER IMPLANT N/A 09/15/2014   Procedure: LOOP RECORDER IMPLANT;  Surgeon: Deboraha Sprang, MD;  Location: [facility] CATH LAB;  Service: Cardiovascular;  Laterality: N/A;  . LOOP RECORDER REMOVAL N/A  09/26/2017   Procedure: LOOP RECORDER REMOVAL;  Surgeon: Constance Haw, MD;  Location: [facility];  Service: Cardiovascular;  Laterality: N/A;  . MASS EXCISION Right 09/25/2014   Procedure: RIGHT RING FINGER EXCISION MASS AND DEBRIDEMENT DIP JOINT;  Surgeon: Leanora Cover, MD;  Location: [facility];  Service: Orthopedics;  Laterality: Right;  . TONSILLECTOMY AND ADENOIDECTOMY  1979  . TUBAL LIGATION       Current Outpatient Medications  Medication Sig Dispense Refill  . acetaminophen (TYLENOL) 500 MG tablet Take 500 mg every 6 (six) hours as needed by mouth (pain).     Marland Kitchen ALPRAZolam (XANAX) 0.25 MG tablet TAKE 1 TABLET BY MOUTH AT BEDTIME AS NEEDED FOR ANXIETY 30 tablet 0  . amLODipine (NORVASC) 5 MG tablet TAKE 1.5 TABLETS BY MOUTH DAILY 135 tablet 2  . Calcium-Magnesium (CALCIUM MAGNESIUM 750) 300-300 MG TABS Take 1 tablet by mouth 2 (two) times daily.     . chlorthalidone (HYGROTON) 25 MG tablet TAKE 0.5 TABLETS (12.5 MG TOTAL) BY MOUTH DAILY. 45 tablet 3  . hydroxypropyl methylcellulose (ISOPTO TEARS) 2.5 % ophthalmic solution Place 1 drop into both eyes as needed for dry eyes.    Marland Kitchen KLOR-CON M20 20 MEQ tablet TAKE 1 TABLET (20 MEQ TOTAL) BY MOUTH DAILY AS NEEDED. 90 tablet 1  . LECITHIN PO Take 1 capsule 2 (two) times daily by mouth.    . Omega-3 Fatty Acids (FISH OIL PO) Take 1 capsule by mouth 2 (two) times daily.     Marland Kitchen  omeprazole (PRILOSEC) 40 MG capsule Take 40 mg by mouth daily.    Marland Kitchen OVER THE COUNTER MEDICATION Take 1 capsule 2 (two) times daily by mouth. Florasil Supllement    . OVER THE COUNTER MEDICATION Take 1 capsule 2 (two) times daily by mouth. Bioastin Supplement    . travoprost, benzalkonium, (TRAVATAN) 0.004 % ophthalmic solution Place 1 drop at bedtime into both eyes.     Alveda Reasons 20 MG TABS tablet TAKE 1 TABLET BY MOUTH EVERY DAY WITH SUPPER 30 tablet 1   No current facility-administered medications for this visit.     Allergies:    Dorzolamide hcl-timolol mal, Hydrocortisone, Other, and Shellfish allergy   Social History:  The patient  reports that she quit smoking about 16 years ago. Her smoking use included cigarettes. She has a 12.50 pack-year smoking history. She has never used smokeless tobacco. She reports current alcohol use of about 7.0 standard drinks of alcohol per week. She reports that she does not use drugs.   Family History:  The patient's family history includes Cancer in her maternal grandmother; Depression in her father; Valvular heart disease in her mother.   ROS:  Please see the history of present illness.   Otherwise, review of systems is positive for none.   All other systems are reviewed and negative.   PHYSICAL EXAM: VS:  BP 112/62   Pulse (!) 59   Ht 5\' 4"  (1.626 m)   Wt 121 lb (54.9 kg)   SpO2 98%   BMI 20.77 kg/m  , BMI Body mass index is 20.77 kg/m. GEN: Well nourished, well developed, in no acute distress  HEENT: normal  Neck: no JVD, carotid bruits, or masses Cardiac: RRR; no murmurs, rubs, or gallops,no edema  Respiratory:  clear to auscultation bilaterally, normal work of breathing GI: soft, nontender, nondistended, + BS MS: no deformity or atrophy  Skin: warm and dry Neuro:  Strength and sensation are intact Psych: euthymic mood, full affect  EKG:  EKG is ordered today. Personal review of the ekg ordered shows sinus rhythm   Recent Labs: 05/31/2019: BUN 8; Creatinine, Ser 0.45; Hemoglobin 12.9; Platelets 280; Potassium 2.9; Sodium 134    Lipid Panel     Component Value Date/Time   CHOL 172 12/19/2017 1435   TRIG 68 12/19/2017 1435   HDL 88 12/19/2017 1435   CHOLHDL 2.0 12/19/2017 1435   LDLCALC 70 12/19/2017 1435     Wt Readings from Last 3 Encounters:  07/02/19 121 lb (54.9 kg)  05/31/19 120 lb (54.4 kg)  08/28/18 122 lb 9.6 oz (55.6 kg)    ASSESSMENT AND PLAN:  1.  Atrial tachycardia: Status post ablation at 1:00 on the tricuspid valve.  Has had no further  tachycardias.  2. Hypertension: well controlled  3.  Paroxysmal atrial fibrillation: Currently on Xarelto.  Short-lived palpitations.  Does not wish to be on antiarrhythmics.  She has had a fall and feels that the Xarelto is complicating her healing.  We Sheela Mcculley switch her to Eliquis.  This patients CHA2DS2-VASc Score and unadjusted Ischemic Stroke Rate (% per year) is equal to 3.2 % stroke rate/year from a score of 3  Above score calculated as 1 point each if present [CHF, HTN, DM, Vascular=MI/PAD/Aortic Plaque, Age if 65-74, or Female] Above score calculated as 2 points each if present [Age > 75, or Stroke/TIA/TE]   Current medicines are reviewed at length with the patient today.   The patient does not have concerns regarding her  medicines.  The following changes were made today: Stop Xarelto, start Eliquis  Labs/ tests ordered today include:   Orders Placed This Encounter  Procedures  . EKG 12-Lead     Disposition:   FU with Rondrick Barreira 12 months  Signed, Quanisha Drewry [name], MD  07/02/2019 12:03 PM     [place] [address] [place] [address] [phone] (office) [phone] (fax)

## 2019-07-03 ENCOUNTER — Telehealth: Payer: Self-pay

## 2019-07-03 ENCOUNTER — Encounter: Payer: Medicare Other | Admitting: Physical Therapy

## 2019-07-03 NOTE — Telephone Encounter (Signed)
The patient has been notified of the result and verbalized understanding.  All questions (if any) were answered. Wilma Flavin, RN 07/03/2019 12:00 PM

## 2019-07-04 ENCOUNTER — Ambulatory Visit: Payer: Medicare Other | Admitting: Physical Therapy

## 2019-07-04 ENCOUNTER — Other Ambulatory Visit: Payer: Self-pay

## 2019-07-04 ENCOUNTER — Encounter: Payer: Self-pay | Admitting: Physical Therapy

## 2019-07-04 DIAGNOSIS — G8929 Other chronic pain: Secondary | ICD-10-CM

## 2019-07-04 DIAGNOSIS — R279 Unspecified lack of coordination: Secondary | ICD-10-CM

## 2019-07-04 DIAGNOSIS — M5442 Lumbago with sciatica, left side: Secondary | ICD-10-CM

## 2019-07-04 DIAGNOSIS — M542 Cervicalgia: Secondary | ICD-10-CM

## 2019-07-04 DIAGNOSIS — M6281 Muscle weakness (generalized): Secondary | ICD-10-CM

## 2019-07-04 NOTE — Therapy (Signed)
[facility] [address], [place], [place], [address] Phone: [phone]   Fax:  [phone]  Physical Therapy Treatment  Patient Details  Name: Kelsey James MRN: [id number] Date of Birth: 07-25-1941 Referring Provider (PT): Joya Gaskins, [place]   Encounter Date: 07/04/2019  PT End of Session - 07/04/19 1301    Visit Number  6    Date for PT Re-Evaluation  08/12/19    PT Start Time  3825    PT Stop Time  1320    PT Time Calculation (min)  47 min    Activity Tolerance  Patient tolerated treatment well;Patient limited by pain    Behavior During Therapy  [facility] for tasks assessed/performed       Past Medical History:  Diagnosis Date  . A-fib ([place])   . Arthritis    "hands" (11/04/2015)  . Dry eyes   . Glaucoma   . Osteopenia   . Rosacea   . Wears glasses     Past Surgical History:  Procedure Laterality Date  . ATRIAL TACH ABLATION  11/04/2015  . COLONOSCOPY    . ELECTROPHYSIOLOGIC STUDY N/A 11/04/2015   Procedure: A-Tach Ablation;  Surgeon: Will [name], MD;  Location: [facility];  Service: Cardiovascular;  Laterality: N/A;  . LEFT HEART CATHETERIZATION WITH CORONARY ANGIOGRAM N/A 03/12/2014   Procedure: LEFT HEART CATHETERIZATION WITH CORONARY ANGIOGRAM;  Surgeon: Pixie Casino, MD;  Location: [facility] CATH LAB;  Service: Cardiovascular;  Laterality: N/A;  . LOOP RECORDER IMPLANT N/A 09/15/2014   Procedure: LOOP RECORDER IMPLANT;  Surgeon: Deboraha Sprang, MD;  Location: [facility] CATH LAB;  Service: Cardiovascular;  Laterality: N/A;  . LOOP RECORDER REMOVAL N/A 09/26/2017   Procedure: LOOP RECORDER REMOVAL;  Surgeon: Constance Haw, MD;  Location: [facility] [facility] [place] [facility];  Service: Cardiovascular;  Laterality: N/A;  . MASS EXCISION Right 09/25/2014   Procedure: RIGHT RING FINGER EXCISION MASS AND DEBRIDEMENT DIP JOINT;  Surgeon: Leanora Cover, MD;  Location: [facility];  Service:  Orthopedics;  Laterality: Right;  . TONSILLECTOMY AND ADENOIDECTOMY  1979  . TUBAL LIGATION      There were no vitals filed for this visit.  Subjective Assessment - 07/04/19 1303    Subjective  Pt states she is overall 40% better.  States the Lt leg had a hematoma and hasn't recovered and now feels like the toe is turning in and walking differently.    Currently in Pain?  Yes    Pain Score  7     Pain Location  Coccyx    Pain Orientation  Lower;Mid    Pain Descriptors / Indicators  Aching    Pain Radiating Towards  4/10 into shoulders    Pain Onset  More than a month ago    Pain Frequency  Intermittent    Aggravating Factors   sitting and pulling things out of the dishwasher    Pain Relieving Factors  tylenol    Multiple Pain Sites  No                       [facility] Adult PT Treatment/Exercise - 07/04/19 0001      Lumbar Exercises: Supine   Other Supine Lumbar Exercises  pec stretch with towel roll in verticle position - 10x 10 sec hold      Moist Heat Therapy   Number Minutes Moist Heat  30 Minutes   15x upper back, 15  low back     Tree surgeon Action  IFC    Electrical Stimulation Parameters  to tolerance x 15 min    Electrical Stimulation Goals  Pain      Manual Therapy   Soft tissue mobilization  lumbar/thoracic paraspinlas and gluteal Rt>Lt, Rt infraspinatus and teres, upper trap             PT Education - 07/04/19 1317    Education Details  Access Code: [email or web address]    Person(s) Educated  Patient    Methods  Explanation;Demonstration;Handout;Verbal cues    Comprehension  Verbalized understanding;Returned demonstration       PT Short Term Goals - 07/04/19 1302      PT SHORT TERM GOAL #1   Title  ind with initial HEP    Status  Achieved        PT Long Term Goals - 07/04/19 1302      PT LONG TERM GOAL #1   Title  Pt will be ind with advanced HEP     Status  On-going      PT LONG TERM GOAL #2   Title  Pt will report 50% reduced pain    Status  On-going            Plan - 07/04/19 1307    Clinical Impression Statement  Pt states her tailbone is very aggravated and became worse today before coming to PT.  She did feel like the DN last time helped a lot and she is 40% better overall.  Pt is able to sit for brief periods during today's session.  Overall today's session focued on stretching pecs for improved posture and soft tissue release to gluteals and lumbar paraspinals Rt>Lt.  Pt will benefit from skilled PT to work on strengthen for improved posture.    PT Treatment/Interventions  ADLs/Self Care Home Management;Aquatic Therapy;Traction;Moist Heat;Electrical Stimulation;Cryotherapy;Biofeedback;Gait training;Stair training;Therapeutic activities;Therapeutic exercise;Neuromuscular re-education;Patient/family education;Manual techniques;Passive range of motion;Dry needling;Taping    PT Next Visit Plan  pt asked about not straining to have BM - review this first, DN #2 lumbar paraspinals and gluteal Rt>Lt; STM to Rt periscapular muscles, upper traps, scalenes, gentle core, hip, and mid/low trap strength for posture, thoracic extension    PT Home Exercise Plan  Access Code: [email or web address]    Consulted and Agree with Plan of Care  Patient       Patient will benefit from skilled therapeutic intervention in order to improve the following deficits and impairments:  Decreased coordination, Pain, Increased fascial restricitons, Decreased strength, Decreased activity tolerance, Postural dysfunction, Increased muscle spasms, Abnormal gait  Visit Diagnosis: Chronic bilateral low back pain with bilateral sciatica  Cervicalgia  Muscle weakness (generalized)  Unspecified lack of coordination     Problem List Patient Active Problem List   Diagnosis Date Noted  . Paroxysmal atrial fibrillation ([place]) 02/08/2017  . Anxiety 03/30/2016  . Osteopenia  01/27/2016  . Atrial tachycardia ([place]) 11/04/2015  . 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 Palpitations 05/13/2014  . Abnormal nuclear stress test 02/04/2014  . Essential hypertension 01/14/2014  . Chest pressure 01/14/2014  . Edema of foot 01/14/2014    Jule Ser, PT 07/04/2019, 1:24 PM  [facility] [address], [place], [place], [address] Phone: [phone]   Fax:  [phone]  Name: Kelsey James MRN: [id number] Date of Birth: 06/18/1941

## 2019-07-04 NOTE — Patient Instructions (Signed)
Access Code: [email or web address]  URL: [email or web address]  Date: 07/04/2019  Prepared by: Jari Favre   Exercises  Supine Diaphragmatic Breathing with Pelvic Floor Lengthening - 10 reps - 1 sets - 3x daily - 7x weekly  Seated Thoracic Lumbar Extension with Pectoralis Stretch - 10 reps - 3 sets - 1x daily - 7x weekly  Sidelying Thoracic and Shoulder Rotation - 10 reps - 1 sets - 5 sec hold - 1x daily - 7x weekly  Open Book Chest Stretch on Towel Roll - 3 reps - 1 sets - 30 sec hold - 1x daily - 7x weekly

## 2019-07-08 ENCOUNTER — Other Ambulatory Visit: Payer: Self-pay

## 2019-07-08 ENCOUNTER — Ambulatory Visit: Payer: Medicare Other | Admitting: Physical Therapy

## 2019-07-08 ENCOUNTER — Encounter: Payer: Self-pay | Admitting: Physical Therapy

## 2019-07-08 DIAGNOSIS — R252 Cramp and spasm: Secondary | ICD-10-CM

## 2019-07-08 DIAGNOSIS — M5442 Lumbago with sciatica, left side: Secondary | ICD-10-CM

## 2019-07-08 DIAGNOSIS — G4709 Other insomnia: Secondary | ICD-10-CM

## 2019-07-08 DIAGNOSIS — G8929 Other chronic pain: Secondary | ICD-10-CM

## 2019-07-08 DIAGNOSIS — M6281 Muscle weakness (generalized): Secondary | ICD-10-CM

## 2019-07-08 MED ORDER — ALPRAZOLAM 0.25 MG PO TABS: ORAL_TABLET | ORAL | 0 refills | Status: DC

## 2019-07-08 NOTE — Therapy (Signed)
[facility] [address] [place], [place], [address] Phone: [phone]   Fax:  [phone]  Physical Therapy Treatment  Patient Details  Name: Kelsey James MRN: [id number] Date of Birth: [birth year] Referring Provider (PT): Joya Gaskins, [place]   Encounter Date: 07/08/2019  PT End of Session - 07/08/19 1511    Visit Number  7    Date for PT Re-Evaluation  08/12/19    PT Start Time  [id number]    PT Stop Time  1500    PT Time Calculation (min)  40 min    Activity Tolerance  Patient tolerated treatment well    Behavior During Therapy  [facility] for tasks assessed/performed       Past Medical History:  Diagnosis Date  . A-fib ([place])   . Arthritis    "hands" (11/04/2015)  . Dry eyes   . Glaucoma   . Osteopenia   . Rosacea   . Wears glasses     Past Surgical History:  Procedure Laterality Date  . ATRIAL TACH ABLATION  11/04/2015  . COLONOSCOPY    . ELECTROPHYSIOLOGIC STUDY N/A 11/04/2015   Procedure: A-Tach Ablation;  Surgeon: Will [name], MD;  Location: [facility];  Service: Cardiovascular;  Laterality: N/A;  . LEFT HEART CATHETERIZATION WITH CORONARY ANGIOGRAM N/A 03/12/2014   Procedure: LEFT HEART CATHETERIZATION WITH CORONARY ANGIOGRAM;  Surgeon: Pixie Casino, MD;  Location: [facility] CATH LAB;  Service: Cardiovascular;  Laterality: N/A;  . LOOP RECORDER IMPLANT N/A 09/15/2014   Procedure: LOOP RECORDER IMPLANT;  Surgeon: Deboraha Sprang, MD;  Location: [facility] CATH LAB;  Service: Cardiovascular;  Laterality: N/A;  . LOOP RECORDER REMOVAL N/A 09/26/2017   Procedure: LOOP RECORDER REMOVAL;  Surgeon: Constance Haw, MD;  Location: [place];  Service: Cardiovascular;  Laterality: N/A;  . MASS EXCISION Right 09/25/2014   Procedure: RIGHT RING FINGER EXCISION MASS AND DEBRIDEMENT DIP JOINT;  Surgeon: Leanora Cover, MD;  Location: [facility];  Service: Orthopedics;  Laterality:  Right;  . TONSILLECTOMY AND ADENOIDECTOMY  1979  . TUBAL LIGATION      There were no vitals filed for this visit.                    [facility] Adult PT Treatment/Exercise - 07/08/19 0001      Self-Care   Self-Care  Other Self-Care Comments    Other Self-Care Comments   toileting positioning and breathing techniques to avoid straining, handout given      Manual Therapy   Manual Therapy  Soft tissue mobilization;Manual Traction    Soft tissue mobilization  lumbar multifidi bil, bil gluteals and piriformis, Rt sacral border inferior half through coccyx     Manual Traction  sacral distraction in prone Gr II/III       Trigger Point Dry Needling - 07/08/19 0001    Consent Given?  Yes    Education Handout Provided  Previously provided    Muscles Treated Back/Hip  Lumbar multifidi;Gluteus minimus;Gluteus medius;Piriformis   L4-S1 bil, hip on Rt only   Gluteus Minimus Response  Twitch response elicited;Palpable increased muscle length    Gluteus Medius Response  Twitch response elicited;Palpable increased muscle length    Piriformis Response  Twitch response elicited;Palpable increased muscle length    Lumbar multifidi Response  Twitch response elicited;Palpable increased muscle length           PT Education - 07/08/19 1511  Education Details  toileting techniques for defacation without straining    Person(s) Educated  Patient    Methods  Explanation;Demonstration;Verbal cues;Handout    Comprehension  Verbalized understanding;Returned demonstration       PT Short Term Goals - 07/04/19 1302      PT SHORT TERM GOAL #1   Title  ind with initial HEP    Status  Achieved        PT Long Term Goals - 07/04/19 1302      PT LONG TERM GOAL #1   Title  Pt will be ind with advanced HEP    Status  On-going      PT LONG TERM GOAL #2   Title  Pt will report 50% reduced pain    Status  On-going            Plan - 07/08/19 1512    Clinical Impression  Statement  Pt continues to have focal pain rated 8/10 in coccyx.  Other pain regions have reduced to 4/10.  PT focused on toileting education for defacation positioning and breathing to avoid straining.  Also discussed water intake.  Pt with good response and relief with deep pressure release techniques surrounding coccyx coupled with dry needling of lumbar and bil piriformis/gluteals today.  Pt reported relief of coccyx pain in sitting end of session.  Pt may benefit from further dry needling of pelvic floor surrounding infleunce of these tissues on coccyx position. Continue along current POC with careful assessment of symptoms and response to treatment.    Rehab Potential  Good    PT Frequency  2x / week    PT Duration  8 weeks    PT Treatment/Interventions  ADLs/Self Care Home Management;Aquatic Therapy;Traction;Moist Heat;Electrical Stimulation;Cryotherapy;Biofeedback;Gait training;Stair training;Therapeutic activities;Therapeutic exercise;Neuromuscular re-education;Patient/family education;Manual techniques;Passive range of motion;Dry needling;Taping    PT Next Visit Plan  f/u on toileting techniques, f/u on response to lumbar and Rt hip DN, consider needling Lt hip and pelvic floor, soft tissue mobs peri-coccyx region    PT Home Exercise Plan  Access Code: [email or web address]    Consulted and Agree with Plan of Care  Patient       Patient will benefit from skilled therapeutic intervention in order to improve the following deficits and impairments:     Visit Diagnosis: Chronic bilateral low back pain with bilateral sciatica  Muscle weakness (generalized)  Cramp and spasm     Problem List Patient Active Problem List   Diagnosis Date Noted  . Paroxysmal atrial fibrillation ([place]) 02/08/2017  . Anxiety 03/30/2016  . Osteopenia 01/27/2016  . Atrial tachycardia ([place]) 11/04/2015  . 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 Palpitations 05/13/2014  . Abnormal nuclear stress test 02/04/2014  .  Essential hypertension 01/14/2014  . Chest pressure 01/14/2014  . Edema of foot 01/14/2014    Baruch Merl, PT 07/08/19 3:18 PM   [facility] [address] [place], [place], [address] Phone: [phone]   Fax:  [phone]  Name: JONNAY MANDRACCHIA MRN: [id number] Date of Birth: 07-05-1941

## 2019-07-08 NOTE — Telephone Encounter (Signed)
Last CE 01/2018.

## 2019-07-08 NOTE — Telephone Encounter (Signed)
Please call and review to avoid etoh when taking, saw that she fainted in July, no more episodes? Any dizziness with xanax?.  Use sparingly, (which I think she does.) ok for refill if no dizziness with xanax.

## 2019-07-08 NOTE — Patient Instructions (Signed)
Toileting Techniques for Bowel Movements (Defecation) Using your belly (abdomen) and pelvic floor muscles to have a bowel movement is usually instinctive.  Sometimes people can have problems with these muscles and have to relearn proper defecation (emptying) techniques.  If you have weakness in your muscles, organs that are falling out, decreased sensation in your pelvis, or ignore your urge to go, you may find yourself straining to have a bowel movement.  You are straining if you are: . holding your breath or taking in a huge gulp of air and holding it  . keeping your lips and jaw tensed and closed tightly . turning red in the face because of excessive pushing or forcing . developing or worsening your  hemorrhoids . getting faint while pushing . not emptying completely and have to defecate many times a day  If you are straining, you are actually making it harder for yourself to have a bowel movement.  Many people find they are pulling up with the pelvic floor muscles and closing off instead of opening the anus. Due to lack pelvic floor relaxation and coordination the abdominal muscles, one has to work harder to push the feces out.  Many people have never been taught how to defecate efficiently and effectively.  Notice what happens to your body when you are having a bowel movement.  While you are sitting on the toilet pay attention to the following areas: . Jaw and mouth position . Angle of your hips   . Whether your feet touch the ground or not . Arm placement  . Spine position . Waist . Belly tension . Anus (opening of the anal canal)  An Evacuation/Defecation Plan   Here are the 4 basic points:  1. Lean forward enough for your elbows to rest on your knees 2. Support your feet on the floor or use a low stool if your feet don't touch the floor  3. Push out your belly as if you have swallowed a beach ball-you should feel a widening of your waist 4. Open and relax your pelvic floor muscles,  rather than tightening around the anus      The following conditions my require modifications to your toileting posture:  . If you have had surgery in the past that limits your back, hip, pelvic, knee or ankle flexibility . Constipation   Your healthcare practitioner may make the following additional suggestions and adjustments:  1) Sit on the toilet  a) Make sure your feet are supported. b) Notice your hip angle and spine position-most people find it effective to lean forward or raise their knees, which can help the muscles around the anus to relax  c) When you lean forward, place your forearms on your thighs for support  2) Relax suggestions a) Breath deeply in through your nose and out slowly through your mouth as if you are smelling the flowers and blowing out the candles. b) To become aware of how to relax your muscles, contracting and releasing muscles can be helpful.  Pull your pelvic floor muscles in tightly by using the image of holding back gas, or closing around the anus (visualize making a circle smaller) and lifting the anus up and in.  Then release the muscles and your anus should drop down and feel open. Repeat 5 times ending with the feeling of relaxation. c) Keep your pelvic floor muscles relaxed; let your belly bulge out. d) The digestive tract starts at the mouth and ends at the anal opening, so be   sure to relax both ends of the tube.  Place your tongue on the roof of your mouth with your teeth separated.  This helps relax your mouth and will help to relax the anus at the same time.  3) Empty (defecation) a) Keep your pelvic floor and sphincter relaxed, then bulge your anal muscles.  Make the anal opening wide.  b) Stick your belly out as if you have swallowed a beach ball. c) Make your belly wall hard using your belly muscles while continuing to breathe. Doing this makes it easier to open your anus. d) Breath out and give a grunt (or try using other sounds such as  ahhhh, shhhhh, ohhhh or grrrrrrr).  4) Finish a) As you finish your bowel movement, pull the pelvic floor muscles up and in.  This will leave your anus in the proper place rather than remaining pushed out and down. If you leave your anus pushed out and down, it will start to feel as though that is normal and give you incorrect signals about needing to have a bowel movement.    Cheryl Gray, PT [facility] [address] [place], [place] [address] 

## 2019-07-08 NOTE — Telephone Encounter (Signed)
Her voice mailbox is full and I am unable to leave a message.

## 2019-07-08 NOTE — Telephone Encounter (Signed)
Left message to call on cell phone voice mail.

## 2019-07-09 NOTE — Telephone Encounter (Signed)
Spoke with patient. She said she has been having terrible neck/back pain and that is what caused her to faint. No more fainting.  She said she falls asleep and sleeps a couple of hours and wakes up. When that happens she takes 1/2 a Xanax to help her fall back to sleep.  She also has natural supplement that helps. She does try not to take Xanax and uses it sparingly.  She is seeking help for her back/neck pain.  Rx called in .

## 2019-07-09 NOTE — Telephone Encounter (Signed)
Thank you for checking. 

## 2019-07-10 ENCOUNTER — Encounter: Payer: Medicare Other | Admitting: Physical Therapy

## 2019-07-11 ENCOUNTER — Ambulatory Visit: Payer: Medicare Other | Admitting: Physical Therapy

## 2019-07-11 ENCOUNTER — Other Ambulatory Visit: Payer: Self-pay

## 2019-07-11 ENCOUNTER — Encounter: Payer: Self-pay | Admitting: Physical Therapy

## 2019-07-11 DIAGNOSIS — M542 Cervicalgia: Secondary | ICD-10-CM

## 2019-07-11 DIAGNOSIS — M5442 Lumbago with sciatica, left side: Secondary | ICD-10-CM

## 2019-07-11 DIAGNOSIS — G8929 Other chronic pain: Secondary | ICD-10-CM

## 2019-07-11 DIAGNOSIS — R279 Unspecified lack of coordination: Secondary | ICD-10-CM

## 2019-07-11 DIAGNOSIS — R252 Cramp and spasm: Secondary | ICD-10-CM

## 2019-07-11 DIAGNOSIS — M6281 Muscle weakness (generalized): Secondary | ICD-10-CM

## 2019-07-11 NOTE — Patient Instructions (Signed)
Access Code: [email or web address]  URL: [email or web address]  Date: 07/11/2019  Prepared by: Jari Favre   Exercises  Supine Diaphragmatic Breathing with Pelvic Floor Lengthening - 10 reps - 1 sets - 3x daily - 7x weekly  Seated Thoracic Lumbar Extension with Pectoralis Stretch - 10 reps - 3 sets - 1x daily - 7x weekly  Sidelying Thoracic and Shoulder Rotation - 10 reps - 1 sets - 5 sec hold - 1x daily - 7x weekly  Supine Chin Tuck - 10 reps - 3 sets - 1x daily - 7x weekly

## 2019-07-11 NOTE — Therapy (Signed)
[facility] [address] [place], [place], [address] Phone: [phone]   Fax:  [phone]  Physical Therapy Treatment  Patient Details  Name: Kelsey James MRN: [id number] Date of Birth: 09-09-1941 Referring Provider (PT): Joya Gaskins, [place]   Encounter Date: 07/11/2019  PT End of Session - 07/11/19 1718    Visit Number  8    Date for PT Re-Evaluation  08/12/19    PT Start Time  [id number]    PT Stop Time  1457    PT Time Calculation (min)  42 min    Activity Tolerance  Patient tolerated treatment well    Behavior During Therapy  [facility] for tasks assessed/performed       Past Medical History:  Diagnosis Date  . A-fib ([place])   . Arthritis    "hands" (11/04/2015)  . Dry eyes   . Glaucoma   . Osteopenia   . Rosacea   . Wears glasses     Past Surgical History:  Procedure Laterality Date  . ATRIAL TACH ABLATION  11/04/2015  . COLONOSCOPY    . ELECTROPHYSIOLOGIC STUDY N/A 11/04/2015   Procedure: A-Tach Ablation;  Surgeon: Will [name], MD;  Location: [place] [facility];  Service: Cardiovascular;  Laterality: N/A;  . LEFT HEART CATHETERIZATION WITH CORONARY ANGIOGRAM N/A 03/12/2014   Procedure: LEFT HEART CATHETERIZATION WITH CORONARY ANGIOGRAM;  Surgeon: Pixie Casino, MD;  Location: [facility] CATH LAB;  Service: Cardiovascular;  Laterality: N/A;  . LOOP RECORDER IMPLANT N/A 09/15/2014   Procedure: LOOP RECORDER IMPLANT;  Surgeon: Deboraha Sprang, MD;  Location[facility] C[facility] LAB;  Service: Cardiovascular;  Laterality: N/A;  . LOOP RECORDER REMOVAL N/A 09/26/2017   Procedure: LOOP RECORDER REMOVAL;  Surgeon: Constance Haw, MD;  Location: [place] CV [facility];  Service: Cardiovascular;  Laterality: N/A;  . MASS EXCISION Right 09/25/2014   Procedure: RIGHT RING FINGER EXCISION MASS AND DEBRIDEMENT DIP JOINT;  Surgeon: Leanora Cover, MD;  Location: [place];  Service: Orthopedics;  Laterality:  Right;  . TONSILLECTOMY AND ADENOIDECTOMY  1979  . TUBAL LIGATION      There were no vitals filed for this visit.  Subjective Assessment - 07/11/19 1709    Subjective  Pt states she is more sore in her shoulders and left buttock today.  States the dry needling helped on the right side last time. Pt states the pec stretch was hurting in the midback.    Currently in Pain?  Yes   no number given today                      [facility] Adult PT Treatment/Exercise - 07/11/19 0001      Lumbar Exercises: Supine   Other Supine Lumbar Exercises  cervical retractions with gentle scap squeeze - 10x      Manual Therapy   Soft tissue mobilization  thoracic and lumbar paraspinals, low traps, bilateral glutes       Trigger Point Dry Needling - 07/11/19 0001    Consent Given?  Yes    Education Handout Provided  Previously provided    Other Dry Needling  left side only    Gluteus Minimus Response  Twitch response elicited;Palpable increased muscle length    Gluteus Medius Response  Twitch response elicited;Palpable increased muscle length    Piriformis Response  Twitch response elicited;Palpable increased muscle length           PT Education -  07/11/19 1718    Education Details  Access Code: [email or web address]    Person(s) Educated  Patient    Methods  Explanation;Demonstration;Handout;Verbal cues;Tactile cues    Comprehension  Verbalized understanding;Returned demonstration       PT Short Term Goals - 07/04/19 1302      PT SHORT TERM GOAL #1   Title  ind with initial HEP    Status  Achieved        PT Long Term Goals - 07/04/19 1302      PT LONG TERM GOAL #1   Title  Pt will be ind with advanced HEP    Status  On-going      PT LONG TERM GOAL #2   Title  Pt will report 50% reduced pain    Status  On-going            Plan - 07/11/19 1711    Clinical Impression Statement  Pt had good response from dry needling to left gluteals and piriformis with twitch response and  increased soft tissue length.  Pt seems to have very irritable muscle spasms that have not yet subsided in thoracic paraspinals and low traps.  She will continue to benefit from skilled PT to work on improved soft tissue in order to facilitate healing and pain management.    PT Treatment/Interventions  ADLs/Self Care Home Management;Aquatic Therapy;Traction;Moist Heat;Electrical Stimulation;Cryotherapy;Biofeedback;Gait training;Stair training;Therapeutic activities;Therapeutic exercise;Neuromuscular re-education;Patient/family education;Manual techniques;Passive range of motion;Dry needling;Taping    PT Next Visit Plan  f/u on cervical retractions, work on gentle thoraacic movements with gentle glute and core strenthening, continue DN and assess response    PT Home Exercise Plan  Access Code: [email or web address]    Consulted and Agree with Plan of Care  Patient       Patient will benefit from skilled therapeutic intervention in order to improve the following deficits and impairments:  Decreased coordination, Pain, Increased fascial restricitons, Decreased strength, Decreased activity tolerance, Postural dysfunction, Increased muscle spasms, Abnormal gait  Visit Diagnosis: Muscle weakness (generalized)  Chronic bilateral low back pain with bilateral sciatica  Cramp and spasm  Unspecified lack of coordination  Cervicalgia     Problem List Patient Active Problem List   Diagnosis Date Noted  . Paroxysmal atrial fibrillation ([place]) 02/08/2017  . Anxiety 03/30/2016  . Osteopenia 01/27/2016  . Atrial tachycardia ([place]) 11/04/2015  . 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 Palpitations 05/13/2014  . Abnormal nuclear stress test 02/04/2014  . Essential hypertension 01/14/2014  . Chest pressure 01/14/2014  . Edema of foot 01/14/2014    Jule Ser, PT 07/11/2019, 5:21 PM  [facility] [address] [place], [place],  [address] Phone: [phone]   Fax:  [phone]  Name: Kelsey James MRN: [id number] Date of Birth: 04/15/1941

## 2019-07-15 ENCOUNTER — Encounter: Payer: Self-pay | Admitting: Physical Therapy

## 2019-07-15 ENCOUNTER — Other Ambulatory Visit: Payer: Self-pay

## 2019-07-15 ENCOUNTER — Ambulatory Visit: Payer: Medicare Other | Admitting: Physical Therapy

## 2019-07-15 DIAGNOSIS — G8929 Other chronic pain: Secondary | ICD-10-CM

## 2019-07-15 DIAGNOSIS — M6281 Muscle weakness (generalized): Secondary | ICD-10-CM

## 2019-07-15 DIAGNOSIS — R252 Cramp and spasm: Secondary | ICD-10-CM

## 2019-07-15 DIAGNOSIS — M542 Cervicalgia: Secondary | ICD-10-CM

## 2019-07-15 DIAGNOSIS — M5442 Lumbago with sciatica, left side: Secondary | ICD-10-CM | POA: Diagnosis not present

## 2019-07-15 NOTE — Therapy (Signed)
[facility] [address], [place], [place], [address] Phone: [phone]   Fax:  [phone]  Physical Therapy Treatment  Patient Details  Name: Kelsey James MRN: [id number] Date of Birth: 09/30/1941 Referring Provider (PT): Joya Gaskins, [place]   Encounter Date: 07/15/2019  PT End of Session - 07/15/19 1423    Visit Number  9    Date for PT Re-Evaluation  08/12/19    PT Start Time  1417    PT Stop Time  1502    PT Time Calculation (min)  45 min    Activity Tolerance  Patient tolerated treatment well    Behavior During Therapy  [facility] for tasks assessed/performed       Past Medical History:  Diagnosis Date  . A-fib ([place])   . Arthritis    "hands" (11/04/2015)  . Dry eyes   . Glaucoma   . Osteopenia   . Rosacea   . Wears glasses     Past Surgical History:  Procedure Laterality Date  . ATRIAL TACH ABLATION  11/04/2015  . COLONOSCOPY    . ELECTROPHYSIOLOGIC STUDY N/A 11/04/2015   Procedure: A-Tach Ablation;  Surgeon: Will [name], MD;  Location: [place] [facility];  Service: Cardiovascular;  Laterality: N/A;  . LEFT HEART CATHETERIZATION WITH CORONARY ANGIOGRAM N/A 03/12/2014   Procedure: LEFT HEART CATHETERIZATION WITH CORONARY ANGIOGRAM;  Surgeon: Pixie Casino, MD;  Location: [facility] CATH LAB;  Service: Cardiovascular;  Laterality: N/A;  . LOOP RECORDER IMPLANT N/A 09/15/2014   Procedure: LOOP RECORDER IMPLANT;  Surgeon: Deboraha Sprang, MD;  Location: [facility] CATH LAB;  Service: Cardiovascular;  Laterality: N/A;  . LOOP RECORDER REMOVAL N/A 09/26/2017   Procedure: LOOP RECORDER REMOVAL;  Surgeon: Constance Haw, MD;  Location: [place] CV LAB;  Service: Cardiovascular;  Laterality: N/A;  . MASS EXCISION Right 09/25/2014   Procedure: RIGHT RING FINGER EXCISION MASS AND DEBRIDEMENT DIP JOINT;  Surgeon: Leanora Cover, MD;  Location: [place];  Service: Orthopedics;  Laterality:  Right;  . TONSILLECTOMY AND ADENOIDECTOMY  1979  . TUBAL LIGATION      There were no vitals filed for this visit.  Subjective Assessment - 07/15/19 1419    Subjective  Pt feels that DN has helped but today has dull ache across low back, bil buttocks and down posterior legs thighs to ankles. Did vacuum yesterday but not sure if that did it.  Had about 2-3 days of relief before yesterday.    Limitations  Lifting;Standing;Sitting    How long can you sit comfortably?  no time    How long can you stand comfortably?  15-20 min    How long can you walk comfortably?  15-20 min    Patient Stated Goals  Get rid of pain    Currently in Pain?  Yes    Pain Location  Back    Pain Orientation  Lower;Right;Left    Pain Descriptors / Indicators  Dull    Pain Type  Acute pain;Chronic pain    Pain Radiating Towards  bil buttocks and posterior thighs and calves    Pain Onset  More than a month ago    Pain Frequency  Intermittent    Aggravating Factors   vacuuming maybe, sitting, pulling out of dishwasher    Pain Relieving Factors  tylenol  [facility] Adult PT Treatment/Exercise - 07/15/19 0001      Exercises   Exercises  Shoulder      Shoulder Exercises: Seated   Other Seated Exercises  thoracic rotation 3x20 sec with cues to breathe into ribcage, seated bil shoulder flexion for thoraicc extension with inhale/exhale    Other Seated Exercises  scapular retractions and posterior shoulder rolls x 5 each      Manual Therapy   Manual Therapy  Joint mobilization;Soft tissue mobilization    Joint Mobilization  thoracic segmental distraction T4-T8, thoracic ext mobs PAs and facet extension bil, bil rib springing and sideglides Gr II/III     Soft tissue mobilization  bil infraspinatus, teres minor after DN       Trigger Point Dry Needling - 07/15/19 0001    Consent Given?  Yes    Education Handout Provided  Previously provided    Muscles Treated Upper Quadrant   Infraspinatus;Teres minor    Dry Needling Comments  bil    Infraspinatus Response  Twitch response elicited;Palpable increased muscle length    Teres minor Response  Twitch response elicited;Palpable increased muscle length             PT Short Term Goals - 07/04/19 1302      PT SHORT TERM GOAL #1   Title  ind with initial HEP    Status  Achieved        PT Long Term Goals - 07/04/19 1302      PT LONG TERM GOAL #1   Title  Pt will be ind with advanced HEP    Status  On-going      PT LONG TERM GOAL #2   Title  Pt will report 50% reduced pain    Status  On-going            Plan - 07/15/19 1646    Clinical Impression Statement  Pt with report of LBP and bil posterior LE pain on arrival which resolved end of session.  Pt was prone-lying most of visit which she states always seems to help those symptoms.  PT focused on DN to bil posterior shoulders today with multiple trigger points targeted in bil infraspinatus and teres minor.  PT also performed manual therapy to improve thoracic extension and ribcage mobility.  Reviewed and introduced rotation and use of arm movements with targeted breathing for ribcage expansion.  Encouraged Pt to perform small reps of purposeful movement as tolerated every hour during day.  Pt will continue to benefit from skilled PT along current POC with careful progression of ROM and strength/stablilization.    Rehab Potential  Good    PT Frequency  2x / week    PT Duration  8 weeks    PT Treatment/Interventions  ADLs/Self Care Home Management;Aquatic Therapy;Traction;Moist Heat;Electrical Stimulation;Cryotherapy;Biofeedback;Gait training;Stair training;Therapeutic activities;Therapeutic exercise;Neuromuscular re-education;Patient/family education;Manual techniques;Passive range of motion;Dry needling;Taping    PT Next Visit Plan  f/u on DN to posterior shoulders, compliance with purposeful movement each hour of neck, shoulders, trunk, continue DN and  manual as needed    PT Home Exercise Plan  Access Code: [email or web address]    Consulted and Agree with Plan of Care  Patient       Patient will benefit from skilled therapeutic intervention in order to improve the following deficits and impairments:     Visit Diagnosis: Muscle weakness (generalized)  Chronic bilateral low back pain with bilateral sciatica  Cramp and spasm  Cervicalgia     Problem List Patient  Active Problem List   Diagnosis Date Noted  . Paroxysmal atrial fibrillation ([place]) 02/08/2017  . Anxiety 03/30/2016  . Osteopenia 01/27/2016  . Atrial tachycardia ([place]) 11/04/2015  . 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 Palpitations 05/13/2014  . Abnormal nuclear stress test 02/04/2014  . Essential hypertension 01/14/2014  . Chest pressure 01/14/2014  . Edema of foot 01/14/2014   Baruch Merl, PT 07/15/19 4:52 PM   [facility] [address] [place], [place], [address] Phone: [phone]   Fax:  [phone]  Name: LETISHIA GALINATO MRN: [id number] Date of Birth: 1941-07-12

## 2019-07-17 ENCOUNTER — Encounter: Payer: Medicare Other | Admitting: Physical Therapy

## 2019-07-18 ENCOUNTER — Other Ambulatory Visit: Payer: Self-pay

## 2019-07-18 ENCOUNTER — Ambulatory Visit: Payer: Medicare Other | Attending: Family Medicine | Admitting: Physical Therapy

## 2019-07-18 ENCOUNTER — Encounter: Payer: Self-pay | Admitting: Physical Therapy

## 2019-07-18 DIAGNOSIS — M5441 Lumbago with sciatica, right side: Secondary | ICD-10-CM | POA: Insufficient documentation

## 2019-07-18 DIAGNOSIS — G8929 Other chronic pain: Secondary | ICD-10-CM

## 2019-07-18 DIAGNOSIS — R279 Unspecified lack of coordination: Secondary | ICD-10-CM | POA: Diagnosis present

## 2019-07-18 DIAGNOSIS — M6281 Muscle weakness (generalized): Secondary | ICD-10-CM | POA: Diagnosis present

## 2019-07-18 DIAGNOSIS — R252 Cramp and spasm: Secondary | ICD-10-CM | POA: Diagnosis present

## 2019-07-18 DIAGNOSIS — M542 Cervicalgia: Secondary | ICD-10-CM | POA: Diagnosis present

## 2019-07-18 DIAGNOSIS — M5442 Lumbago with sciatica, left side: Secondary | ICD-10-CM | POA: Diagnosis present

## 2019-07-18 NOTE — Therapy (Signed)
[facility] [address] [place], [place], [address] Phone: [phone]   Fax:  [phone]  Physical Therapy Treatment Progress Note Reporting Period 06/17/19 to 07/18/19   See note below for Objective Data and Assessment of Progress/Goals.      Patient Details  Name: Kelsey James MRN: [id number] Date of Birth: [birth year] Referring Provider (PT): Joya Gaskins, [place]   Encounter Date: 07/18/2019  PT End of Session - 07/18/19 1501    Visit Number  10    Date for PT Re-Evaluation  08/12/19    PT Start Time  6073    PT Stop Time  1525    PT Time Calculation (min)  40 min    Activity Tolerance  Patient tolerated treatment well    Behavior During Therapy  [facility] for tasks assessed/performed       Past Medical History:  Diagnosis Date  . A-fib ([place])   . Arthritis    "hands" (11/04/2015)  . Dry eyes   . Glaucoma   . Osteopenia   . Rosacea   . Wears glasses     Past Surgical History:  Procedure Laterality Date  . ATRIAL TACH ABLATION  11/04/2015  . COLONOSCOPY    . ELECTROPHYSIOLOGIC STUDY N/A 11/04/2015   Procedure: A-Tach Ablation;  Surgeon: Will [name], MD;  Location: [place] [facility];  Service: Cardiovascular;  Laterality: N/A;  . LEFT HEART CATHETERIZATION WITH CORONARY ANGIOGRAM N/A 03/12/2014   Procedure: LEFT HEART CATHETERIZATION WITH CORONARY ANGIOGRAM;  Surgeon: Pixie Casino, MD;  Location: [facility] CATH LAB;  Service: Cardiovascular;  Laterality: N/A;  . LOOP RECORDER IMPLANT N/A 09/15/2014   Procedure: LOOP RECORDER IMPLANT;  Surgeon: Deboraha Sprang, MD;  Location: [facility] CATH LAB;  Service: Cardiovascular;  Laterality: N/A;  . LOOP RECORDER REMOVAL N/A 09/26/2017   Procedure: LOOP RECORDER REMOVAL;  Surgeon: Constance Haw, MD;  Location: [place] [facility];  Service: Cardiovascular;  Laterality: N/A;  . MASS EXCISION Right 09/25/2014   Procedure: RIGHT RING FINGER EXCISION MASS  AND DEBRIDEMENT DIP JOINT;  Surgeon: Leanora Cover, MD;  Location: [place];  Service: Orthopedics;  Laterality: Right;  . TONSILLECTOMY AND ADENOIDECTOMY  1979  . TUBAL LIGATION      There were no vitals filed for this visit.  Subjective Assessment - 07/18/19 1450    Subjective  Pt states when her pain is bouncing back and forth between the glutes and upper back.  The pain is still there but not as intense.    Currently in Pain?  Yes    Pain Score  6     Pain Location  Back    Pain Orientation  Lower;Left    Pain Type  Acute pain;Chronic pain                       [facility] Adult PT Treatment/Exercise - 07/18/19 0001      Lumbar Exercises: Aerobic   Nustep  L1 x 3 min; seat and arms 7   PT present for status     Lumbar Exercises: Standing   Other Standing Lumbar Exercises  hip abduction and extension -yellow band above knees - 15x each side      Shoulder Exercises: Standing   Flexion  Strengthening;Both;10 reps    Theraband Level (Shoulder Flexion)  Level 1 (Yellow)    Flexion Limitations  isometric with TrA engaged; arms at the sides, stepping fwd and  back      Manual Therapy   Soft tissue mobilization  lumbar paraspinals bilateral       Trigger Point Dry Needling - 07/18/19 0001    Consent Given?  Yes    Education Handout Provided  Previously provided    Lumbar multifidi Response  Twitch response elicited;Palpable increased muscle length   bilateral            PT Short Term Goals - 07/04/19 1302      PT SHORT TERM GOAL #1   Title  ind with initial HEP    Status  Achieved        PT Long Term Goals - 07/18/19 1503      PT LONG TERM GOAL #1   Title  Pt will be ind with advanced HEP    Status  On-going      PT LONG TERM GOAL #2   Title  Pt will report 50% reduced pain    Baseline  40% improved - I couldn't dust or make the bed which I am doing now    Status  On-going      PT LONG TERM GOAL #3   Title  Pt will be able to  demonstrate forward flexion of lumbar spine with hands reaching to knees in order to pick up items off the floor more easily    Baseline  forward flexion to the knees    Status  Achieved      PT LONG TERM GOAL #4   Title  FOTO </= to 49% limited    Baseline  48% limited    Status  Achieved            Plan - 07/18/19 1532    Clinical Impression Statement  Pt has made a lot of functional progress in PT.  She has met FOTO goal with 48% limited function down from 77% limitation. Pt is able to do more around the house.  She continue to have pain that limits several of her normal activities such as sweeping and vacuuming, as well as recrational activities like playing cards.  pt has greater lumbar mobility.  She demonstrates core an hip weakness and continues to hae some muscle spasms throughout lumbar spine.  Pt tolerated more exercises today and she will continue to benefit from skilled PT to address remaining goals.    PT Treatment/Interventions  ADLs/Self Care Home Management;Aquatic Therapy;Traction;Moist Heat;Electrical Stimulation;Cryotherapy;Biofeedback;Gait training;Stair training;Therapeutic activities;Therapeutic exercise;Neuromuscular re-education;Patient/family education;Manual techniques;Passive range of motion;Dry needling;Taping       Patient will benefit from skilled therapeutic intervention in order to improve the following deficits and impairments:  Decreased coordination, Pain, Increased fascial restricitons, Decreased strength, Decreased activity tolerance, Postural dysfunction, Increased muscle spasms, Abnormal gait  Visit Diagnosis: Muscle weakness (generalized)  Chronic bilateral low back pain with bilateral sciatica  Cramp and spasm  Cervicalgia  Unspecified lack of coordination     Problem List Patient Active Problem List   Diagnosis Date Noted  . Paroxysmal atrial fibrillation ([place]) 02/08/2017  . Anxiety 03/30/2016  . Osteopenia 01/27/2016  . Atrial  tachycardia ([place]) 11/04/2015  . 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 Palpitations 05/13/2014  . Abnormal nuclear stress test 02/04/2014  . Essential hypertension 01/14/2014  . Chest pressure 01/14/2014  . Edema of foot 01/14/2014    Jule Ser, PT 07/18/2019, 3:59 PM  [facility] [address], [place], [place], [address] Phone: [phone]   Fax:  [phone]  Name: ONEY TATLOCK MRN: [id number] Date of Birth: 12-09-1940

## 2019-07-23 ENCOUNTER — Ambulatory Visit: Payer: Medicare Other | Admitting: Physical Therapy

## 2019-07-23 ENCOUNTER — Other Ambulatory Visit: Payer: Self-pay

## 2019-07-23 ENCOUNTER — Encounter: Payer: Self-pay | Admitting: Physical Therapy

## 2019-07-23 DIAGNOSIS — M6281 Muscle weakness (generalized): Secondary | ICD-10-CM | POA: Diagnosis not present

## 2019-07-23 DIAGNOSIS — R252 Cramp and spasm: Secondary | ICD-10-CM

## 2019-07-23 DIAGNOSIS — R279 Unspecified lack of coordination: Secondary | ICD-10-CM

## 2019-07-23 DIAGNOSIS — M542 Cervicalgia: Secondary | ICD-10-CM

## 2019-07-23 DIAGNOSIS — G8929 Other chronic pain: Secondary | ICD-10-CM

## 2019-07-23 NOTE — Therapy (Signed)
[facility] [address] [place], [place], [address] Phone: [phone]   Fax:  [phone]  Physical Therapy Treatment  Patient Details  Name: Kelsey James MRN: [id number] Date of Birth: 1941/06/22 Referring Provider (PT): Joya Gaskins, [place]   Encounter Date: 07/23/2019  PT End of Session - 07/23/19 1453    Visit Number  11    Date for PT Re-Evaluation  08/12/19    PT Start Time  1446    PT Stop Time  1530    PT Time Calculation (min)  44 min    Activity Tolerance  Patient tolerated treatment well    Behavior During Therapy  [facility] for tasks assessed/performed       Past Medical History:  Diagnosis Date  . A-fib ([place])   . Arthritis    "hands" (11/04/2015)  . Dry eyes   . Glaucoma   . Osteopenia   . Rosacea   . Wears glasses     Past Surgical History:  Procedure Laterality Date  . ATRIAL TACH ABLATION  11/04/2015  . COLONOSCOPY    . ELECTROPHYSIOLOGIC STUDY N/A 11/04/2015   Procedure: A-Tach Ablation;  Surgeon: Will [name], MD;  Location: [place] [facility];  Service: Cardiovascular;  Laterality: N/A;  . LEFT HEART CATHETERIZATION WITH CORONARY ANGIOGRAM N/A 03/12/2014   Procedure: LEFT HEART CATHETERIZATION WITH CORONARY ANGIOGRAM;  Surgeon: Pixie Casino, MD;  Location: [facility] CATH LAB;  Service: Cardiovascular;  Laterality: N/A;  . LOOP RECORDER IMPLANT N/A 09/15/2014   Procedure: LOOP RECORDER IMPLANT;  Surgeon: Deboraha Sprang, MD;  Location: [facility] CATH LAB;  Service: Cardiovascular;  Laterality: N/A;  . LOOP RECORDER REMOVAL N/A 09/26/2017   Procedure: LOOP RECORDER REMOVAL;  Surgeon: Constance Haw, MD;  Location: [place] [facility];  Service: Cardiovascular;  Laterality: N/A;  . MASS EXCISION Right 09/25/2014   Procedure: RIGHT RING FINGER EXCISION MASS AND DEBRIDEMENT DIP JOINT;  Surgeon: Leanora Cover, MD;  Location: [place];  Service: Orthopedics;  Laterality:  Right;  . TONSILLECTOMY AND ADENOIDECTOMY  1979  . TUBAL LIGATION      There were no vitals filed for this visit.  Subjective Assessment - 07/23/19 1449    Subjective  Pt states she still has ongoing bil hip pain that seems to switch sides.  Coccyx pain seems to be gone.  Back of shoulder blades still hurt too.   "I overdid it the last few days working in the yard and hosted a dinner."  I am frustrated by my pain that moves around.  Right hip is really bad today.    Limitations  Lifting;Standing;Sitting    How long can you sit comfortably?  no time    How long can you stand comfortably?  15-20 min    How long can you walk comfortably?  15-20 min    Patient Stated Goals  Get rid of pain    Currently in Pain?  Yes    Pain Score  9     Pain Location  Hip    Pain Orientation  Right;Lateral    Pain Descriptors / Indicators  Spasm    Pain Type  Acute pain;Chronic pain    Pain Onset  More than a month ago    Pain Frequency  Intermittent                       [facility] Adult PT Treatment/Exercise - 07/23/19 0001  Neuro Re-ed    Neuro Re-ed Details   TrA in supine, use of co-contraction of pelvic floor to enhance awareness and recruitment      Exercises   Exercises  Shoulder;Lumbar;Knee/Hip      Lumbar Exercises: Stretches   Lower Trunk Rotation  3 reps;10 seconds    Piriformis Stretch  Left;Right;1 rep;30 seconds      Lumbar Exercises: Supine   Ab Set  5 reps    AB Set Limitations  use of pelvic floor to enhance TrA    Pelvic Tilt  10 reps    Clam  10 reps    Clam Limitations  yellow band    Bridge  10 reps    Bridge Limitations  articulating with TrA       Trigger Point Dry Needling - 07/23/19 0001    Consent Given?  Yes    Education Handout Provided  Previously provided    Muscles Treated Back/Hip  Gluteus maximus;Gluteus medius;Gluteus minimus;Piriformis    Other Dry Needling  right only    Gluteus Minimus Response  Twitch response elicited;Palpable  increased muscle length    Gluteus Medius Response  Twitch response elicited;Palpable increased muscle length    Gluteus Maximus Response  Twitch response elicited;Palpable increased muscle length    Piriformis Response  Twitch response elicited;Palpable increased muscle length           PT Education - 07/23/19 1527    Education Details  Access Code: [email or web address]    Person(s) Educated  Patient    Methods  Explanation;Demonstration;Verbal cues;Handout    Comprehension  Verbalized understanding;Returned demonstration       PT Short Term Goals - 07/04/19 1302      PT SHORT TERM GOAL #1   Title  ind with initial HEP    Status  Achieved        PT Long Term Goals - 07/18/19 1503      PT LONG TERM GOAL #1   Title  Pt will be ind with advanced HEP    Status  On-going      PT LONG TERM GOAL #2   Title  Pt will report 50% reduced pain    Baseline  40% improved - I couldn't dust or make the bed which I am doing now    Status  On-going      PT LONG TERM GOAL #3   Title  Pt will be able to demonstrate forward flexion of lumbar spine with hands reaching to knees in order to pick up items off the floor more easily    Baseline  forward flexion to the knees    Status  Achieved      PT LONG TERM GOAL #4   Title  FOTO </= to 49% limited    Baseline  48% limited    Status  Achieved            Plan - 07/23/19 1533    Clinical Impression Statement  Pt discouraged by pain experience.  PT discussed that we need to continue working toward strength and stability for muscles to be stronger for daily activity tolerance and demands.  She was agreeable to this and PT updated HEP to include core and hip and lumbar strength and gentle ROM in supine.  PT also dry needled Rt hip which was a 9/10 after increaesd activity yesterday.  Improved mobility and pain end of session.  PT needed to co-cue pelvic floor to find TrA today.  Tolerated new  exercises wihtout increased pain.  Continue along POC  with focused transition of visits towards stabilization of lumbar and scapulae/neck, A/ROM, stretches as tol.    Rehab Potential  Good    PT Frequency  2x / week    PT Duration  8 weeks    PT Treatment/Interventions  ADLs/Self Care Home Management;Aquatic Therapy;Traction;Moist Heat;Electrical Stimulation;Cryotherapy;Biofeedback;Gait training;Stair training;Therapeutic activities;Therapeutic exercise;Neuromuscular re-education;Patient/family education;Manual techniques;Passive range of motion;Dry needling;Taping    PT Next Visit Plan  f/u on Rt hip pain/DN, continue gentle mobility/ROM for lumbar and upper quadrant, move focus of visits toward stabilization/strength with manual/DN as needed    PT Home Exercise Plan  Access Code: [email or web address]    Consulted and Agree with Plan of Care  Patient       Patient will benefit from skilled therapeutic intervention in order to improve the following deficits and impairments:     Visit Diagnosis: Muscle weakness (generalized)  Chronic bilateral low back pain with bilateral sciatica  Cramp and spasm  Cervicalgia  Unspecified lack of coordination     Problem List Patient Active Problem List   Diagnosis Date Noted  . Paroxysmal atrial fibrillation ([place]) 02/08/2017  . Anxiety 03/30/2016  . Osteopenia 01/27/2016  . Atrial tachycardia ([place]) 11/04/2015  . 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 Palpitations 05/13/2014  . Abnormal nuclear stress test 02/04/2014  . Essential hypertension 01/14/2014  . Chest pressure 01/14/2014  . Edema of foot 01/14/2014    Baruch Merl, PT 07/23/19 5:24 PM   [facility] [address], [place], [place], [address] Phone: [phone]   Fax:  [phone]  Name: Kelsey James MRN: [id number] Date of Birth: 08-05-1941

## 2019-07-23 NOTE — Patient Instructions (Signed)
Access Code: [email or web address]  URL: [email or web address]  Date: 07/23/2019  Prepared by: Venetia Night Shelagh Rayman   Exercises  Supine Diaphragmatic Breathing with Pelvic Floor Lengthening - 10 reps - 1 sets - 3x daily - 7x weekly  Seated Thoracic Lumbar Extension with Pectoralis Stretch - 10 reps - 3 sets - 1x daily - 7x weekly  Sidelying Thoracic and Shoulder Rotation - 10 reps - 1 sets - 5 sec hold - 1x daily - 7x weekly  Supine Chin Tuck - 10 reps - 3 sets - 1x daily - 7x weekly  Supine Lower Trunk Rotation - 3 reps - 1 sets - 10 hold - 1x daily - 7x weekly  Supine Posterior Pelvic Tilt - 10 reps - 1 sets - 2 hold - 1x daily - 7x weekly  Bridge - 10 reps - 1 sets - 1x daily - 7x weekly  Hooklying Clamshell with Resistance - 10 reps - 1 sets - 1x daily - 7x weekly

## 2019-07-25 ENCOUNTER — Ambulatory Visit: Payer: Medicare Other | Admitting: Physical Therapy

## 2019-07-25 ENCOUNTER — Encounter: Payer: Self-pay | Admitting: Physical Therapy

## 2019-07-25 ENCOUNTER — Other Ambulatory Visit: Payer: Self-pay

## 2019-07-25 DIAGNOSIS — R252 Cramp and spasm: Secondary | ICD-10-CM

## 2019-07-25 DIAGNOSIS — M542 Cervicalgia: Secondary | ICD-10-CM

## 2019-07-25 DIAGNOSIS — R279 Unspecified lack of coordination: Secondary | ICD-10-CM

## 2019-07-25 DIAGNOSIS — M6281 Muscle weakness (generalized): Secondary | ICD-10-CM

## 2019-07-25 DIAGNOSIS — M5442 Lumbago with sciatica, left side: Secondary | ICD-10-CM

## 2019-07-25 DIAGNOSIS — G8929 Other chronic pain: Secondary | ICD-10-CM

## 2019-07-25 NOTE — Therapy (Signed)
[facility] [address] [place], [place], [address] Phone: [phone]   Fax:  [phone]  Physical Therapy Treatment  Patient Details  Name: Kelsey James MRN: [id number] Date of Birth: [birth year] Referring Provider (PT): Joya Gaskins, [place]   Encounter Date: 07/25/2019  PT End of Session - 07/25/19 1156    Visit Number  12    Date for PT Re-Evaluation  08/12/19    PT Start Time  1145    PT Stop Time  1225    PT Time Calculation (min)  40 min    Activity Tolerance  Patient tolerated treatment well    Behavior During Therapy  [facility] for tasks assessed/performed       Past Medical History:  Diagnosis Date  . A-fib ([place])   . Arthritis    "hands" (11/04/2015)  . Dry eyes   . Glaucoma   . Osteopenia   . Rosacea   . Wears glasses     Past Surgical History:  Procedure Laterality Date  . ATRIAL TACH ABLATION  11/04/2015  . COLONOSCOPY    . ELECTROPHYSIOLOGIC STUDY N/A 11/04/2015   Procedure: A-Tach Ablation;  Surgeon: Will [name], MD;  Location: [place] [facility];  Service: Cardiovascular;  Laterality: N/A;  . LEFT HEART CATHETERIZATION WITH CORONARY ANGIOGRAM N/A 03/12/2014   Procedure: LEFT HEART CATHETERIZATION WITH CORONARY ANGIOGRAM;  Surgeon: Pixie Casino, MD;  Location: [facility] CATH LAB;  Service: Cardiovascular;  Laterality: N/A;  . LOOP RECORDER IMPLANT N/A 09/15/2014   Procedure: LOOP RECORDER IMPLANT;  Surgeon: Deboraha Sprang, MD;  Location: [facility] CATH LAB;  Service: Cardiovascular;  Laterality: N/A;  . LOOP RECORDER REMOVAL N/A 09/26/2017   Procedure: LOOP RECORDER REMOVAL;  Surgeon: Constance Haw, MD;  Location: [place] [facility];  Service: Cardiovascular;  Laterality: N/A;  . MASS EXCISION Right 09/25/2014   Procedure: RIGHT RING FINGER EXCISION MASS AND DEBRIDEMENT DIP JOINT;  Surgeon: Leanora Cover, MD;  Location: [place];  Service: Orthopedics;  Laterality:  Right;  . TONSILLECTOMY AND ADENOIDECTOMY  1979  . TUBAL LIGATION      There were no vitals filed for this visit.  Subjective Assessment - 07/25/19 1152    Subjective  My coccyx is not bothering me. My pain is traveling. I have pain in the right hip. Patient has pain in the back of her shoulders. I cleaned my house yesterday. I have been doing my exercises.    Limitations  Lifting;Standing;Sitting    How long can you sit comfortably?  no time    How long can you stand comfortably?  15-20 min    How long can you walk comfortably?  15-20 min    Patient Stated Goals  Get rid of pain    Currently in Pain?  Yes    Pain Score  9     Pain Location  Hip    Pain Orientation  Right;Lateral    Pain Descriptors / Indicators  Spasm    Pain Type  Chronic pain;Acute pain    Pain Onset  More than a month ago    Pain Frequency  Intermittent    Aggravating Factors   vacuuming, sitting, pulling out of dishwasher    Pain Relieving Factors  hold back when walking    Multiple Pain Sites  No         [facility] PT Assessment - 07/25/19 0001      Assessment   Medical  Diagnosis  M54.5 (ICD-10-CM) - Low back pain    Referring Provider (PT)  Joya Gaskins, FNP      Precautions   Precautions  None      Restrictions   Weight Bearing Restrictions  No                   [facility] Adult PT Treatment/Exercise - 07/25/19 0001      Lumbar Exercises: Stretches   Active Hamstring Stretch  Right;Left;1 rep;30 seconds    Lower Trunk Rotation  3 reps;10 seconds    Piriformis Stretch  Left;Right;1 rep;30 seconds   supine     Lumbar Exercises: Supine   Glut Set  10 reps;5 seconds    Dead Bug  10 reps;1 second    Bridge  10 reps    Bridge Limitations  articulating with TrA      Knee/Hip Exercises: Sidelying   Clams  2 sets of 10 bil.       Manual Therapy   Manual Therapy  Soft tissue mobilization    Soft tissue mobilization  using addaday to right gluteal, piriformis, and lumbar  paraspinals in left sidely               PT Short Term Goals - 07/04/19 1302      PT SHORT TERM GOAL #1   Title  ind with initial HEP    Status  Achieved        PT Long Term Goals - 07/25/19 1217      PT LONG TERM GOAL #1   Title  Pt will be ind with advanced HEP    Baseline  still learning    Time  8    Period  Weeks    Status  On-going      PT LONG TERM GOAL #2   Title  Pt will report 50% reduced pain    Baseline  40% improved - I couldn't dust or make the bed which I am doing now    Time  8    Period  Weeks    Status  On-going      PT LONG TERM GOAL #3   Title  Pt will be able to demonstrate forward flexion of lumbar spine with hands reaching to knees in order to pick up items off the floor more easily    Time  8    Period  Weeks    Status  Achieved            Plan - 07/25/19 1225    Clinical Impression Statement  Patient was complaining of right hip pain. Patient was able to do her exercises and added more that are low level. Patient was able to fully lift her hips with bridges. Patient needed tactile cues to not move hips when performins the clam in sidely. Patient will benefit from skilled therapy to work on stabilization of lumbar and scapulae/neck, A/AROM, stretches as tolerated.    Personal Factors and Comorbidities  Age    Examination-Activity Limitations  Bathing;Bed Mobility;Bend;Caring for Others;Lift;Sit;Sleep;Stairs;Stand    Examination-Participation Restrictions  Cleaning    Stability/Clinical Decision Making  Evolving/Moderate complexity    Rehab Potential  Good    PT Frequency  2x / week    PT Duration  8 weeks    PT Treatment/Interventions  ADLs/Self Care Home Management;Aquatic Therapy;Traction;Moist Heat;Electrical Stimulation;Cryotherapy;Biofeedback;Gait training;Stair training;Therapeutic activities;Therapeutic exercise;Neuromuscular re-education;Patient/family education;Manual techniques;Passive range of motion;Dry needling;Taping     PT Next Visit Plan  continue  gentle mobility/ROM for lumbar and upper quadrant, move focus of visits toward stabilization/strength with manual/DN as needed    PT Home Exercise Plan  Access Code: [email or web address]    Consulted and Agree with Plan of Care  Patient       Patient will benefit from skilled therapeutic intervention in order to improve the following deficits and impairments:  Decreased coordination, Pain, Increased fascial restricitons, Decreased strength, Decreased activity tolerance, Postural dysfunction, Increased muscle spasms, Abnormal gait  Visit Diagnosis: Muscle weakness (generalized)  Chronic bilateral low back pain with bilateral sciatica  Cramp and spasm  Cervicalgia  Unspecified lack of coordination     Problem List Patient Active Problem List   Diagnosis Date Noted  . Paroxysmal atrial fibrillation ([place]) 02/08/2017  . Anxiety 03/30/2016  . Osteopenia 01/27/2016  . Atrial tachycardia ([place]) 11/04/2015  . 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 Palpitations 05/13/2014  . Abnormal nuclear stress test 02/04/2014  . Essential hypertension 01/14/2014  . Chest pressure 01/14/2014  . Edema of foot 01/14/2014    Earlie Counts, PT 07/25/19 12:31 PM   [facility] [address] [place], [place], [address] Phone: [phone]   Fax:  [phone]  Name: ADRIENA PHENIS MRN: [id number] Date of Birth: 1940-12-24

## 2019-07-29 ENCOUNTER — Ambulatory Visit: Payer: Medicare Other | Admitting: Physical Therapy

## 2019-07-29 ENCOUNTER — Encounter: Payer: Self-pay | Admitting: Physical Therapy

## 2019-07-29 ENCOUNTER — Other Ambulatory Visit: Payer: Self-pay

## 2019-07-29 DIAGNOSIS — G8929 Other chronic pain: Secondary | ICD-10-CM

## 2019-07-29 DIAGNOSIS — M542 Cervicalgia: Secondary | ICD-10-CM

## 2019-07-29 DIAGNOSIS — R252 Cramp and spasm: Secondary | ICD-10-CM

## 2019-07-29 DIAGNOSIS — R279 Unspecified lack of coordination: Secondary | ICD-10-CM

## 2019-07-29 DIAGNOSIS — M6281 Muscle weakness (generalized): Secondary | ICD-10-CM

## 2019-07-29 NOTE — Therapy (Addendum)
[facility] [address] [place], [place], [address] Phone: [phone]   Fax:  [phone]  Physical Therapy Treatment  Patient Details  Name: Kelsey James MRN: [id number] Date of Birth: Mar 20, 1941 Referring Provider (PT): Joya Gaskins, [place]   Encounter Date: 07/29/2019  PT End of Session - 07/29/19 1433    Visit Number  13    Date for PT Re-Evaluation  08/12/19    PT Start Time  4709    PT Stop Time  1455    PT Time Calculation (min)  40 min    Activity Tolerance  Patient tolerated treatment well    Behavior During Therapy  [facility] for tasks assessed/performed       Past Medical History:  Diagnosis Date  . A-fib ([place])   . Arthritis    "hands" (11/04/2015)  . Dry eyes   . Glaucoma   . Osteopenia   . Rosacea   . Wears glasses     Past Surgical History:  Procedure Laterality Date  . ATRIAL TACH ABLATION  11/04/2015  . COLONOSCOPY    . ELECTROPHYSIOLOGIC STUDY N/A 11/04/2015   Procedure: A-Tach Ablation;  Surgeon: Will [name], MD;  Location: [facility];  Service: Cardiovascular;  Laterality: N/A;  . LEFT HEART CATHETERIZATION WITH CORONARY ANGIOGRAM N/A 03/12/2014   Procedure: LEFT HEART CATHETERIZATION WITH CORONARY ANGIOGRAM;  Surgeon: Pixie Casino, MD;  Location: [facility] CATH [facility];  Service: Cardiovascular;  Laterality: N/A;  . LOOP RECORDER IMPLANT N/A 09/15/2014   Procedure: LOOP RECORDER IMPLANT;  Surgeon: Deboraha Sprang, MD;  Location: [facility] CATH LAB;  Service: Cardiovascular;  Laterality: N/A;  . LOOP RECORDER REMOVAL N/A 09/26/2017   Procedure: LOOP RECORDER REMOVAL;  Surgeon: Constance Haw, MD;  Location: [place];  Service: Cardiovascular;  Laterality: N/A;  . MASS EXCISION Right 09/25/2014   Procedure: RIGHT RING FINGER EXCISION MASS AND DEBRIDEMENT DIP JOINT;  Surgeon: Leanora Cover, MD;  Location: [facility];  Service: Orthopedics;  Laterality:  Right;  . TONSILLECTOMY AND ADENOIDECTOMY  1979  . TUBAL LIGATION      There were no vitals filed for this visit.  Subjective Assessment - 07/29/19 1429    Subjective  I have done okay since last visit. I do everything I want to. Pain is all over. I do not have constant coccyx pain.    Limitations  Lifting;Standing;Sitting    How long can you sit comfortably?  no time    How long can you stand comfortably?  15-20 min    How long can you walk comfortably?  15-20 min    Patient Stated Goals  Get rid of pain    Currently in Pain?  Yes    Pain Score  8     Pain Location  Hip    Pain Orientation  Right;Lateral    Pain Descriptors / Indicators  Spasm    Pain Type  Acute pain;Chronic pain    Pain Onset  More than a month ago    Pain Frequency  Intermittent    Aggravating Factors   vacuuming, sitting, pulling out of dishwasher    Pain Relieving Factors  hold back when walking         [facility] PT Assessment - 07/29/19 0001      Strength   Overall Strength Comments  4/5 throughout LE with pain  [facility] PT Treatment/Exercise - 07/29/19 0001      Lumbar Exercises: Aerobic   Nustep  5 min level 1, seat #8, arm #10      Lumbar Exercises: Supine   Isometric Hip Flexion  10 reps;5 seconds      Knee/Hip Exercises: Standing   Hip Abduction  Stengthening;Right;Left;2 sets;10 reps;Knee straight    Abduction Limitations  2# weight each hip    Hip Extension  Stengthening;Right;Left;2 sets;10 reps;Knee straight    Extension Limitations  2#      Knee/Hip Exercises: Sidelying   Clams  2 sets of 10 bil.       Shoulder Exercises: Standing   Flexion  Strengthening;Right;Left;10 reps    Flexion Limitations  alternating and standing on foam    Extension  Strengthening;Both;10 reps;Theraband   2 sets of 10   Theraband Level (Shoulder Extension)  Level 2 (Red)    Extension Limitations  standing on foam               PT Short Term Goals - 07/04/19  1302      PT SHORT TERM GOAL #1   Title  ind with initial HEP    Status  Achieved        PT Long Term Goals - 07/29/19 1459      PT LONG TERM GOAL #1   Title  Pt will be ind with advanced HEP    Baseline  still learning    Time  8    Period  Weeks    Status  On-going      PT LONG TERM GOAL #2   Title  Pt will report 50% reduced pain    Baseline  40% improved - I couldn't dust or make the bed which I am doing now; pain is more with standing and moving around    Time  8    Period  Weeks    Status  On-going      PT LONG TERM GOAL #3   Title  Pt will be able to demonstrate forward flexion of lumbar spine with hands reaching to knees in order to pick up items off the floor more easily    Baseline  forward flexion to the knees    Time  8    Period  Weeks    Status  Achieved      PT LONG TERM GOAL #4   Title  FOTO </= to 49% limited    Baseline  48% limited    Time  8    Period  Weeks    Status  Achieved            Plan - 07/29/19 1432    Clinical Impression Statement  Patient was able to exercise without extra pain. Patient is able to do the exercises with good control. Patient is able to do more reps with her exercises. Patient is doing standing exercises to strengthen her hips and balance. Patient has more pain with standing and performing daily tasks. Patient hip strength is 4/5. Patient will benefit from skilled therapy to work on stabilization of lumbar and scapula/neck A/AROM, stretches as tolerated.    Personal Factors and Comorbidities  Age    Examination-Activity Limitations  Bathing;Bed Mobility;Bend;Caring for Others;Lift;Sit;Sleep;Stairs;Stand    Examination-Participation Restrictions  Cleaning    Stability/Clinical Decision Making  Evolving/Moderate complexity    Rehab Potential  Good    PT Frequency  2x / week    PT Duration  8  weeks    PT Treatment/Interventions  ADLs/Self Care Home Management;Aquatic Therapy;Traction;Moist Heat;Electrical  Stimulation;Cryotherapy;Biofeedback;Gait training;Stair training;Therapeutic activities;Therapeutic exercise;Neuromuscular re-education;Patient/family education;Manual techniques;Passive range of motion;Dry needling;Taping    PT Next Visit Plan  continue gentle mobility/ROM for lumbar and upper quadrant, move focus of visits toward stabilization/strength with manual/DN as needed    PT Home Exercise Plan  Access Code: [email or web address]    Recommended Other Services  MD did not sign the initial note yet    Consulted and Agree with Plan of Care  Patient       Patient will benefit from skilled therapeutic intervention in order to improve the following deficits and impairments:  Decreased coordination, Pain, Increased fascial restricitons, Decreased strength, Decreased activity tolerance, Postural dysfunction, Increased muscle spasms, Abnormal gait  Visit Diagnosis: Muscle weakness (generalized)  Chronic bilateral low back pain with bilateral sciatica  Cramp and spasm  Cervicalgia  Unspecified lack of coordination     Problem List Patient Active Problem List   Diagnosis Date Noted  . Paroxysmal atrial fibrillation ([place]) 02/08/2017  . Anxiety 03/30/2016  . Osteopenia 01/27/2016  . Atrial tachycardia ([place]) 11/04/2015  . 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 Palpitations 05/13/2014  . Abnormal nuclear stress test 02/04/2014  . Essential hypertension 01/14/2014  . Chest pressure 01/14/2014  . Edema of foot 01/14/2014    Earlie Counts, PT 07/29/19 3:00 PM   [facility] [address], [place], [place], [address] Phone: [phone]   Fax:  [phone]  Name: TOREY REGAN MRN: [id number] Date of Birth: 25-Feb-1941  PHYSICAL THERAPY DISCHARGE SUMMARY  Visits from Start of Care: 13  Current functional level related to goals / functional outcomes: See above. Patient requested to be discharged due to not feeling  better.    Remaining deficits: See above.    Education / Equipment: HEP Plan: Patient agrees to discharge.  Patient goals were not met. Patient is being discharged due to the patient's request. Thank you for the referral. Earlie Counts, PT 08/01/19 4:44 PM   ?????

## 2019-07-31 ENCOUNTER — Ambulatory Visit: Payer: Medicare Other | Admitting: Physical Therapy

## 2019-08-05 ENCOUNTER — Other Ambulatory Visit: Payer: Self-pay

## 2019-08-05 ENCOUNTER — Encounter: Payer: Medicare Other | Admitting: Physical Therapy

## 2019-08-06 ENCOUNTER — Encounter: Payer: Self-pay | Admitting: Women's Health

## 2019-08-06 ENCOUNTER — Ambulatory Visit (INDEPENDENT_AMBULATORY_CARE_PROVIDER_SITE_OTHER): Payer: Medicare Other | Admitting: Women's Health

## 2019-08-06 DIAGNOSIS — Z01419 Encounter for gynecological examination (general) (routine) without abnormal findings: Secondary | ICD-10-CM

## 2019-08-06 DIAGNOSIS — G4709 Other insomnia: Secondary | ICD-10-CM

## 2019-08-06 MED ORDER — ALPRAZOLAM 0.25 MG PO TABS: ORAL_TABLET | ORAL | 1 refills | Status: AC

## 2019-08-06 NOTE — Progress Notes (Signed)
Kelsey James 10/13/1941 [id number]    History:    Presents for breast and pelvic exam.  Normal Pap and mammogram history.  Has had Pneumovax but has not had Shingrix.  02/17/18 T score -1.7 stable.  02-17-17- breast biopsy normal mammograms after.  Primary care manages hypertension, A. fib.  States has stopped taking the Eliquis was causing a problem with her lower leg healing plus it is expensive.  History of glaucoma.  05/2019 was seen in the ER for questionable collapse that she relates to pain, biggest problem now is back, shoulder, hip pain, currently in physical therapy and has follow-up scheduled.  Had a fall 6 months ago that caused a hematoma on her lower leg discoloration is now resolving.  uses an occasional Xanax.  Past medical history, past surgical history, family history and social history were all reviewed and documented in the EPIC chart.  Had 2 daughters 1 deceased from an [place] in 02/17/01.  Avid bridge and duplicate player unable to play for the last 6 months and missing it.  Husband poor health/not sexually active.  ROS:  A ROS was performed and pertinent positives and negatives are included.  Exam:  Vitals:   08/06/19 1502  BP: 122/74  Weight: 121 lb (54.9 kg)  Height: 5\' 4"  (1.626 m)   Body mass index is 20.77 kg/m.   General appearance:  Normal Thyroid:  Symmetrical, normal in size, without palpable masses or nodularity. Respiratory  Auscultation:  Clear without wheezing or rhonchi Cardiovascular  Auscultation:  Regular rate, without rubs, murmurs or gallops  Edema/varicosities:  Not grossly evident Abdominal  Soft,nontender, without masses, guarding or rebound.  Liver/spleen:  No organomegaly noted  Hernia:  None appreciated  Skin  Inspection:  Grossly normal   Breasts: Examined lying and sitting.     Right: Without masses, retractions, discharge or axillary adenopathy.     Left: Without masses, retractions, discharge or axillary adenopathy. Gentitourinary    Inguinal/mons:  Normal without inguinal adenopathy  External genitalia:  Normal  BUS/Urethra/Skene's glands:  Normal  Vagina:  Normal  Cervix:  Normal  Uterus:  normal in size, shape and contour.  Midline and mobile  Adnexa/parametria:     Rt: Without masses or tenderness.   Lt: Without masses or tenderness.  Anus and perineum: Normal  Digital rectal exam: Normal sphincter tone without palpated masses or tenderness  Assessment/Plan:  78 y.o. MWF G2P21 deceased for breast and pelvic exam with back, shoulder and hip pain no GYN complaints.  Postmenopausal no HRT with no bleeding A. fib-Eliquis cardiologist managing Hypertension primary care manages labs and meds Osteopenia without elevated FRAX  Plan: Strongly encouraged follow-up with cardiologist for medication, reviewed best not to quit Eliquis due to A. fib, states will schedule appointment.  SBEs, annual screening mammogram, calcium rich foods, weightbearing and balance type exercise encouraged.  Home safety, fall prevention discussed.  Keep scheduled follow-up with orthopedist for back pain.    Huel Cote [facility], 3:20 PM 08/06/2019

## 2019-08-06 NOTE — Patient Instructions (Addendum)
shingrex vaccines, shingles  Insomnia Insomnia is a sleep disorder that makes it difficult to fall asleep or stay asleep. Insomnia can cause fatigue, low energy, difficulty concentrating, mood swings, and poor performance at work or school. There are three different ways to classify insomnia:  Difficulty falling asleep.  Difficulty staying asleep.  Waking up too early in the morning. Any type of insomnia can be long-term (chronic) or short-term (acute). Both are common. Short-term insomnia usually lasts for three months or less. Chronic insomnia occurs at least three times a week for longer than three months. What are the causes? Insomnia may be caused by another condition, situation, or substance, such as:  Anxiety.  Certain medicines.  Gastroesophageal reflux disease (GERD) or other gastrointestinal conditions.  Asthma or other breathing conditions.  Restless legs syndrome, sleep apnea, or other sleep disorders.  Chronic pain.  Menopause.  Stroke.  Abuse of alcohol, tobacco, or illegal drugs.  Mental health conditions, such as depression.  Caffeine.  Neurological disorders, such as Alzheimer's disease.  An overactive thyroid (hyperthyroidism). Sometimes, the cause of insomnia may not be known. What increases the risk? Risk factors for insomnia include:  Gender. Women are affected more often than men.  Age. Insomnia is more common as you get older.  Stress.  Lack of exercise.  Irregular work schedule or working night shifts.  Traveling between different time zones.  Certain medical and mental health conditions. What are the signs or symptoms? If you have insomnia, the main symptom is having trouble falling asleep or having trouble staying asleep. This may lead to other symptoms, such as:  Feeling fatigued or having low energy.  Feeling nervous about going to sleep.  Not feeling rested in the morning.  Having trouble concentrating.  Feeling  irritable, anxious, or depressed. How is this diagnosed? This condition may be diagnosed based on:  Your symptoms and medical history. Your health care provider may ask about: ? Your sleep habits. ? Any medical conditions you have. ? Your mental health.  A physical exam. How is this treated? Treatment for insomnia depends on the cause. Treatment may focus on treating an underlying condition that is causing insomnia. Treatment may also include:  Medicines to help you sleep.  Counseling or therapy.  Lifestyle adjustments to help you sleep better. Follow these instructions at home: Eating and drinking   Limit or avoid alcohol, caffeinated beverages, and cigarettes, especially close to bedtime. These can disrupt your sleep.  Do not eat a large meal or eat spicy foods right before bedtime. This can lead to digestive discomfort that can make it hard for you to sleep. Sleep habits   Keep a sleep diary to help you and your health care provider figure out what could be causing your insomnia. Write down: ? When you sleep. ? When you wake up during the night. ? How well you sleep. ? How rested you feel the next day. ? Any side effects of medicines you are taking. ? What you eat and drink.  Make your bedroom a dark, comfortable place where it is easy to fall asleep. ? Put up shades or blackout curtains to block light from outside. ? Use a white noise machine to block noise. ? Keep the temperature cool.  Limit screen use before bedtime. This includes: ? Watching TV. ? Using your smartphone, tablet, or computer.  Stick to a routine that includes going to bed and waking up at the same times every day and night. This can help you  fall asleep faster. Consider making a quiet activity, such as reading, part of your nighttime routine.  Try to avoid taking naps during the day so that you sleep better at night.  Get out of bed if you are still awake after 15 minutes of trying to sleep.  Keep the lights down, but try reading or doing a quiet activity. When you feel sleepy, go back to bed. General instructions  Take over-the-counter and prescription medicines only as told by your health care provider.  Exercise regularly, as told by your health care provider. Avoid exercise starting several hours before bedtime.  Use relaxation techniques to manage stress. Ask your health care provider to suggest some techniques that may work well for you. These may include: ? Breathing exercises. ? Routines to release muscle tension. ? Visualizing peaceful scenes.  Make sure that you drive carefully. Avoid driving if you feel very sleepy.  Keep all follow-up visits as told by your health care provider. This is important. Contact a health care provider if:  You are tired throughout the day.  You have trouble in your daily routine due to sleepiness.  You continue to have sleep problems, or your sleep problems get worse. Get help right away if:  You have serious thoughts about hurting yourself or someone else. If you ever feel like you may hurt yourself or others, or have thoughts about taking your own life, get help right away. You can go to your nearest emergency department or call:  Your local emergency services (911 in the [place]  A suicide crisis helpline, such as the [facility] at 810-468-6541. This is open 24 hours a day. Summary  Insomnia is a sleep disorder that makes it difficult to fall asleep or stay asleep.  Insomnia can be long-term (chronic) or short-term (acute).  Treatment for insomnia depends on the cause. Treatment may focus on treating an underlying condition that is causing insomnia.  Keep a sleep diary to help you and your health care provider figure out what could be causing your insomnia. This information is not intended to replace advice given to you by your health care provider. Make sure you discuss any questions you have with  your health care provider. Document Released: 10/28/2000 Document Revised: 10/13/2017 Document Reviewed: 08/10/2017 Elsevier Patient Education  2020 [employer].

## 2019-08-07 ENCOUNTER — Ambulatory Visit: Payer: Self-pay | Admitting: Physical Therapy

## 2019-09-04 ENCOUNTER — Other Ambulatory Visit: Payer: Self-pay

## 2019-09-04 ENCOUNTER — Ambulatory Visit (INDEPENDENT_AMBULATORY_CARE_PROVIDER_SITE_OTHER): Payer: Medicare Other | Admitting: Internal Medicine

## 2019-09-04 ENCOUNTER — Encounter: Payer: Self-pay | Admitting: Internal Medicine

## 2019-09-04 VITALS — BP 114/72 | HR 63 | Ht 64.0 in | Wt 124.0 lb

## 2019-09-04 DIAGNOSIS — I10 Essential (primary) hypertension: Secondary | ICD-10-CM | POA: Diagnosis not present

## 2019-09-04 DIAGNOSIS — I480 Paroxysmal atrial fibrillation: Secondary | ICD-10-CM | POA: Diagnosis not present

## 2019-09-04 NOTE — Patient Instructions (Addendum)
Medication Instructions:  Dr Debara Pickett recommends that you continue on your current medications as directed. Please refer to the Current Medication list given to you today.  *If you need a refill on your cardiac medications before your next appointment, please call your pharmacy*  Follow-Up: At [facility], you and your health needs are our priority.  As part of our continuing mission to provide you with exceptional heart care, we have created designated Provider Care Teams.  These Care Teams include your primary Cardiologist (physician) and Advanced Practice Providers (APPs -  Physician Assistants and Nurse Practitioners) who all work together to provide you with the care you need, when you need it.  Your next appointment:   12 months - You will receive a reminder letter in the mail two months in advance. If you don't receive a letter, please call our office to schedule the follow-up appointment.  The format for your next appointment:   In Person  Provider:   You may see Pixie Casino, MD or one of the following Advanced Practice Providers on your designated Care Team:    Almyra Deforest, PA-C  Fabian Sharp, PA-C or   Roby Lofts, [place]

## 2019-09-04 NOTE — Progress Notes (Signed)
OFFICE NOTE  Chief Complaint:  No complaints  Primary Care Physician: Kelsey Major, MD  HPI:  Kelsey James is a pleasant 78 year old female with little past medical history. For years she's had very low blood pressure however recently her blood pressure has spiked up into the upper 180's to over [id number] systolic. She recently presented to the emergency room and any pain with symptoms such as throbbing in her neck and head with uncontrolled blood pressure. Subsequently she was started on low-dose amlodipine and she's had a nice improvement in her blood pressure. She does however report lower extremity swelling in her feet. She also describes what I believe is chest tightness. She actually says that she feels that her chest is "caving in" or "deflating". There is some associated sporadic shortness of breath but it has improved with her blood pressure medicine. She does do some activity and exercise and reports that her symptoms improve with exercise.  Kelsey James had a recent nuclear stress test which demonstrated reversible distal anteroapical and anterolateral reversible ischemia. EF of 69%.  I interpreted as intermediate risk due to the distal location of the ischemia however is abnormal. She is relatively thin and I did not suspect this is due to attenuation artifact.  Ultimately she was referred for cardiac catheterization.  This was performed by myself and demonstrated normal coronary arteries. Subsequently she been having palpitations and I arranged for a monitor. She wore that monitor her for 2 weeks between 03/28/2014 and 04/11/2014. Total monitoring time was 288 hours and 50 minutes. Lowest heart rate was 41 and average heart rate was 59. She was noted to have PACs, short runs of PSVT up to 14 beats were also noted.  This likely explains her palpitations.  Given her bradycardia, I recommended discontinuing her timolol, which is the only medication that I can see which could  have been causing her bradycardia. She now presents today as a walk-in with feelings of her heart thumping. She was playing progressive bridge today and she started having heaviness in her chest. She felt like her heart was racing and she was short of breath, dizzy and like she was going to pass out. An EKG was performed in the office today which showed normal sinus rhythm at 71. Blood pressure was normal.  Kelsey James returns today for follow-up. In the interim she is seeing Dr. Jolyn Nap for evaluation of her palpitations and arrhythmia. She was felt to have 2 separate items. The first being palpitations and an ectopic atrial tachycardia with PACs which he felt were most likely benign given her normal coronary anatomy. The second was concerning more for atrial fibrillation, which relates to the episodes where she feels like her chest is "caving in". I discussed multiple ways to try to evaluate for arrhythmias, but given the infrequency of the events, ultimately he recommended an implantable loop recorder. She did undergo successful loop recorder placement and has had 2 transmissions that I reviewed, neither of which have demonstrated any atrial fibrillation. She continues to have some palpitations. She does related somewhat to caffeine and particular chocolate. She reports eating moose tracks ice cream every evening. I did ask that she may want to decrease some of her chocolate intake. Recently she's had problems with hypokalemia. We will plan to recheck a BMET today.  Kelsey James was seen by Dr. Caryl Comes after there was identification of a paroxysmal atrial tachycardia on her implanted loop recorder. He  recommended she start on flecainide 50 mg twice daily. Since starting this medicine she reports feeling horrible. Subsequently she followed up with Chanetta Marshall, NP, who recommended that she discontinue her flecainide. There is been consideration about alternative antiarrhythmics. At this time she wishes to  stay off of anti-rhythmic medication.  I had the pleasure seeing [name] back today in the office. In the end of December she underwent ablation of an inducible atrial tachycardia by Dr. Curt Bears. This appears to been fairly successful at reducing her burden of atrial tachycardia. Although she still has had some breakthroughs. She does have an implantable loop recorder which will be interrogated this month. She has a follow-up appointment with him next week. Her only other concern is that she is having some labile blood pressures. She says she's gained 45 pounds over the holidays and notes her blood pressures a little higher. At times he goes up to 170 but generally ranges between 120 and 140. I do not necessarily see a need to treat that at this time. I asked her to keep records of her blood pressures over the next several weeks.  03/30/2016   Kelsey James returns today for follow-up. Overall she feels like she is doing well. She had as mentioned undergone ablation of an inducible atrial tachycardia by Dr. Curt Bears. She says she still has very infrequent episodes about 2 or 3 times a month of palpitations.  Blood pressure has again been elevated and in the past she had been on amlodipine. She's had some labile blood pressures as evidenced by a recent ophthalmology visit were blood pressure was 161/90.   However blood pressure today is normal at 128/70. There may be an anxiety component to this. She was placed on carvedilol which she felt very bad on and did not want to take beta blockers. She was then given diltiazem and HCTZ. She feels that the diltiazem is not helpful enough for her blood pressure.  We did discuss some alternatives. In the past she been on amlodipine however that is not likely to give her any benefit with her palpitations. Another option would be an older calcium channel blocker such as verapamil , although more likely to have side effects such as constipation and  swelling.  05/12/2016  Kelsey James returns today for follow-up. In the interim she saw Erasmo Downer our anticoagulation and hypertension pharmacist. Medications adjustments were made and she was taken off of diltiazem and is currently on amlodipine 5 mg daily. Blood pressure today was excellent 112/70. I checked her blood pressure with her home cuff which read 130/80. I then repeated her blood pressure which was 110/70 manual. This suggests her blood pressure cuff may be about 15-20 points higher than it should be. I advised that she may wish to get a new blood pressure cuff. She is interested in getting her loop recorder explant it as she's had no further fast palpitations after atrial tachycardia ablation.  02/07/2017  Kelsey James returns today for follow-up. She seems to be doing well on Xarelto for anticoagulation. She had recently seen Dr. Curt Bears and has a follow-up with him in May. She has an implanted loop recorder and is interested in having that removed. Overall she is without complaints. She is not on any AV nodal blocker but remains in sinus rhythm at 63 with PACs today.  07/18/2017  Kelsey James was seen in follow-up today. Xarelto and has not had bleeding difficulty. She occasionally gets palpitations however this may be related to anxiety. She  has an implanted loop recorder and sees Dr.  Curt Bears in November to have it removed. Blood pressure is at goal today.  08/28/2018  Kelsey James returns today for follow-up.  She is doing well without any recurrent palpitations of significance.  Her loop recorder was explanted in 2018.  She was found to have A. fib and is doing well on Xarelto.  No bleeding issues.   09/04/2019  Kelsey James is seen today in follow-up.  She seems to be doing well.  She has no significant palpitations.  She is not aware of any A. fib currently.  Her EKG today shows sinus rhythm.  She does remain on apixaban for anticoagulation.  Blood pressure is well controlled today.  PMHx:   Past Medical History:  Diagnosis Date   A-fib [facility])    Arthritis    "hands" (11/04/2015)   Dry eyes    Glaucoma    Osteopenia    Rosacea    Wears glasses     Past Surgical History:  Procedure Laterality Date   ATRIAL TACH ABLATION  11/04/2015   COLONOSCOPY     ELECTROPHYSIOLOGIC STUDY N/A 11/04/2015   Procedure: A-Tach Ablation;  Surgeon: Will [name], MD;  Location: [place] CV LAB;  Service: Cardiovascular;  Laterality: N/A;   LEFT HEART CATHETERIZATION WITH CORONARY ANGIOGRAM N/A 03/12/2014   Procedure: LEFT HEART CATHETERIZATION WITH CORONARY ANGIOGRAM;  Surgeon: Pixie Casino, MD;  Location: [facility] CATH LAB;  Service: Cardiovascular;  Laterality: N/A;   LOOP RECORDER IMPLANT N/A 09/15/2014   Procedure: LOOP RECORDER IMPLANT;  Surgeon: Deboraha Sprang, MD;  Location: [facility] CATH LAB;  Service: Cardiovascular;  Laterality: N/A;   LOOP RECORDER REMOVAL N/A 09/26/2017   Procedure: LOOP RECORDER REMOVAL;  Surgeon: Constance Haw, MD;  Location: [place] CV LAB;  Service: Cardiovascular;  Laterality: N/A;   MASS EXCISION Right 09/25/2014   Procedure: RIGHT RING FINGER EXCISION MASS AND DEBRIDEMENT DIP JOINT;  Surgeon: Leanora Cover, MD;  Location: [place];  Service: Orthopedics;  Laterality: Right;   TONSILLECTOMY AND ADENOIDECTOMY  1979   TUBAL LIGATION      FAMHx:  Family History  Problem Relation Age of Onset   Valvular heart disease Mother    Depression Father    Cancer Maternal Grandmother     SOCHx:   reports that she quit smoking about 16 years ago. Her smoking use included cigarettes. She has a 12.50 pack-year smoking history. She has never used smokeless tobacco. She reports previous alcohol use. She reports that she does not use drugs.  ALLERGIES:  Allergies  Allergen Reactions   Dorzolamide Hcl-Timolol Mal Other (See Comments)    Also made dry eyes worse   Hydrocortisone Other (See Comments)    Steroid induced  glaucoma   Other Nausea And Vomiting    Mushrooms    Shellfish Allergy Nausea And Vomiting    ROS: Pertinent items noted in HPI and remainder of comprehensive ROS otherwise negative.  HOME MEDS: Current Outpatient Medications  Medication Sig Dispense Refill   acetaminophen (TYLENOL) 500 MG tablet Take 500 mg every 6 (six) hours as needed by mouth (pain).      ALPRAZolam (XANAX) 0.25 MG tablet TAKE 1 TABLET BY MOUTH AT BEDTIME AS NEEDED FOR ANXIETY 30 tablet 1   amLODipine (NORVASC) 5 MG tablet TAKE 1.5 TABLETS BY MOUTH DAILY 135 tablet 2   Calcium-Magnesium (CALCIUM MAGNESIUM 750) 300-300 MG TABS Take 1 tablet by mouth 2 (two) times daily.  hydroxypropyl methylcellulose (ISOPTO TEARS) 2.5 % ophthalmic solution Place 1 drop into both eyes as needed for dry eyes.     KLOR-CON M20 20 MEQ tablet TAKE 1 TABLET (20 MEQ TOTAL) BY MOUTH DAILY AS NEEDED. 90 tablet 1   LECITHIN PO Take 1 capsule 2 (two) times daily by mouth.     Omega-3 Fatty Acids (FISH OIL PO) Take 1 capsule by mouth 2 (two) times daily.      omeprazole (PRILOSEC) 40 MG capsule Take 40 mg by mouth daily.     travoprost, benzalkonium, (TRAVATAN) 0.004 % ophthalmic solution Place 1 drop at bedtime into both eyes.      apixaban (ELIQUIS) 5 MG TABS tablet Take 1 tablet (5 mg total) by mouth 2 (two) times daily. (Patient not taking: Reported on 08/06/2019) 60 tablet 11   chlorthalidone (HYGROTON) 25 MG tablet TAKE 0.5 TABLETS (12.5 MG TOTAL) BY MOUTH DAILY. 45 tablet 3   No current facility-administered medications for this visit.     LABS/IMAGING: No results found for this or any previous visit (from the past 48 hour(s)). No results found.  VITALS: BP 114/72    Pulse 63    Ht 5\' 4"  (1.626 m)    Wt 124 lb (56.2 kg)    SpO2 100%    BMI 21.28 kg/m   EXAM: General appearance: alert and no distress Neck: no carotid bruit, no JVD and thyroid not enlarged, symmetric, no tenderness/mass/nodules Lungs: clear to  auscultation bilaterally Heart: regular rate and rhythm, S1, S2 normal, no murmur, click, rub or gallop Abdomen: soft, non-tender; bowel sounds normal; no masses,  no organomegaly Extremities: extremities normal, atraumatic, no cyanosis or edema Pulses: 2+ and symmetric Skin: Skin color, texture, turgor normal. No rashes or lesions Neurologic: Grossly normal Psych: Pleasant  EKG: Normal sinus rhythm at 63-personally reviewed  ASSESSMENT: 1. Labile hypertension - improved 2. Palpitations - resolved (s/p loop recorder still implanted) 3. Ectopic atrial tachycardia status post ablation 4. New onset atrial fibrillation - now on Eliquis, CHADSVASC score of 3  PLAN: 1.   Kelsey James has had some labile hypertension in the past however that is improved.  Her palpitations are not active currently.  She is currently in sinus rhythm and is on Eliquis without bleeding difficulty.  No changes were made to her medicines today.  Follow-up annually or sooner as necessary.  Pixie Casino, MD, [facility], [place] Director of the [facility] Lipid [facility]  Cardiovascular [facility] Reduction [facility] Diplomate of the American Board of Clinical Lipidology Attending Cardiologist  Direct Dial: [phone]   Fax: [phone]  Website:  [email or web address] 09/04/2019, 1:48 PM

## 2019-09-05 ENCOUNTER — Encounter: Payer: Self-pay | Admitting: Internal Medicine

## 2019-09-19 ENCOUNTER — Other Ambulatory Visit: Payer: Self-pay

## 2019-09-19 DIAGNOSIS — Z20822 Contact with and (suspected) exposure to covid-19: Secondary | ICD-10-CM

## 2019-09-21 LAB — NOVEL CORONAVIRUS, NAA: SARS-CoV-2, NAA: NOT DETECTED

## 2019-11-19 ENCOUNTER — Other Ambulatory Visit: Payer: Self-pay | Admitting: Internal Medicine

## 2020-01-22 ENCOUNTER — Other Ambulatory Visit: Payer: Self-pay | Admitting: Internal Medicine

## 2020-01-22 NOTE — Progress Notes (Signed)
Cardiology Office Note Date:  01/23/2020  Patient ID:  Kelsey James 1941/04/30, MRN [id number] PCP:  Nickola Major, MD  Cardiologist:  Dr. Debara Pickett Electrophysiologist: Dr. Curt Bears    Chief Complaint:  SOB  History of Present Illness: Kelsey James is a 79 y.o. female with history of ATach (ablated), AFib, labile HTN.  She comes in today to be seen for Dr. Curt Bears.  Last seen by him Aug 2020, at that time doing well, not walking as much 2/2 pain (?), apparently after a fall, pt felt slow recovery/healing was 2/2 the xarelto and she was changed to Eliquis,.    More recently she saw Dr. Debara Pickett Oct 2020, was doing well, unaware of any Afib, BP was controlled, planned for an annual visit.  Saw her PMD 01/16/2020 with what sounds episodic SOB/CP?  Planned for ddimer and BMET, also mentions at cardiology was changed to eliquis, though not taking it? K+ 3.4 Creat 0.47 DDimer was 250 (wnl) EKG reportedly SR though had episode of SOB after completed  She in the last couple months has been feeling some palpitations and SOB.  They are not particularly connected but can occur together.  They are worrisome to her.  She has not identified any pattern or triggers.  They seem to be increasing in frequency, to just about daily.   The palpitations are fast beats, fairly short lived but definitely get her attention, do not necessarily make her feel bad.  The SOB is e[isodic, seems to happen without rhyme or reason, and she will concentrate on slowing her breathing down and it will pass.  She checks her BP/HR daily, BP always good, 115 or so/70's, HR she is worried about getting some slower number 45 -70's  She will have a fleeting grab type feeling in her chest, not exertional or positional and not connected to any of her symptoms with any regularity.  She will occassionally feel a little lightheaded, never near fainting or any syncope.  He husband has been quite ill  Early this year, she  mentions/asks if anxiety could be contributing.  He is currently doing better from his heart failure, but she worries about him.  She is planned for b/l eye surgery for her glaucoma, states her eye doctor told her tos hold her Eliquis for 2 weeks and she has stopped it she says at his direction until after her surgery.    Device information MDT ILR, implanted 09/15/2014 for palpitations  >>> removed 09/26/2017  Atrial arrhythmia Hx 2016 >> Atach > ablated by Dr. Curt Bears (at 1:00 on the tricuspid valve) 2017 >> AFib  AAD Hx Remotely on Flecainide >> stopped made her feel "Terrible" Intolerant of coreg made her feel "Very bad" and did not want betablockers  Past Medical History:  Diagnosis Date  . A-fib ([place])   . Arthritis    "hands" (11/04/2015)  . Dry eyes   . Glaucoma   . Osteopenia   . Rosacea   . Wears glasses     Past Surgical History:  Procedure Laterality Date  . ATRIAL TACH ABLATION  11/04/2015  . COLONOSCOPY    . ELECTROPHYSIOLOGIC STUDY N/A 11/04/2015   Procedure: A-Tach Ablation;  Surgeon: Will [name], MD;  Location: [place] CV LAB;  Service: Cardiovascular;  Laterality: N/A;  . LEFT HEART CATHETERIZATION WITH CORONARY ANGIOGRAM N/A 03/12/2014   Procedure: LEFT HEART CATHETERIZATION WITH CORONARY ANGIOGRAM;  Surgeon: Pixie Casino, MD;  Location: [facility] CATH LAB;  Service:  Cardiovascular;  Laterality: N/A;  . LOOP RECORDER IMPLANT N/A 09/15/2014   Procedure: LOOP RECORDER IMPLANT;  Surgeon: Deboraha Sprang, MD;  Location: [facility] CATH LAB;  Service: Cardiovascular;  Laterality: N/A;  . LOOP RECORDER REMOVAL N/A 09/26/2017   Procedure: LOOP RECORDER REMOVAL;  Surgeon: Constance Haw, MD;  Location: [place] CV LAB;  Service: Cardiovascular;  Laterality: N/A;  . MASS EXCISION Right 09/25/2014   Procedure: RIGHT RING FINGER EXCISION MASS AND DEBRIDEMENT DIP JOINT;  Surgeon: Leanora Cover, MD;  Location: [place];  Service: Orthopedics;   Laterality: Right;  . TONSILLECTOMY AND ADENOIDECTOMY  1979  . TUBAL LIGATION      Current Outpatient Medications  Medication Sig Dispense Refill  . acetaminophen (TYLENOL) 500 MG tablet Take 500 mg every 6 (six) hours as needed by mouth (pain).     Marland Kitchen ALPRAZolam (XANAX) 0.25 MG tablet TAKE 1 TABLET BY MOUTH AT BEDTIME AS NEEDED FOR ANXIETY 30 tablet 1  . amLODipine (NORVASC) 5 MG tablet TAKE 1.5 TABLETS BY MOUTH DAILY 135 tablet 2  . apixaban (ELIQUIS) 5 MG TABS tablet Take 1 tablet (5 mg total) by mouth 2 (two) times daily. 60 tablet 11  . Calcium-Magnesium (CALCIUM MAGNESIUM 750) 300-300 MG TABS Take 1 tablet by mouth 2 (two) times daily.     . chlorthalidone (HYGROTON) 25 MG tablet Take 0.5 tablets (12.5 mg total) by mouth daily. 45 tablet 3  . hydroxypropyl methylcellulose (ISOPTO TEARS) 2.5 % ophthalmic solution Place 1 drop into both eyes as needed for dry eyes.    Marland Kitchen KLOR-CON M20 20 MEQ tablet TAKE 1 TABLET (20 MEQ TOTAL) BY MOUTH DAILY AS NEEDED. 90 tablet 1  . LECITHIN PO Take 1 capsule 2 (two) times daily by mouth.    . Omega-3 Fatty Acids (FISH OIL PO) Take 1 capsule by mouth 2 (two) times daily.     Marland Kitchen omeprazole (PRILOSEC) 40 MG capsule Take 40 mg by mouth daily.    . Timolol Maleate PF 0.5 % SOLN Place 0.5 drops into both eyes daily.    . Travoprost, BAK Free, (TRAVATAN Z) 0.004 % SOLN ophthalmic solution Place 0.004 drops into both eyes daily.    . travoprost, benzalkonium, (TRAVATAN) 0.004 % ophthalmic solution Place 1 drop at bedtime into both eyes.      No current facility-administered medications for this visit.    Allergies:   Prednisone, Dorzolamide hcl-timolol mal, Hydrocortisone, Other, and Shellfish allergy   Social History:  The patient  reports that she quit smoking about 17 years ago. Her smoking use included cigarettes. She has a 12.50 pack-year smoking history. She has never used smokeless tobacco. She reports previous alcohol use. She reports that she does not  use drugs.   Family History:  The patient's family history includes Cancer in her maternal grandmother; Depression in her father; Valvular heart disease in her mother.  ROS:  Please see the history of present illness.  All other systems are reviewed and otherwise negative.   PHYSICAL EXAM:  VS:  BP 108/60   Pulse 60   Ht 5\' 4"  (1.626 m)   Wt 120 lb (54.4 kg)   SpO2 98%   BMI 20.60 kg/m  BMI: Body mass index is 20.6 kg/m. Well nourished, thin body habitus, though well developed, in no acute distress  HEENT: normocephalic, atraumatic  Neck: no JVD, carotid bruits or masses Cardiac:  RRR; no significant murmurs, no rubs, or gallops Lungs:  CTA b/l, no wheezing,  rhonchi or rales  Abd: soft, nontender MS: no deformity, age appropriate atrophy Ext:  no edema  Skin: warm and dry, no rash Neuro:  No gross deficits appreciated Psych: euthymic mood, full affect    EKG:  Done today and reviewed by myself shows  SR 60bpm, PAC/PJS, no acute ST/T changes   11/04/2015: EPS/Ablation CONCLUSIONS:  1. Sinus rhythm upon presentation.  2. Atrial tachycardia induced with isuprel 3. Successful radiofrequency modification of the slow AV nodal pathway  4. No inducible arrhythmias following ablation.  5. No early apparent complications.  Recent Labs: 05/31/2019: Hemoglobin 12.9; Platelets 280 07/02/2019: BUN 8; Creatinine, Ser 0.48; Potassium 3.8; Sodium 133  No results found for requested labs within last 8760 hours.   CrCl cannot be calculated (Patient's most recent lab result is older than the maximum 21 days allowed.).   Wt Readings from Last 3 Encounters:  01/23/20 120 lb (54.4 kg)  09/04/19 124 lb (56.2 kg)  08/06/19 121 lb (54.9 kg)     Other studies reviewed: Additional studies/records reviewed today include: summarized above  ASSESSMENT AND PLAN:  1. Arial tachycardia     S/p EPS ablation   2. Paroxysmal Afib     CHA2DS2Vasc is 3, on Eliquis appropriately  dosed  Unclear that her symptoms or rhythm related, will have her wear a 1 week Zio XT      3. HTN     Looks ok  4. SOB     Lungs are clear, normal Ddimer by her PMD, no symptoms of illness, coughing     Episodic     Suspect anxiety, though will get echo    She is asked to call and confirm her instructions from the opthalmologist her instructions for the eliquis    Disposition: F/u in 2 months, sooner if needed   After the patient left, decided to get CXR for completeness, I will ask my MA to reach out to the patient and schedule.   Current medicines are reviewed at length with the patient today.  The patient did not have any concerns regarding medicines.  Venetia Night, PA-C 01/23/2020 3:47 PM     [place] [address] [phone] (office)  [phone] (fax)

## 2020-01-23 ENCOUNTER — Other Ambulatory Visit: Payer: Self-pay

## 2020-01-23 ENCOUNTER — Ambulatory Visit (INDEPENDENT_AMBULATORY_CARE_PROVIDER_SITE_OTHER): Payer: Medicare Other | Admitting: Physician Assistant

## 2020-01-23 ENCOUNTER — Telehealth: Payer: Self-pay | Admitting: Radiology

## 2020-01-23 VITALS — BP 108/60 | HR 60 | Ht 64.0 in | Wt 120.0 lb

## 2020-01-23 DIAGNOSIS — R002 Palpitations: Secondary | ICD-10-CM

## 2020-01-23 DIAGNOSIS — R0602 Shortness of breath: Secondary | ICD-10-CM

## 2020-01-23 DIAGNOSIS — I10 Essential (primary) hypertension: Secondary | ICD-10-CM

## 2020-01-23 DIAGNOSIS — I480 Paroxysmal atrial fibrillation: Secondary | ICD-10-CM | POA: Diagnosis not present

## 2020-01-23 NOTE — Patient Instructions (Signed)
Medication Instructions:   Your physician recommends that you continue on your current medications as directed. Please refer to the Current Medication list given to you today.   *If you need a refill on your cardiac medications before your next appointment, please call your pharmacy*   Lab Work:   CBC TODAY   If you have labs (blood work) drawn today and your tests are completely normal, you will receive your results only by: Marland Kitchen MyChart Message (if you have MyChart) OR . A paper copy in the mail If you have any lab test that is abnormal or we need to change your treatment, we will call you to review the results.   Testing/Procedures: Your physician has requested that you have an echocardiogram. Echocardiography is a painless test that uses sound waves to create images of your heart. It provides your doctor with information about the size and shape of your heart and how well your heart's chambers and valves are working. This procedure takes approximately one hour. There are no restrictions for this procedure.   Your physician has recommended that you wear an event monitor. Event monitors are medical devices that record the heart's electrical activity. Doctors most often [place] these monitors to diagnose arrhythmias. Arrhythmias are problems with the speed or rhythm of the heartbeat. The monitor is a small, portable device. You can wear one while you do your normal daily activities. This is usually used to diagnose what is causing palpitations/syncope (passing out).   Follow-Up: At [facility], you and your health needs are our priority.  As part of our continuing mission to provide you with exceptional heart care, we have created designated Provider Care Teams.  These Care Teams include your primary Cardiologist (physician) and Advanced Practice Providers (APPs -  Physician Assistants and Nurse Practitioners) who all work together to provide you with the care you need, when you need it.  We  recommend signing up for the patient portal called "MyChart".  Sign up information is provided on this After Visit Summary.  MyChart is used to connect with patients for Virtual Visits (Telemedicine).  Patients are able to view lab/test results, encounter notes, upcoming appointments, etc.  Non-urgent messages can be sent to your provider as well.   To learn more about what you can do with MyChart, go to [email or web address].    Your next appointment:   2 month(s)  The format for your next appointment:   In Person  Provider:   You may see :  Tommye Standard, PA-C     Other Instructions  ZIO XT- Long Term Monitor Instructions   Your physician has requested you wear your ZIO patch monitor___7____days.   This is a single patch monitor.  [employer] supplies one patch monitor per enrollment.  Additional stickers are not available.   Please do not apply patch if you will be having a Nuclear Stress Test, Echocardiogram, Cardiac CT, MRI, or Chest Xray during the time frame you would be wearing the monitor. The patch cannot be worn during these tests.  You cannot remove and re-apply the ZIO XT patch monitor.   Your ZIO patch monitor will be sent USPS Priority mail from [facility] directly to your home address. The monitor may also be mailed to a PO BOX if home delivery is not available.   It may take 3-5 days to receive your monitor after you have been enrolled.   Once you have received you monitor, please review enclosed instructions.  Your monitor has already  been registered assigning a specific monitor serial # to you.   Applying the monitor   Shave hair from upper left chest.   Hold abrader disc by orange tab.  Rub abrader in 40 strokes over left upper chest as indicated in your monitor instructions.   Clean area with 4 enclosed alcohol pads .  Use all pads to assure are is cleaned thoroughly.  Let dry.   Apply patch as indicated in monitor instructions.  Patch will be place  under collarbone on left side of chest with arrow pointing upward.   Rub patch adhesive wings for 2 minutes.Remove white label marked "1".  Remove white label marked "2".  Rub patch adhesive wings for 2 additional minutes.   While looking in a mirror, press and release button in center of patch.  A small green light will flash 3-4 times .  This will be your only indicator the monitor has been turned on.     Do not shower for the first 24 hours.  You may shower after the first 24 hours.   Press button if you feel a symptom. You will hear a small click.  Record Date, Time and Symptom in the Patient Log Book.   When you are ready to remove patch, follow instructions on last 2 pages of Patient Log Book.  Stick patch monitor onto last page of Patient Log Book.   Place Patient Log Book in [place] box.  Use locking tab on box and tape box closed securely.  The [employer] has [employer] on it.  Please place in mailbox as soon as possible.  Your physician should have your test results approximately 7 days after the monitor has been mailed back to [facility].   Call [place] at [phone] if you have questions regarding your ZIO XT patch monitor.  Call them immediately if you see an orange light blinking on your monitor.   If your monitor falls off in less than 4 days contact our Monitor department at [phone].  If your monitor becomes loose or falls off after 4 days call [employer] at [phone] for suggestions on securing your monitor.

## 2020-01-23 NOTE — Telephone Encounter (Signed)
Enrolled patient for a 7 day Zio monitor to be mailed to patients home.  

## 2020-01-24 LAB — CBC
Hematocrit: 37.1 % (ref 34.0–46.6)
Hemoglobin: 12.6 g/dL (ref 11.1–15.9)
MCH: 30.1 pg (ref 26.6–33.0)
MCHC: 34 g/dL (ref 31.5–35.7)
MCV: 89 fL (ref 79–97)
Platelets: 252 10*3/uL (ref 150–450)
RBC: 4.19 x10E6/uL (ref 3.77–5.28)
RDW: 14.4 % (ref 11.7–15.4)
WBC: 5.7 10*3/uL (ref 3.4–10.8)

## 2020-01-24 NOTE — Addendum Note (Signed)
Addended by: Claude Manges on: 01/24/2020 08:13 AM   Modules accepted: Orders

## 2020-02-11 ENCOUNTER — Other Ambulatory Visit (HOSPITAL_COMMUNITY): Payer: Medicare Other

## 2020-02-25 ENCOUNTER — Other Ambulatory Visit: Payer: Self-pay

## 2020-02-25 ENCOUNTER — Ambulatory Visit (HOSPITAL_COMMUNITY): Payer: Medicare Other | Attending: Internal Medicine

## 2020-02-25 DIAGNOSIS — R002 Palpitations: Secondary | ICD-10-CM | POA: Insufficient documentation

## 2020-02-25 DIAGNOSIS — R0602 Shortness of breath: Secondary | ICD-10-CM | POA: Diagnosis present

## 2020-02-26 ENCOUNTER — Other Ambulatory Visit (INDEPENDENT_AMBULATORY_CARE_PROVIDER_SITE_OTHER): Payer: Medicare Other

## 2020-02-26 DIAGNOSIS — R0602 Shortness of breath: Secondary | ICD-10-CM

## 2020-02-26 DIAGNOSIS — R002 Palpitations: Secondary | ICD-10-CM | POA: Diagnosis not present

## 2020-03-21 ENCOUNTER — Other Ambulatory Visit: Payer: Self-pay | Admitting: Internal Medicine

## 2020-08-11 ENCOUNTER — Encounter: Payer: Medicare Other | Admitting: Nurse Practitioner

## 2020-09-07 ENCOUNTER — Telehealth: Payer: Self-pay

## 2020-09-07 ENCOUNTER — Encounter: Payer: Self-pay | Admitting: Physician Assistant

## 2020-09-07 ENCOUNTER — Telehealth (INDEPENDENT_AMBULATORY_CARE_PROVIDER_SITE_OTHER): Payer: Medicare Other | Admitting: Physician Assistant

## 2020-09-07 VITALS — BP 131/75 | HR 68 | Ht 64.0 in | Wt 120.0 lb

## 2020-09-07 DIAGNOSIS — I480 Paroxysmal atrial fibrillation: Secondary | ICD-10-CM | POA: Diagnosis not present

## 2020-09-07 NOTE — Telephone Encounter (Signed)
Called patient to discuss AVS instructions gave Hao Meng's recommendations and patient voiced understanding. AVS summary mailed to patient.    

## 2020-09-07 NOTE — Patient Instructions (Signed)
Medication Instructions:  Your physician recommends that you continue on your current medications as directed. Please refer to the Current Medication list given to you today.  *If you need a refill on your cardiac medications before your next appointment, please call your pharmacy*  Lab Work: NONE ordered at this time of appointment   If you have labs (blood work) drawn today and your tests are completely normal, you will receive your results only by: . MyChart Message (if you have MyChart) OR . A paper copy in the mail If you have any lab test that is abnormal or we need to change your treatment, we will call you to review the results.  Testing/Procedures: NONE ordered at this time of appointment   Follow-Up: At [facility], you and your health needs are our priority.  As part of our continuing mission to provide you with exceptional heart care, we have created designated Provider Care Teams.  These Care Teams include your primary Cardiologist (physician) and Advanced Practice Providers (APPs -  Physician Assistants and Nurse Practitioners) who all work together to provide you with the care you need, when you need it.  Your next appointment:   1 year(s)  The format for your next appointment:   In Person  Provider:   K. Chad Hilty, MD  Other Instructions   

## 2020-09-07 NOTE — Progress Notes (Signed)
Virtual Visit via Telephone Note   This visit type was conducted due to national recommendations for restrictions regarding the COVID-19 Pandemic (e.g. social distancing) in an effort to limit this patient's exposure and mitigate transmission in our community.  Due to her co-morbid illnesses, this patient is at least at moderate risk for complications without adequate follow up.  This format is felt to be most appropriate for this patient at this time.  The patient did not have access to video technology/had technical difficulties with video requiring transitioning to audio format only (telephone).  All issues noted in this document were discussed and addressed.  No physical exam could be performed with this format.  Please refer to the patient's chart for her  consent to telehealth for [facility]    Date:  09/09/2020   ID:  Kelsey James, DOB 10/03/1941, MRN [id number] The patient was identified using 2 identifiers.  Patient Location: Home Provider Location: Office/Clinic  PCP:  Nickola Major, MD  Cardiologist:  Pixie Casino, MD  Electrophysiologist:  Constance Haw, MD   Evaluation Performed:  Follow-Up Visit  Chief Complaint:  Follow up  History of Present Illness:    Kelsey James is a 79 y.o. female with past medical history atrial tach ablation, PAF and a normal coronary arteries on cardiac catheterization.  Previous Myoview demonstrated reversible distal anteroseptal and anterolateral ischemia, EF 69%.  Cardiac catheterization revealed normal coronary arteries.  2-week heart monitor performed in May 2015 showed short runs of PSVT and PACs.  Subsequent loop recorder demonstrated paroxysmal atrial tachycardia, this was treated by Dr. Caryl Comes with initiation of flecainide 50 mg twice a day.  She eventually underwent atrial tachycardia ablation by Dr. Curt Bears in 2016.  Loop recorder later confirmed the presence of atrial fibrillation and she was started on Xarelto.   Xarelto was later transitioned to Eliquis.  Eventually her loop recorder was explanted in 2018.  She was seen by EP service in March 2021 and complained of recurrent palpitation, 1 week ZIO monitor was recommended which did not reveal any atrial fibrillation.  She is also complained of shortness of breath, echocardiogram performed on 02/25/2020 showed EF 60 to 65%, grade 2 DD, RVSP 23.8 mmHg, severe LAE, borderline bileaflet mitral valve prolapse.  Patient presents today for follow-up.  She denies any recent prolonged palpitation.  She still has occasional thumping sensation in the chest which is likely related to PACs or PVCs.  She denies any exertional chest pain or shortness of breath.  She can follow-up on a yearly basis.  The patient does not have symptoms concerning for COVID-19 infection (fever, chills, cough, or new shortness of breath).    Past Medical History:  Diagnosis Date  . A-fib ([place])   . Arthritis    "hands" (11/04/2015)  . Dry eyes   . Glaucoma   . Osteopenia   . Rosacea   . Wears glasses    Past Surgical History:  Procedure Laterality Date  . ATRIAL TACH ABLATION  11/04/2015  . COLONOSCOPY    . ELECTROPHYSIOLOGIC STUDY N/A 11/04/2015   Procedure: A-Tach Ablation;  Surgeon: Will [name], MD;  Location: [place] [facility];  Service: Cardiovascular;  Laterality: N/A;  . LEFT HEART CATHETERIZATION WITH CORONARY ANGIOGRAM N/A 03/12/2014   Procedure: LEFT HEART CATHETERIZATION WITH CORONARY ANGIOGRAM;  Surgeon: Pixie Casino, MD;  Location: [facility] CATH LAB;  Service: Cardiovascular;  Laterality: N/A;  . LOOP RECORDER IMPLANT N/A 09/15/2014   Procedure:  LOOP RECORDER IMPLANT;  Surgeon: Deboraha Sprang, MD;  Location: [facility] CATH LAB;  Service: Cardiovascular;  Laterality: N/A;  . LOOP RECORDER REMOVAL N/A 09/26/2017   Procedure: LOOP RECORDER REMOVAL;  Surgeon: Constance Haw, MD;  Location: [place] CV [facility];  Service: Cardiovascular;  Laterality: N/A;  . MASS EXCISION  Right 09/25/2014   Procedure: RIGHT RING FINGER EXCISION MASS AND DEBRIDEMENT DIP JOINT;  Surgeon: Leanora Cover, MD;  Location: [place];  Service: Orthopedics;  Laterality: Right;  . TONSILLECTOMY AND ADENOIDECTOMY  1979  . TUBAL LIGATION       Current Meds  Medication Sig  . acetaminophen (TYLENOL) 500 MG tablet Take 500 mg every 6 (six) hours as needed by mouth (pain).   Marland Kitchen ALPRAZolam (XANAX) 0.25 MG tablet TAKE 1 TABLET BY MOUTH AT BEDTIME AS NEEDED FOR ANXIETY  . amLODipine (NORVASC) 5 MG tablet TAKE 1 AND 1/2 TABLETS BY MOUTH EVERY DAY  . Calcium-Magnesium (CALCIUM MAGNESIUM 750) 300-300 MG TABS Take 1 tablet by mouth 2 (two) times daily.   . chlorthalidone (HYGROTON) 25 MG tablet Take 0.5 tablets (12.5 mg total) by mouth daily.  . hydroxypropyl methylcellulose (ISOPTO TEARS) 2.5 % ophthalmic solution Place 1 drop into both eyes as needed for dry eyes.  Marland Kitchen ibuprofen (ADVIL) 100 MG tablet Take by mouth.  Marland Kitchen KLOR-CON M20 20 MEQ tablet TAKE 1 TABLET (20 MEQ TOTAL) BY MOUTH DAILY AS NEEDED. PLEASE MAKE APPT FOR FUTURE REFILLS (Patient taking differently: Take 20 mEq by mouth as needed. )  . LECITHIN PO Take 1 capsule 2 (two) times daily by mouth.  . metroNIDAZOLE (METROCREAM) 0.75 % cream Apply topically.  . Omega-3 Fatty Acids (FISH OIL PO) Take 1 capsule by mouth 2 (two) times daily.   Marland Kitchen omeprazole (PRILOSEC) 40 MG capsule Take 40 mg by mouth as needed.      Allergies:   Prednisone, Dorzolamide hcl-timolol mal, Hydrocortisone, Mushroom extract complex, and Shellfish allergy   Social History   Tobacco Use  . Smoking status: Former Smoker    Packs/day: 0.50    Years: 25.00    Pack years: 12.50    Types: Cigarettes    Quit date: 11/22/2002    Years since quitting: 17.8  . Smokeless tobacco: Never Used  Vaping Use  . Vaping Use: Never used  Substance Use Topics  . Alcohol use: Not Currently  . Drug use: No     Family Hx: The patient's family history includes  Cancer in her maternal grandmother; Depression in her father; Valvular heart disease in her mother.  ROS:   Please see the history of present illness.     All other systems reviewed and are negative.   Prior CV studies:   The following studies were reviewed today:  Echo 02/25/2020 1. Left ventricular ejection fraction, by estimation, is 60 to 65%. The  left ventricle has normal function. The left ventricle has no regional  wall motion abnormalities. Left ventricular diastolic parameters are  consistent with Grade II diastolic  dysfunction (pseudonormalization).  2. Right ventricular systolic function is normal. The right ventricular  size is normal. There is normal pulmonary artery systolic pressure. The  estimated right ventricular systolic pressure is 84.1 mmHg.  3. Left atrial size was severely dilated.  4. Right atrial size was mildly dilated.  5. Borderline bileaflet mitral valve prolapse. The mitral valve is  abnormal. Trivial mitral valve regurgitation. No evidence of mitral  stenosis.  6. The aortic valve is tricuspid.  Aortic valve regurgitation is trivial.  No aortic stenosis is present.  7. There is borderline dilatation of the ascending aorta measuring 37 mm.  8. The inferior vena cava is normal in size with greater than 50%  respiratory variability, suggesting right atrial pressure of 3 mmHg.   Labs/Other Tests and Data Reviewed:    EKG:  An ECG dated 01/23/2020 was personally reviewed today and demonstrated:  Normal sinus rhythm without significant ST-T wave changes.  Recent Labs: 01/23/2020: Hemoglobin 12.6; Platelets 252   Recent Lipid Panel Lab Results  Component Value Date/Time   CHOL 172 12/19/2017 02:35 PM   TRIG 68 12/19/2017 02:35 PM   HDL 88 12/19/2017 02:35 PM   CHOLHDL 2.0 12/19/2017 02:35 PM   LDLCALC 70 12/19/2017 02:35 PM    Wt Readings from Last 3 Encounters:  09/07/20 120 lb (54.4 kg)  01/23/20 120 lb (54.4 kg)  09/04/19 124 lb (56.2  kg)     Risk Assessment/Calculations:     CHA2DS2-VASc Score = 3  This indicates a 3.2% annual risk of stroke. The patient's score is based upon: CHF History: 0 HTN History: 0 Diabetes History: 0 Stroke History: 0 Vascular Disease History: 0 Age Score: 2 Gender Score: 1     Objective:    Vital Signs:  BP 131/75   Pulse 68   Ht 5\' 4"  (1.626 m)   Wt 120 lb (54.4 kg)   BMI 20.60 kg/m    VITAL SIGNS:  reviewed  ASSESSMENT & PLAN:    1. PAF: No recent palpitation.  Continue on Eliquis, not on any rate control medication.   COVID-19 Education: The signs and symptoms of COVID-19 were discussed with the patient and how to seek care for testing (follow up with PCP or arrange E-visit).  The importance of social distancing was discussed today.  Time:   Today, I have spent 5 minutes with the patient with telehealth technology discussing the above problems.     Medication Adjustments/Labs and Tests Ordered: Current medicines are reviewed at length with the patient today.  Concerns regarding medicines are outlined above.   Tests Ordered: No orders of the defined types were placed in this encounter.   Medication Changes: No orders of the defined types were placed in this encounter.   Follow Up:  In Person in 1 year(s)  Signed, Almyra Deforest, [place]  09/09/2020 11:57 PM    [facility]

## 2020-09-07 NOTE — Telephone Encounter (Signed)
  Patient Consent for Virtual Visit         Kelsey James has provided verbal consent on 09/07/2020 for a virtual visit (video or telephone).   CONSENT FOR VIRTUAL VISIT FOR:  Kelsey James  By participating in this virtual visit I agree to the following:  I hereby voluntarily request, consent and authorize [facility] and its employed or contracted physicians, physician assistants, nurse practitioners or other licensed health care professionals (the Practitioner), to provide me with telemedicine health care services (the "Services") as deemed necessary by the treating Practitioner. I acknowledge and consent to receive the Services by the Practitioner via telemedicine. I understand that the telemedicine visit will involve communicating with the Practitioner through live audiovisual communication technology and the disclosure of certain medical information by electronic transmission. I acknowledge that I have been given the opportunity to request an in-person assessment or other available alternative prior to the telemedicine visit and am voluntarily participating in the telemedicine visit.  I understand that I have the right to withhold or withdraw my consent to the use of telemedicine in the course of my care at any time, without affecting my right to future care or treatment, and that the Practitioner or I may terminate the telemedicine visit at any time. I understand that I have the right to inspect all information obtained and/or recorded in the course of the telemedicine visit and may receive copies of available information for a reasonable fee.  I understand that some of the potential risks of receiving the Services via telemedicine include:  Marland Kitchen Delay or interruption in medical evaluation due to technological equipment failure or disruption; . Information transmitted may not be sufficient (e.g. poor resolution of images) to allow for appropriate medical decision making by the  Practitioner; and/or  . In rare instances, security protocols could fail, causing a breach of personal health information.  Furthermore, I acknowledge that it is my responsibility to provide information about my medical history, conditions and care that is complete and accurate to the best of my ability. I acknowledge that Practitioner's advice, recommendations, and/or decision may be based on factors not within their control, such as incomplete or inaccurate data provided by me or distortions of diagnostic images or specimens that may result from electronic transmissions. I understand that the practice of medicine is not an exact science and that Practitioner makes no warranties or guarantees regarding treatment outcomes. I acknowledge that a copy of this consent can be made available to me via my patient portal ([place]), or I can request a printed copy by calling the office of [facility].    I understand that my insurance will be billed for this visit.   I have read or had this consent read to me. . I understand the contents of this consent, which adequately explains the benefits and risks of the Services being provided via telemedicine.  . I have been provided ample opportunity to ask questions regarding this consent and the Services and have had my questions answered to my satisfaction. . I give my informed consent for the services to be provided through the use of telemedicine in my medical care

## 2020-11-05 ENCOUNTER — Other Ambulatory Visit: Payer: Self-pay | Admitting: Internal Medicine

## 2021-03-16 IMAGING — CT CT HEAD WITHOUT CONTRAST
3 of 7 series · 14 of 47 positions shown, 17 images · non-contrast
Comparison: Report of head CT 12/24/2013.

CLINICAL DATA: 78-year-old female with pain radiating from the head
to the shoulders. Possible syncope.

EXAM:
CT HEAD WITHOUT CONTRAST
CT CERVICAL SPINE WITHOUT CONTRAST
TECHNIQUE: Multidetector CT imaging of the head and cervical spine was
performed following the standard protocol without intravenous
contrast. Multiplanar CT image reconstructions of the cervical spine
were also generated.

[Series 5: coronal soft · coronal · 0.30mm/px · 3 of 66 slices shown]
[im 17/66  brain]
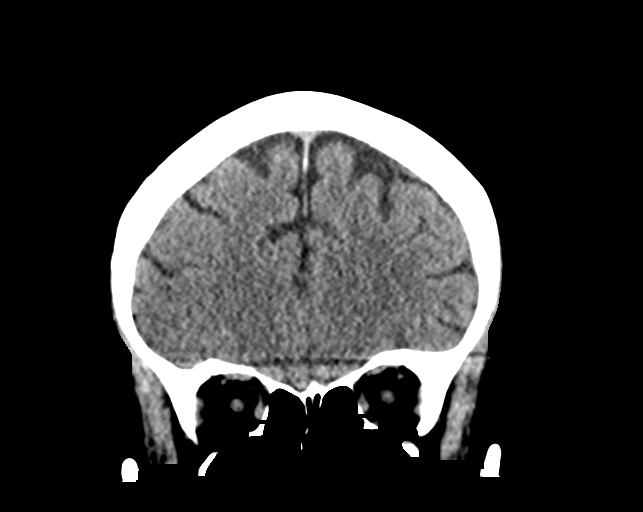
[im 33/66  brain]
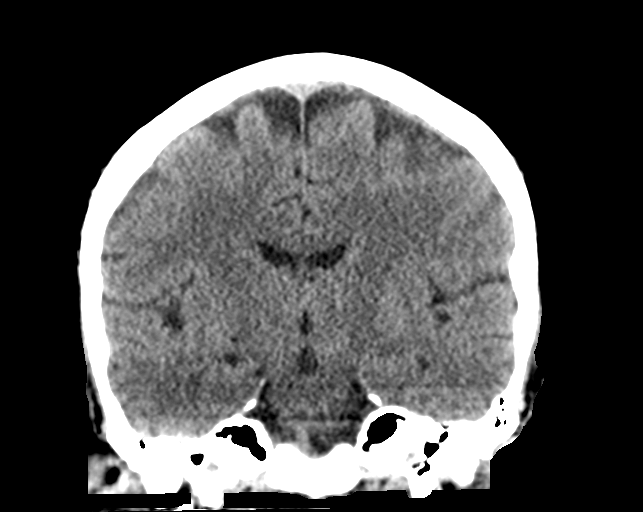
[im 49/66  brain]
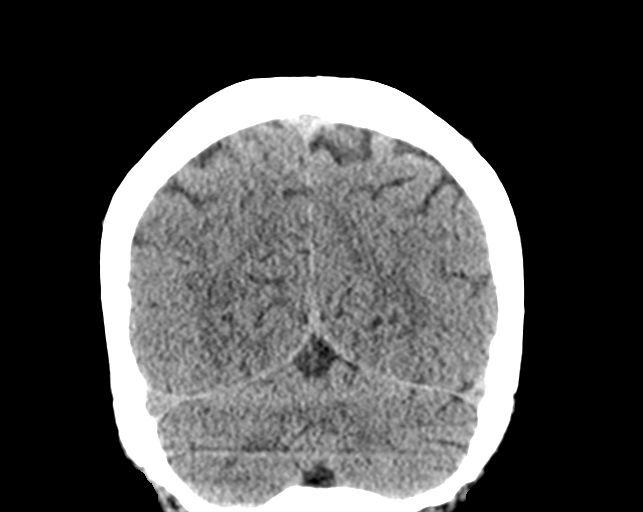

[Series 6: sagittal soft · sagittal · 0.30mm/px · 1 of 52 slices shown]
[im 26/52  brain]
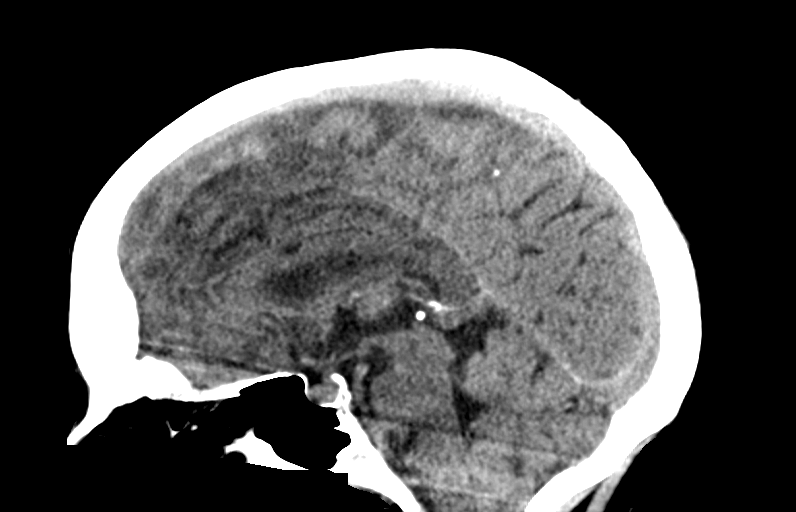

[Series 11: orthogonal axials · axial · 0.21mm/px · z∈[+1550,+1674]mm · 10 of 76 slices shown, 13 images]
[im 7/76  brain]
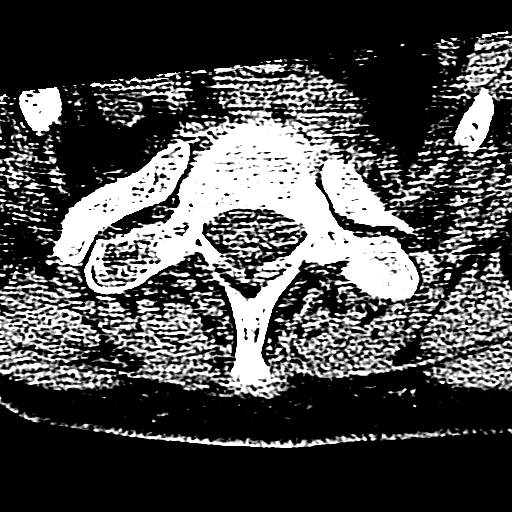
[im 7/76  bone]
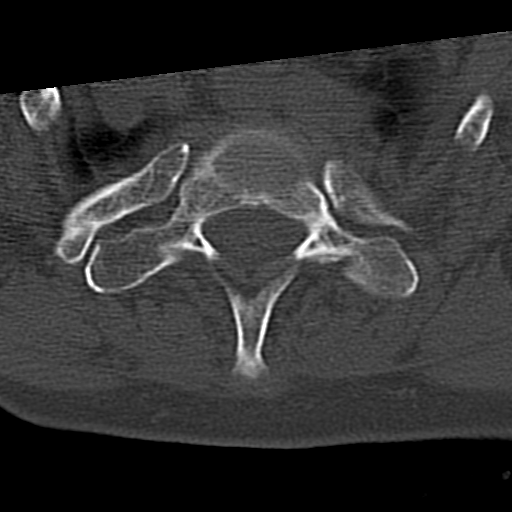
[im 13/76  brain]
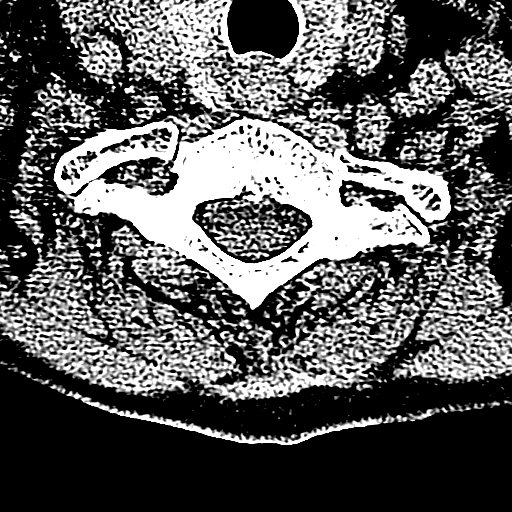
[im 19/76  brain]
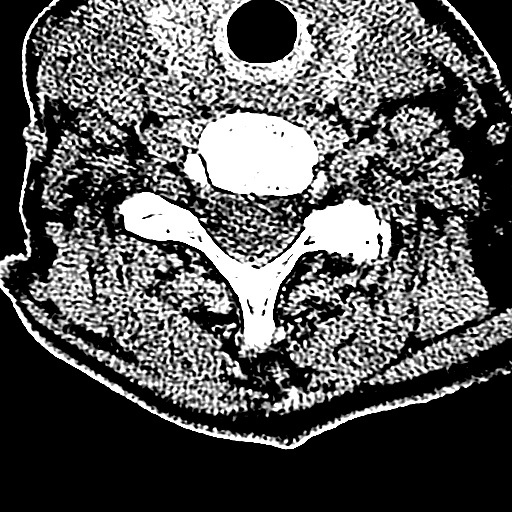
[im 26/76  brain]
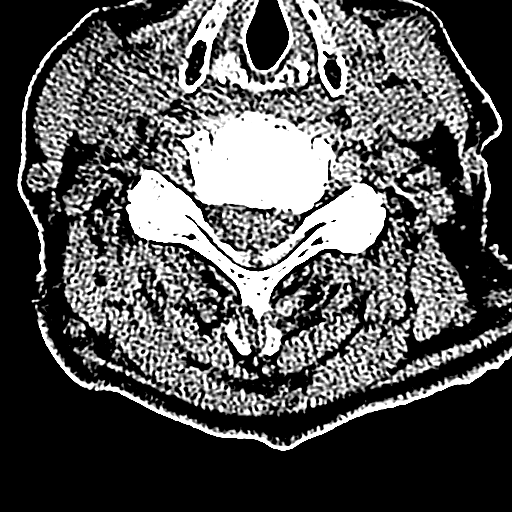
[im 32/76  brain]
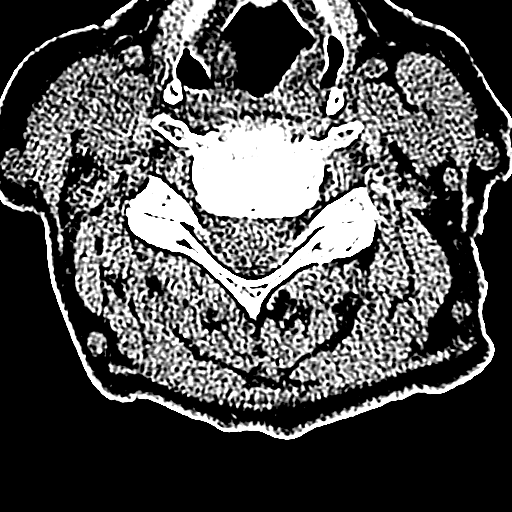
[im 32/76  bone]
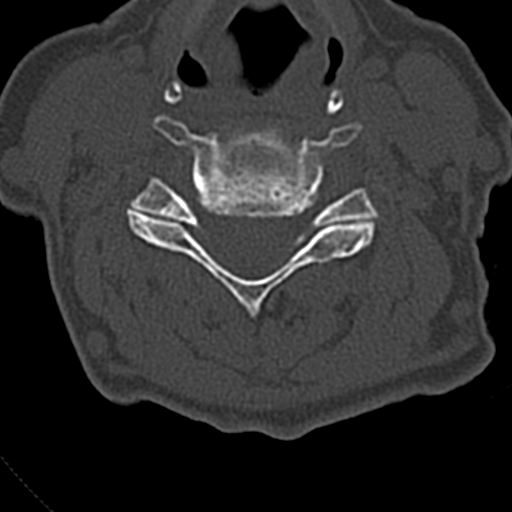
[im 44/76  brain]
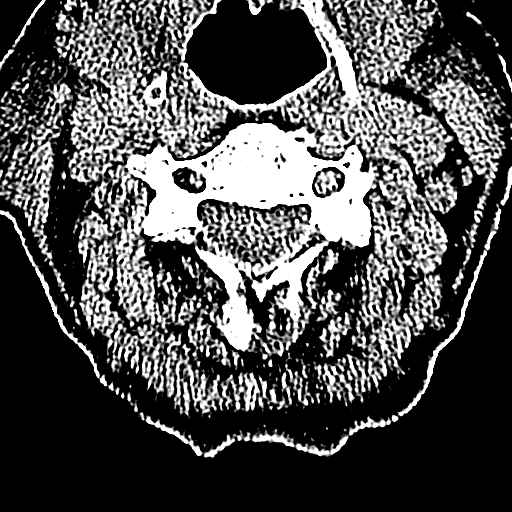
[im 51/76  brain]
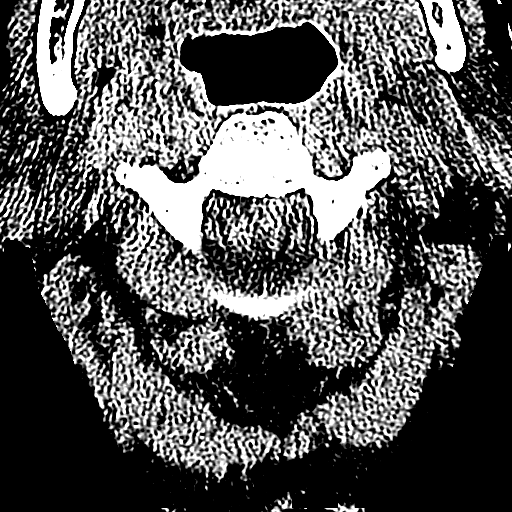
[im 57/76  brain]
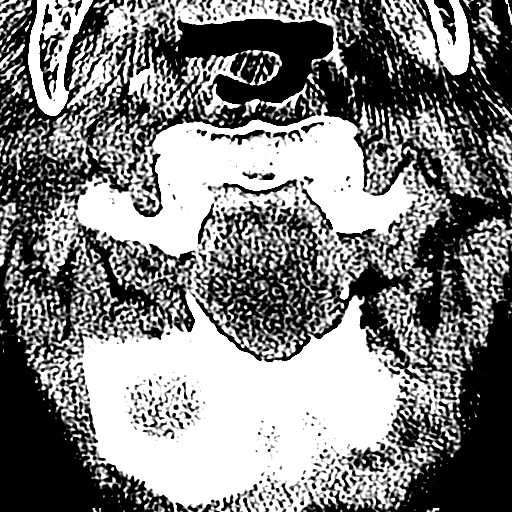
[im 63/76  brain]
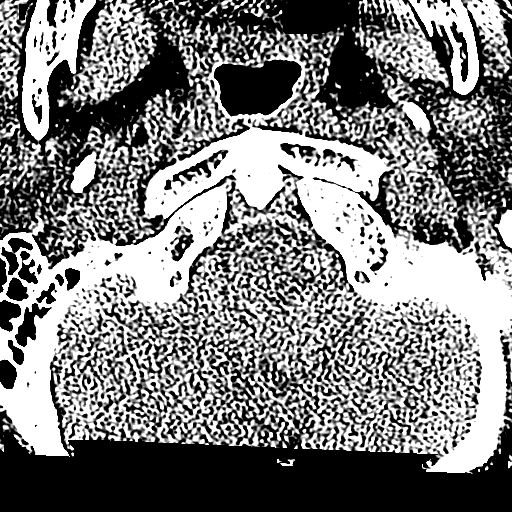
[im 63/76  bone]
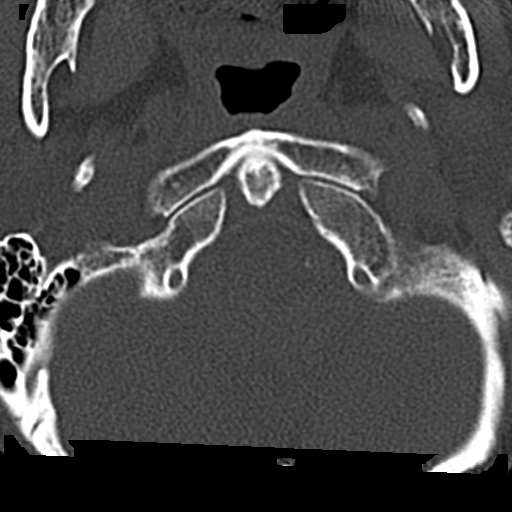
[im 69/76  brain]
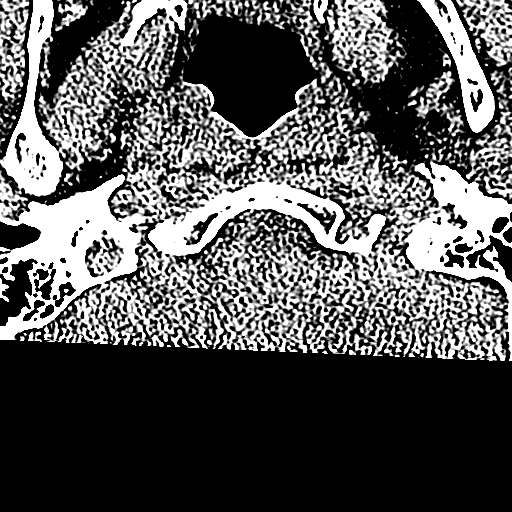

[14 of 47 positions shown; findings below may reference images not displayed]

FINDINGS: CT HEAD FINDINGS

Brain: Right occipital pole sulcal variation versus small arachnoid
cyst (series 3, image 15), is unchanged since 9754.

Cerebral volume is within normal limits for age. No midline shift,
ventriculomegaly, mass effect, intracranial hemorrhage or evidence
of cortically based acute infarction. Gray-white matter
differentiation is within normal limits throughout the brain. No
cortical encephalomalacia.

Vascular: Calcified atherosclerosis at the skull base. No suspicious
intracranial vascular hyperdensity.

Skull: Intact.

Sinuses/Orbits: Visualized paranasal sinuses and mastoids are clear.

Other: No acute orbit or scalp soft tissue findings.

CT CERVICAL SPINE FINDINGS

Alignment: Mild degenerative appearing anterolisthesis of C2 on C3.
Straightening of mid cervical lordosis. Mild degenerative appearing
retrolisthesis of C5 on C6. Cervicothoracic junction alignment is
within normal limits. Bilateral posterior element alignment is
within normal limits.

Skull base and vertebrae: Visualized skull base is intact. No
atlanto-occipital dissociation. No acute osseous abnormality
identified.

Soft tissues and spinal canal: No prevertebral fluid or swelling. No
visible canal hematoma. Negative noncontrast neck soft tissues.

Disc levels: Left vertebral artery tortuosity resulting in smooth
scalloping of bone along the left transverse foramen (e.g. Series 8,
image 40 at the C4 level).

Chronic disc and endplate degeneration throughout much of the
cervical spine. Up to mild associated degenerative spinal stenosis.

Upper chest: There are bilateral C7 cervical ribs. Negative visible
upper thoracic levels. Negative lung apices.
IMPRESSION: 1. Stable and negative non contrast CT appearance of the brain; a
possible small right occipital arachnoid cyst is unchanged since
2. No acute acute traumatic injury identified in the cervical spine.
3. Tortuous left vertebral artery.  Bilateral C7 cervical ribs.

## 2021-04-19 ENCOUNTER — Ambulatory Visit (INDEPENDENT_AMBULATORY_CARE_PROVIDER_SITE_OTHER): Payer: Medicare Other

## 2021-04-19 ENCOUNTER — Ambulatory Visit
Admission: EM | Admit: 2021-04-19 | Discharge: 2021-04-19 | Disposition: A | Discharge status: Home / Self Care | Payer: Medicare Other | Attending: Family Medicine | Admitting: Family Medicine

## 2021-04-19 ENCOUNTER — Emergency Department (HOSPITAL_COMMUNITY)
Admission: EM | Admit: 2021-04-19 | Discharge: 2021-04-19 | Disposition: A | Discharge status: Home / Self Care | Payer: Medicare Other

## 2021-04-19 ENCOUNTER — Other Ambulatory Visit: Payer: Self-pay

## 2021-04-19 DIAGNOSIS — R0600 Dyspnea, unspecified: Secondary | ICD-10-CM | POA: Diagnosis not present

## 2021-04-19 DIAGNOSIS — R079 Chest pain, unspecified: Secondary | ICD-10-CM

## 2021-04-19 DIAGNOSIS — R002 Palpitations: Secondary | ICD-10-CM

## 2021-04-19 DIAGNOSIS — J069 Acute upper respiratory infection, unspecified: Secondary | ICD-10-CM | POA: Diagnosis not present

## 2021-04-19 MED ORDER — AMOXICILLIN-POT CLAVULANATE 875-125 MG PO TABS: 1.0000 | ORAL_TABLET | Freq: Two times a day (BID) | ORAL | 0 refills | Status: AC

## 2021-04-19 MED ORDER — GUAIFENESIN-CODEINE 100-10 MG/5ML PO SYRP: 5.0000 mL | ORAL_SOLUTION | Freq: Three times a day (TID) | ORAL | 0 refills | Status: DC | PRN

## 2021-04-19 MED ORDER — AZITHROMYCIN 250 MG PO TABS: 250.0000 mg | ORAL_TABLET | Freq: Every day | ORAL | 0 refills | Status: DC

## 2021-04-19 NOTE — Discharge Instructions (Addendum)
I have sent in Augmentin for you to take twice a day for 7 days.  I have sent in azithromycin for you to take. Take 2 tablets today, then one tablet daily for the next 4 days.  I have sent in cough syrup for you to take. This medication can make you sleepy. Do not drive while taking this medication.  I think that the hospital would be the best place for you to be for further workup

## 2021-04-26 NOTE — ED Provider Notes (Signed)
[facility]   [id number] 04/19/21 Arrival Time: 1925   SUBJECTIVE:  Kelsey James is a 80 y.o. female who presents with complaint of persistent cough, SOB, chest pain with coughing and deep breathing for the last 2 weeks. Has recently been treated with antibiotics from PCP for this with little relief. Denies previous hx Covid. Has completed Covid vaccines. Has completed flu vaccine.  Overall fatigued. SOB: mild. Wheezing: mild. Reports that she went to the ER for evaluation but left due to wait times. Social History   Tobacco Use  Smoking Status Former   Packs/day: 0.50   Years: 25.00   Pack years: 12.50   Types: Cigarettes   Quit date: 11/22/2002   Years since quitting: 18.4  Smokeless Tobacco Never   ROS: As per HPI.  OBJECTIVE:  There were no vitals filed for this visit.   General appearance: alert; no distress HEENT: nasal congestion; clear runny nose; throat irritation secondary to post-nasal drainage Neck: supple without LAD Lungs: wheezing to bilateral lower lobes  Skin: warm and dry Psychological: alert and cooperative; normal mood and affect  Results for orders placed or performed in visit on 01/23/20  CBC  Result Value Ref Range   WBC 5.7 3.4 - 10.8 x10E3/uL   RBC 4.19 3.77 - 5.28 x10E6/uL   Hemoglobin 12.6 11.1 - 15.9 g/dL   Hematocrit 37.1 34.0 - 46.6 %   MCV 89 79 - 97 fL   MCH 30.1 26.6 - 33.0 pg   MCHC 34.0 31.5 - 35.7 g/dL   RDW 14.4 11.7 - 15.4 %   Platelets 252 150 - 450 x10E3/uL    Labs Reviewed - No data to display  DG Chest 2 View  Result Date: 04/19/2021 CLINICAL DATA:  Chest pain and dyspnea, weakness, palpitations EXAM: CHEST - 2 VIEW COMPARISON:  05/31/2019 chest radiograph. FINDINGS: Stable cardiomediastinal silhouette with normal heart size. No pneumothorax. No pleural effusion. Hyperinflated lungs. No pulmonary edema. No acute consolidative airspace disease. IMPRESSION: 1. No acute cardiopulmonary disease. 2. Hyperinflated  lungs, suggesting obstructive lung disease. Electronically Signed   By: Ilona Sorrel M.D.   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   Dorzolamide Hcl-Timolol Mal Other (See Comments)    Also made dry eyes worse   Hydrocortisone Other (See Comments)    Steroid induced glaucoma   Mushroom Extract Complex Nausea And Vomiting   Shellfish Allergy Nausea And Vomiting    Past Medical History:  Diagnosis Date   A-fib ([place])    Arthritis    "hands" (11/04/2015)   Dry eyes    Glaucoma    Osteopenia    Rosacea    Wears glasses    Social History   Socioeconomic History   Marital status: Married    Spouse name: Not on file   Number of children: Not on file   Years of education: Not on file   Highest education level: Not on file  Occupational History   Not on file  Tobacco Use   Smoking status: Former    Packs/day: 0.50    Years: 25.00    Pack years: 12.50    Types: Cigarettes    Quit date: 11/22/2002    Years since quitting: 18.4   Smokeless tobacco: Never  Vaping Use   Vaping Use: Never used  Substance and Sexual Activity   Alcohol use:  Not Currently   Drug use: No   Sexual activity: Not Currently  Other Topics Concern   Not on file  Social History Narrative   Not on file   Social Determinants of Health   Financial Resource Strain: Not on file  Food Insecurity: Not on file  Transportation Needs: Not on file  Physical Activity: Not on file  Stress: Not on file  Social Connections: Not on file  Intimate Partner Violence: Not on file   Family History  Problem Relation Age of Onset   Valvular heart disease Mother    Depression Father    Cancer Maternal Grandmother      ASSESSMENT & PLAN:  1. Upper respiratory tract infection, unspecified type     Meds ordered this encounter  Medications   amoxicillin-clavulanate (AUGMENTIN) 875-125 MG tablet     Sig: Take 1 tablet by mouth 2 (two) times daily for 7 days.    Dispense:  14 tablet    Refill:  0    Order Specific Question:   Supervising Provider    Answer:   Chase Picket [[id number]]   azithromycin (ZITHROMAX) 250 MG tablet    Sig: Take 1 tablet (250 mg total) by mouth daily. Take first 2 tablets together, then 1 every day until finished.    Dispense:  6 tablet    Refill:  0    Order Specific Question:   Supervising Provider    Answer:   Chase Picket [[id number]]   guaiFENesin-codeine (ROBITUSSIN AC) 100-10 MG/5ML syrup    Sig: Take 5 mLs by mouth 3 (three) times daily as needed for cough.    Dispense:  120 mL    Refill:  0    Order Specific Question:   Supervising Provider    Answer:   Chase Picket [[id number]]   Xray negative for pneumonia or other abnormality Prescribed Augmentin 875 mg BID x 7 days Prescribed azithromycin to cover for developing pneumonia that may not be visualized on xray yet Prescribed cheratussin Sedation precautions given Discussed that given the severity of her cough and pain with coughing, that she would be best served in the ER Declines at this time Will treat with above therapy given duration of symptoms.   OTC symptom care as needed. Will plan f/u with PCP or here as needed.  Reviewed expectations re: course of current medical issues. Questions answered. Outlined signs and symptoms indicating need for more acute intervention. Patient verbalized understanding. After Visit Summary given.            Faustino Congress, NP 04/26/21 1813

## 2021-09-01 ENCOUNTER — Institutional Professional Consult (permissible substitution): Payer: Medicare Other | Admitting: Internal Medicine

## 2021-09-01 ENCOUNTER — Institutional Professional Consult (permissible substitution): Payer: Medicare Other | Admitting: Pulmonary Disease

## 2021-10-14 ENCOUNTER — Other Ambulatory Visit: Payer: Self-pay | Admitting: Internal Medicine

## 2021-11-16 NOTE — Progress Notes (Signed)
PCP:  Nickola Major, MD Primary Cardiologist: Pixie Casino, MD Electrophysiologist: Constance Haw, MD   Kelsey James is a 81 y.o. female seen today for Will [name], MD for routine electrophysiology followup.  Since last being seen in our clinic the patient reports doing well overall. No significant change in symptoms since last visit or monitoring. Continues to have very brief palpitations almost daily (previously associated with NSR on monitoring). She describes shortness of breath, but clarifies it is a "catch" in her breath that can occur at any time, and lasts only for a single breath. she denies exertional chest pain,  undue dyspnea, PND, orthopnea, nausea, vomiting, dizziness, syncope, edema, weight gain, or early satiety.  Device information MDT ILR, implanted 09/15/2014 for palpitations  >>> removed 09/26/2017   Atrial arrhythmia Hx 2016 >> Atach > ablated by Dr. Curt Bears (at 1:00 on the tricuspid valve) 2017 >> AFib   AAD Hx Remotely on Flecainide >> stopped made her feel "Terrible" Intolerant of coreg made her feel "Very bad" and did not want beta blockers  Past Medical History:  Diagnosis Date   A-fib ([place])    Arthritis    "hands" (11/04/2015)   Dry eyes    Glaucoma    Osteopenia    Rosacea    Wears glasses    Past Surgical History:  Procedure Laterality Date   ATRIAL TACH ABLATION  11/04/2015   COLONOSCOPY     ELECTROPHYSIOLOGIC STUDY N/A 11/04/2015   Procedure: A-Tach Ablation;  Surgeon: Will [name], MD;  Location: [facility];  Service: Cardiovascular;  Laterality: N/A;   LEFT HEART CATHETERIZATION WITH CORONARY ANGIOGRAM N/A 03/12/2014   Procedure: LEFT HEART CATHETERIZATION WITH CORONARY ANGIOGRAM;  Surgeon: Pixie Casino, MD;  Location: [facility] CATH LAB;  Service: Cardiovascular;  Laterality: N/A;   LOOP RECORDER IMPLANT N/A 09/15/2014   Procedure: LOOP RECORDER IMPLANT;  Surgeon: Deboraha Sprang, MD;  Location: [facility] CATH LAB;   Service: Cardiovascular;  Laterality: N/A;   LOOP RECORDER REMOVAL N/A 09/26/2017   Procedure: LOOP RECORDER REMOVAL;  Surgeon: Constance Haw, MD;  Location: [facility];  Service: Cardiovascular;  Laterality: N/A;   MASS EXCISION Right 09/25/2014   Procedure: RIGHT RING FINGER EXCISION MASS AND DEBRIDEMENT DIP JOINT;  Surgeon: Leanora Cover, MD;  Location: [facility];  Service: Orthopedics;  Laterality: Right;   TONSILLECTOMY AND ADENOIDECTOMY  1979   TUBAL LIGATION      Current Outpatient Medications  Medication Sig Dispense Refill   acetaminophen (TYLENOL) 500 MG tablet Take 500 mg every 6 (six) hours as needed by mouth (pain).      ALPRAZolam (XANAX) 0.25 MG tablet TAKE 1 TABLET BY MOUTH AT BEDTIME AS NEEDED FOR ANXIETY 30 tablet 1   amLODipine (NORVASC) 5 MG tablet TAKE 1.5 TABLETS BY MOUTH EVERY DAY 135 tablet 2   azithromycin (ZITHROMAX) 250 MG tablet Take 1 tablet (250 mg total) by mouth daily. Take first 2 tablets together, then 1 every day until finished. 6 tablet 0   Calcium-Magnesium (CALCIUM MAGNESIUM 750) 300-300 MG TABS Take 1 tablet by mouth 2 (two) times daily.      chlorthalidone (HYGROTON) 25 MG tablet TAKE 1/2 TABLET BY MOUTH EVERY DAY 45 tablet 3   guaiFENesin-codeine (ROBITUSSIN AC) 100-10 MG/5ML syrup Take 5 mLs by mouth 3 (three) times daily as needed for cough. 120 mL 0   hydroxypropyl methylcellulose (ISOPTO TEARS) 2.5 % ophthalmic solution Place 1 drop  into both eyes as needed for dry eyes.     ibuprofen (ADVIL) 100 MG tablet Take by mouth.     KLOR-CON M20 20 MEQ tablet TAKE 1 TABLET (20 MEQ TOTAL) BY MOUTH DAILY AS NEEDED. PLEASE MAKE APPT FOR FUTURE REFILLS 90 tablet 1   LECITHIN PO Take 1 capsule 2 (two) times daily by mouth.     metroNIDAZOLE (METROCREAM) 0.75 % cream Apply topically.     Omega-3 Fatty Acids (FISH OIL PO) Take 1 capsule by mouth 2 (two) times daily.      omeprazole (PRILOSEC) 40 MG capsule Take 40 mg by mouth as  needed.      No current facility-administered medications for this visit.    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   Dorzolamide Hcl-Timolol Mal Other (See Comments)    Also made dry eyes worse   Hydrocortisone Other (See Comments)    Steroid induced glaucoma   Mushroom Extract Complex Nausea And Vomiting   Shellfish Allergy Nausea And Vomiting    Social History   Socioeconomic History   Marital status: Married    Spouse name: Not on file   Number of children: Not on file   Years of education: Not on file   Highest education level: Not on file  Occupational History   Not on file  Tobacco Use   Smoking status: Former    Packs/day: 0.50    Years: 25.00    Pack years: 12.50    Types: Cigarettes    Quit date: 11/22/2002    Years since quitting: 19.0   Smokeless tobacco: Never  Vaping Use   Vaping Use: Never used  Substance and Sexual Activity   Alcohol use: Not Currently   Drug use: No   Sexual activity: Not Currently  Other Topics Concern   Not on file  Social History Narrative   Not on file   Social Determinants of Health   Financial Resource Strain: Not on file  Food Insecurity: Not on file  Transportation Needs: Not on file  Physical Activity: Not on file  Stress: Not on file  Social Connections: Not on file  Intimate Partner Violence: Not on file     Review of Systems: All other systems reviewed and are otherwise negative except as noted above.  Physical Exam: Vitals:   11/17/21 1013  BP: 110/60  Pulse: 67  SpO2: 99%  Weight: 118 lb (53.5 kg)  Height: 5\' 4"  (1.626 m)    GEN- The patient is well appearing, alert and oriented x 3 today.   HEENT: normocephalic, atraumatic; sclera clear, conjunctiva pink; hearing intact; oropharynx clear; neck supple, no JVP Lymph- no cervical lymphadenopathy Lungs- Clear to ausculation bilaterally, normal work of breathing.  No  wheezes, rales, rhonchi Heart- Regular rate and rhythm, no murmurs, rubs or gallops, PMI not laterally displaced GI- soft, non-tender, non-distended, bowel sounds present, no hepatosplenomegaly Extremities- no clubbing, cyanosis, or edema; DP/PT/radial pulses 2+ bilaterally MS- no significant deformity or atrophy Skin- warm and dry, no rash or lesion Psych- euthymic mood, full affect Neuro- strength and sensation are intact  EKG is ordered. Personal review of EKG from today shows NSR at 67 bpm  Additional studies reviewed include: Previous EP office notes.   Assessment and Plan:  1. Arial tachycardia Stable  S/p EPS ablation    2. Paroxysmal Afib     CHA2DS2Vasc is 3, on  Eliquis appropriately dosed Monitor 03/2020 stable. NSR with symptoms.  She has brief, fleeting palpitations, one second, previously noted to be several beats of SVT on monitoring. She would like to try diltiazem to see if this helps.  Labs today.    3. HTN Stable on current regimen    4. SOB Multifactorial  Volume status stable on exam. Echo 02/2020 with normal EF.   Follow up with Dr. Curt Bears in 6 months   Shirley Friar, PA-C  11/17/21 10:24 AM

## 2021-11-17 ENCOUNTER — Encounter: Payer: Self-pay | Admitting: Student

## 2021-11-17 ENCOUNTER — Other Ambulatory Visit: Payer: Self-pay

## 2021-11-17 ENCOUNTER — Ambulatory Visit (INDEPENDENT_AMBULATORY_CARE_PROVIDER_SITE_OTHER): Payer: Medicare Other | Admitting: Student

## 2021-11-17 VITALS — BP 110/60 | HR 67 | Ht 64.0 in | Wt 118.0 lb

## 2021-11-17 DIAGNOSIS — I480 Paroxysmal atrial fibrillation: Secondary | ICD-10-CM

## 2021-11-17 DIAGNOSIS — I10 Essential (primary) hypertension: Secondary | ICD-10-CM

## 2021-11-17 DIAGNOSIS — R0602 Shortness of breath: Secondary | ICD-10-CM | POA: Diagnosis not present

## 2021-11-17 MED ORDER — DILTIAZEM HCL ER COATED BEADS 120 MG PO CP24: 120.0000 mg | ORAL_CAPSULE | Freq: Every day | ORAL | 3 refills | Status: DC

## 2021-11-17 MED ORDER — POTASSIUM CHLORIDE CRYS ER 20 MEQ PO TBCR: EXTENDED_RELEASE_TABLET | ORAL | 3 refills | Status: DC

## 2021-11-17 NOTE — Patient Instructions (Signed)
Medication Instructions:  Your physician has recommended you make the following change in your medication:   START: Diltiazem 120mg  daily in the evening.   *If you need a refill on your cardiac medications before your next appointment, please call your pharmacy*   Lab Work: TODAY: BMET, TSH  If you have labs (blood work) drawn today and your tests are completely normal, you will receive your results only by: [place] (if you have MyChart) OR A paper copy in the mail If you have any lab test that is abnormal or we need to change your treatment, we will call you to review the results.   Follow-Up: At [facility], you and your health needs are our priority.  As part of our continuing mission to provide you with exceptional heart care, we have created designated Provider Care Teams.  These Care Teams include your primary Cardiologist (physician) and Advanced Practice Providers (APPs -  Physician Assistants and Nurse Practitioners) who all work together to provide you with the care you need, when you need it.  We recommend signing up for the patient portal called "MyChart".  Sign up information is provided on this After Visit Summary.  MyChart is used to connect with patients for Virtual Visits (Telemedicine).  Patients are able to view lab/test results, encounter notes, upcoming appointments, etc.  Non-urgent messages can be sent to your provider as well.   To learn more about what you can do with MyChart, go to [email or web address].    Your next appointment:   6 month(s)  The format for your next appointment:   In Person  Provider:   You may see Allegra Lai, MD or one of the following Advanced Practice Providers on your designated Care Team:   Tommye Standard, [place] "[facility]" [place], [place]

## 2021-11-18 LAB — BASIC METABOLIC PANEL
BUN/Creatinine Ratio: 14 (ref 12–28)
BUN: 8 mg/dL (ref 8–27)
CO2: 27 mmol/L (ref 20–29)
Calcium: 9.7 mg/dL (ref 8.7–10.3)
Chloride: 99 mmol/L (ref 96–106)
Creatinine, Ser: 0.57 mg/dL (ref 0.57–1.00)
Glucose: 88 mg/dL (ref 70–99)
Potassium: 3.7 mmol/L (ref 3.5–5.2)
Sodium: 139 mmol/L (ref 134–144)
eGFR: 93 mL/min/{1.73_m2} (ref >59)

## 2021-11-18 LAB — TSH: TSH: 1.15 u[IU]/mL (ref 0.450–4.500)

## 2021-11-19 ENCOUNTER — Other Ambulatory Visit: Payer: Self-pay

## 2021-11-19 DIAGNOSIS — I480 Paroxysmal atrial fibrillation: Secondary | ICD-10-CM

## 2021-11-19 MED ORDER — POTASSIUM CHLORIDE CRYS ER 20 MEQ PO TBCR: 20.0000 meq | EXTENDED_RELEASE_TABLET | Freq: Every day | ORAL | 3 refills | Status: DC

## 2021-12-07 ENCOUNTER — Other Ambulatory Visit: Payer: Medicare Other

## 2021-12-09 ENCOUNTER — Other Ambulatory Visit: Payer: Self-pay

## 2021-12-09 ENCOUNTER — Other Ambulatory Visit: Payer: Medicare Other

## 2021-12-09 DIAGNOSIS — I480 Paroxysmal atrial fibrillation: Secondary | ICD-10-CM

## 2021-12-10 ENCOUNTER — Other Ambulatory Visit: Payer: Self-pay

## 2021-12-10 DIAGNOSIS — I480 Paroxysmal atrial fibrillation: Secondary | ICD-10-CM

## 2021-12-10 LAB — BASIC METABOLIC PANEL
BUN/Creatinine Ratio: 20 (ref 12–28)
BUN: 11 mg/dL (ref 8–27)
CO2: 27 mmol/L (ref 20–29)
Calcium: 9.7 mg/dL (ref 8.7–10.3)
Chloride: 96 mmol/L (ref 96–106)
Creatinine, Ser: 0.56 mg/dL — ABNORMAL LOW (ref 0.57–1.00)
Glucose: 90 mg/dL (ref 70–99)
Potassium: 3.5 mmol/L (ref 3.5–5.2)
Sodium: 138 mmol/L (ref 134–144)
eGFR: 93 mL/min/{1.73_m2} (ref >59)

## 2021-12-10 MED ORDER — POTASSIUM CHLORIDE CRYS ER 20 MEQ PO TBCR: 40.0000 meq | EXTENDED_RELEASE_TABLET | Freq: Every day | ORAL | 3 refills | Status: DC

## 2021-12-24 ENCOUNTER — Other Ambulatory Visit: Payer: Medicare Other | Admitting: *Deleted

## 2021-12-24 ENCOUNTER — Other Ambulatory Visit: Payer: Self-pay

## 2021-12-24 DIAGNOSIS — I480 Paroxysmal atrial fibrillation: Secondary | ICD-10-CM

## 2021-12-25 LAB — BASIC METABOLIC PANEL
BUN/Creatinine Ratio: 22 (ref 12–28)
BUN: 11 mg/dL (ref 8–27)
CO2: 26 mmol/L (ref 20–29)
Calcium: 9.2 mg/dL (ref 8.7–10.3)
Chloride: 97 mmol/L (ref 96–106)
Creatinine, Ser: 0.5 mg/dL — ABNORMAL LOW (ref 0.57–1.00)
Glucose: 90 mg/dL (ref 70–99)
Potassium: 4.2 mmol/L (ref 3.5–5.2)
Sodium: 137 mmol/L (ref 134–144)
eGFR: 95 mL/min/{1.73_m2} (ref >59)

## 2022-01-04 ENCOUNTER — Encounter: Payer: Self-pay | Admitting: Internal Medicine

## 2022-01-04 ENCOUNTER — Other Ambulatory Visit: Payer: Self-pay

## 2022-01-04 ENCOUNTER — Telehealth: Payer: Self-pay | Admitting: Student

## 2022-01-04 MED ORDER — METOPROLOL SUCCINATE ER 25 MG PO TB24: 25.0000 mg | ORAL_TABLET | Freq: Every day | ORAL | 3 refills | Status: DC

## 2022-01-04 NOTE — Telephone Encounter (Signed)
LM for pt to return my call

## 2022-01-04 NOTE — Telephone Encounter (Signed)
Pt c/o medication issue:  1. Name of Medication: diltiazem (CARDIZEM CD) 120 MG 24 hr capsule  2. How are you currently taking this medication (dosage and times per day)? Has not taken in 3 days  3. Are you having a reaction (difficulty breathing--STAT)? no  4. What is your medication issue? Patient states since starting the medication she has had swelling in her ankles, calves, and feet. She says she cannot get her shoes on. She says she stopped taking the medication for 3 days and thinks it has help. She is also calling for her lab results.

## 2022-01-04 NOTE — Telephone Encounter (Signed)
error 

## 2022-01-04 NOTE — Telephone Encounter (Signed)
Pt agrees to try the Metoprolol. She stated that she also takes Amlodipine and is wondering if that could also be causing the swelling but since it's better with stopping the Diltiazem she will try the Metoprolol first before making other medication changes.

## 2022-01-04 NOTE — Telephone Encounter (Signed)
I called and spoke with pt. She said the Diltiazem was helping tremendously but she started swelling bad. The last dose she took was Sunday night and her swelling is completely gone away. She wants to know if there is anything else she can try.   Also, Her K was 3.5 2 weeks ago, we increased her to 28meq daily and rechecked after 2 weeks. It went up to 4.2. Do you want her to continue taking the 32meq daily?

## 2022-04-03 ENCOUNTER — Other Ambulatory Visit: Payer: Self-pay | Admitting: Student

## 2022-09-30 ENCOUNTER — Other Ambulatory Visit: Payer: Self-pay | Admitting: Internal Medicine

## 2022-10-12 ENCOUNTER — Ambulatory Visit: Payer: Medicare Other | Admitting: Neurology

## 2022-11-23 ENCOUNTER — Telehealth: Payer: Self-pay | Admitting: Neurology

## 2022-11-23 ENCOUNTER — Encounter: Payer: Self-pay | Admitting: Neurology

## 2022-11-23 ENCOUNTER — Ambulatory Visit (INDEPENDENT_AMBULATORY_CARE_PROVIDER_SITE_OTHER): Payer: Medicare Other | Admitting: Neurology

## 2022-11-23 VITALS — BP 118/66 | HR 59 | Ht 63.0 in | Wt 114.5 lb

## 2022-11-23 DIAGNOSIS — M5416 Radiculopathy, lumbar region: Secondary | ICD-10-CM | POA: Diagnosis not present

## 2022-11-23 DIAGNOSIS — R208 Other disturbances of skin sensation: Secondary | ICD-10-CM

## 2022-11-23 DIAGNOSIS — G629 Polyneuropathy, unspecified: Secondary | ICD-10-CM | POA: Diagnosis not present

## 2022-11-23 DIAGNOSIS — R682 Dry mouth, unspecified: Secondary | ICD-10-CM | POA: Diagnosis not present

## 2022-11-23 DIAGNOSIS — M5440 Lumbago with sciatica, unspecified side: Secondary | ICD-10-CM

## 2022-11-23 DIAGNOSIS — G8929 Other chronic pain: Secondary | ICD-10-CM

## 2022-11-23 NOTE — Patient Instructions (Signed)
Alpha Lipoic Acid 600 mg twice a day

## 2022-11-23 NOTE — Telephone Encounter (Signed)
medicare/[employer] sup NPR sent to GI [phone] 

## 2022-11-23 NOTE — Progress Notes (Signed)
[facility]  PATIENT: Kelsey James DOB: 1941/07/22  REFERRING DOCTOR OR PCP: Terrill Mohr MD SOURCE: Patient, notes from primary care  _________________________________   HISTORICAL  CHIEF COMPLAINT:  Chief Complaint  Patient presents with   Room 10    Pt is here Alone. Pt states that she has burning and tingling in both feet. Pt states that the burning and tingling.     HISTORY OF PRESENT ILLNESS:  I had the pleasure of seeing your patient, Kelsey James, at [facility] for neurologic consultation regarding her foot dysesthesias.  She is a 82 year old woman who injured the left leg about 3 years ago - fell off a large slick rock with large shin bruise that took months to improve.  .  Shortly after that, she noted a burning sensation in her left leg and foot .   Over time she started to have similar symptoms and now the dysesthesias are fairly symmetric.    She notes a knot in her left foot between the toes and if that is compressed, pain is worse.  Pain is constant with fluctuations.   Tight shoes worsen the pain and heat helps.    She also has lower back pain and had an MRI many years ago showing spinal stenosis and other DJD.   She has frequent aching from the mid back to buttocks.   This pain has gradually worsened.     Low back pain improves with movements and is worse with prolonged sitting.     She does not have claudication symptoms.      She feels gait is mildly unsteady and legs slightly weak but also notes bilateral knee pain.   She was prescribed a medication, maybe gabapentin but took only one dose as she prefers not to go on a medication.     A foot vibrator did not help.  She has AFib and used to be on Eliquis.   She reports heart flips back and forth but sh stopped due to multiple bruises.   She has dry eyes and dry mouth.     REVIEW OF SYSTEMS: Constitutional: No fevers, chills, sweats, or change in appetite Eyes: No  visual changes, double vision, eye pain Ear, nose and throat: No hearing loss, ear pain, nasal congestion, sore throat Cardiovascular: No chest pain, palpitations Respiratory:  No shortness of breath at rest or with exertion.   No wheezes GastrointestinaI: No nausea, vomiting, diarrhea, abdominal pain, fecal incontinence Genitourinary:  No dysuria, urinary retention or frequency.  No nocturia. Musculoskeletal:  No neck pain, back pain Integumentary: No rash, pruritus, skin lesions Neurological: as above Psychiatric: No depression at this time.  No anxiety Endocrine: No palpitations, diaphoresis, change in appetite, change in weigh or increased thirst Hematologic/Lymphatic:  No anemia, purpura, petechiae. 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 , Disp: , Rfl:    ALPRAZolam (XANAX) 0.25 MG tablet,  TAKE 1 TABLET BY MOUTH AT BEDTIME AS NEEDED FOR ANXIETY, Disp: 30 tablet, Rfl: 1   amLODipine (NORVASC) 5 MG tablet, TAKE 1.5 TABLETS BY MOUTH EVERY DAY, Disp: 135 tablet, Rfl: 2   Calcium-Magnesium 300-300 MG TABS, Take 1 tablet by mouth 2 (two) times daily. , Disp: , Rfl:    chlorthalidone (HYGROTON) 25 MG tablet, TAKE 1/2 TABLET BY MOUTH EVERY DAY, Disp: 45 tablet, Rfl: 3   Crisaborole (EUCRISA) 2 % OINT, Apply topically as needed. rosea, Disp: , Rfl:    hydroxypropyl methylcellulose (ISOPTO TEARS) 2.5 % ophthalmic  solution, Place 1 drop into both eyes as needed for dry eyes., Disp: , Rfl:    ibuprofen (ADVIL) 100 MG tablet, Take by mouth., Disp: , Rfl:    LECITHIN PO, Take 1 capsule 2 (two) times daily by mouth., Disp: , Rfl:    Omega-3 Fatty Acids (FISH OIL PO), Take 1 capsule by mouth 2 (two) times daily. , Disp: , Rfl:    omeprazole (PRILOSEC) 40 MG capsule, Take 40 mg by mouth as needed. , Disp: , Rfl:    ondansetron (ZOFRAN-ODT) 4 MG disintegrating tablet, as needed., Disp: , Rfl:    potassium chloride SA (KLOR-CON M20) 20 MEQ tablet, Take 2 tablets (40 mEq total) by mouth daily., Disp: 180 tablet, Rfl: 3   albuterol (VENTOLIN HFA) 108 (90 Base) MCG/ACT inhaler, Inhale into the lungs as needed. (Patient not taking: Reported on 11/23/2022), Disp: , Rfl:    azithromycin (ZITHROMAX) 250 MG tablet, Take 1 tablet (250 mg total) by mouth daily. Take first 2 tablets together, then 1 every day until finished. (Patient not taking: Reported on 11/23/2022), Disp: 6 tablet, Rfl: 0   chlorhexidine (PERIDEX) 0.12 % solution, as needed. Sore gums (Patient not taking: Reported on 11/23/2022), Disp: , Rfl:    doxycycline (MONODOX) 50 MG capsule, Take 50 mg by mouth daily. (Patient not taking: Reported on 11/23/2022), Disp: , Rfl:    guaiFENesin-codeine (ROBITUSSIN AC) 100-10 MG/5ML syrup, Take 5 mLs by mouth 3 (three) times daily as needed for cough. (Patient not taking: Reported on 11/23/2022), Disp: 120 mL, Rfl: 0   metoprolol succinate (TOPROL-XL) 25 MG 24 hr tablet, Take 1 tablet (25 mg total) by mouth daily. Please call [phone] to schedule an appointment for future refills. Thank you. (Patient not taking: Reported on 11/23/2022), Disp: 90 tablet, Rfl: 0   metroNIDAZOLE (METROCREAM) 0.75 % cream, Apply topically. (Patient not taking: Reported on 11/23/2022), Disp: , Rfl:   PAST MEDICAL HISTORY: Past Medical History:  Diagnosis Date   A-fib ([place])    Arthritis    "hands" (11/04/2015)   Dry eyes    Glaucoma     Osteopenia    Rosacea    Wears glasses     PAST SURGICAL HISTORY: Past Surgical History:  Procedure Laterality Date   ATRIAL TACH ABLATION  11/04/2015   COLONOSCOPY     ELECTROPHYSIOLOGIC STUDY N/A 11/04/2015   Procedure: A-Tach Ablation;  Surgeon: Will [name], MD;  Location: [place] [facility];  Service: Cardiovascular;  Laterality: N/A;   LEFT HEART CATHETERIZATION WITH CORONARY ANGIOGRAM N/A 03/12/2014   Procedure: LEFT HEART CATHETERIZATION WITH CORONARY ANGIOGRAM;  Surgeon: Pixie Casino, MD;  Location: [facility] CATH LAB;  Service: Cardiovascular;  Laterality: N/A;   LOOP RECORDER IMPLANT N/A 09/15/2014   Procedure: LOOP RECORDER IMPLANT;  Surgeon: Deboraha Sprang, MD;  Location: [facility];  Service: Cardiovascular;  Laterality: N/A;   LOOP RECORDER REMOVAL N/A 09/26/2017  Procedure: LOOP RECORDER REMOVAL;  Surgeon: Constance Haw, MD;  Location: [place] [facility];  Service: Cardiovascular;  Laterality: N/A;   MASS EXCISION Right 09/25/2014   Procedure: RIGHT RING FINGER EXCISION MASS AND DEBRIDEMENT DIP JOINT;  Surgeon: Leanora Cover, MD;  Location: [place];  Service: Orthopedics;  Laterality: Right;   TONSILLECTOMY AND ADENOIDECTOMY  1979   TUBAL LIGATION      FAMILY HISTORY: Family History  Problem Relation Age of Onset   Valvular heart disease Mother    Depression Father    Cancer Maternal Grandmother     SOCIAL HISTORY: Social History   Socioeconomic History   Marital status: Married    Spouse name: Not on file   Number of children: Not on file   Years of education: Not on file   Highest education level: Not on file  Occupational History   Not on file  Tobacco Use   Smoking status: Former    Packs/day: 0.50    Years: 25.00    Total pack years: 12.50    Types: Cigarettes    Quit date: 11/22/2002    Years since quitting: 20.0   Smokeless tobacco: Never  Vaping Use   Vaping Use: Never used  Substance and Sexual Activity   Alcohol  use: Not Currently   Drug use: No   Sexual activity: Not Currently  Other Topics Concern   Not on file  Social History Narrative   Not on file   Social Determinants of Health   Financial Resource Strain: Not on file  Food Insecurity: Not on file  Transportation Needs: Not on file  Physical Activity: Not on file  Stress: Not on file  Social Connections: Not on file  Intimate Partner Violence: Not on file       PHYSICAL EXAM  Vitals:   11/23/22 1304  BP: 118/66  Pulse: (!) 59  Weight: 114 lb 8 oz (51.9 kg)  Height: '5\' 3"'$  (1.6 m)    Body mass index is 20.28 kg/m.   General: The patient is well-developed and well-nourished and in no acute distress  HEENT:  Head is [place]/AT.  Sclera are anicteric.  Funduscopic exam shows normal optic discs and retinal vessels.  Neck: No carotid bruits are noted.  The neck is nontender.  Cardiovascular: The heart has a regular rate and rhythm with a normal S1 and S2. There were no murmurs, gallops or rubs.    Skin: Extremities are without rash or  edema.   Evidence of prior mild trauma left shin  Musculoskeletal:  Back is nontender  Neurologic Exam  Mental status: The patient is alert and oriented x 3 at the time of the examination. The patient has apparent normal recent and remote memory, with an apparently normal attention span and concentration ability.   Speech is normal.  Cranial nerves: Extraocular movements are full. Pupils are equal, round, and reactive to light and accomodation.  Visual fields are full.  Facial symmetry is present. There is good facial sensation to soft touch bilaterally.Facial strength is normal.  Trapezius and sternocleidomastoid strength is normal. No dysarthria is noted.  The tongue is midline, and the patient has symmetric elevation of the soft palate. No obvious hearing deficits are noted.  Motor:  Muscle bulk is normal.   Tone is normal. Strength is  5 / 5 in all 4 extremities.   Sensory: Sensory testing  is intact to pinprick, soft touch and vibration sensation in the arms, she has  mild hyperpathia to pinprick and slight reduced sensation to touch in distal feet/toes, worse on the left.   Vibration sensation was 60% at ankles and 40% at toes.   .  Coordination: Cerebellar testing reveals good finger-nose-finger and heel-to-shin bilaterally.  Gait and station: Station is normal.   Gait is normal. Tandem gait is normal for age. Romberg is negative.   Reflexes: Deep tendon reflexes are symmetric and normal bilaterally.   Plantar responses are flexor.    DIAGNOSTIC DATA (LABS, IMAGING, TESTING) - I reviewed patient records, labs, notes, testing and imaging myself where available.  Lab Results  Component Value Date   WBC 5.7 01/23/2020   HGB 12.6 01/23/2020   HCT 37.1 01/23/2020   MCV 89 01/23/2020   PLT 252 01/23/2020      Component Value Date/Time   NA 137 12/24/2021 1352   K 4.2 12/24/2021 1352   CL 97 12/24/2021 1352   CO2 26 12/24/2021 1352   GLUCOSE 90 12/24/2021 1352   GLUCOSE 130 (H) 05/31/2019 0302   BUN 11 12/24/2021 1352   CREATININE 0.50 (L) 12/24/2021 1352   CREATININE 0.57 (L) 09/22/2016 1557   CALCIUM 9.2 12/24/2021 1352   PROT 6.6 03/05/2014 1559   ALBUMIN 4.4 03/05/2014 1559   AST 21 03/05/2014 1559   ALT 20 03/05/2014 1559   ALKPHOS 40 03/05/2014 1559   BILITOT 0.6 03/05/2014 1559   GFRNONAA 96 07/02/2019 1220   GFRAA 110 07/02/2019 1220   Lab Results  Component Value Date   CHOL 172 12/19/2017   HDL 88 12/19/2017   LDLCALC 70 12/19/2017   TRIG 68 12/19/2017   CHOLHDL 2.0 12/19/2017   No results found for: "HGBA1C" No results found for: "VITAMINB12" Lab Results  Component Value Date   TSH 1.150 11/17/2021       ASSESSMENT AND PLAN  Polyneuropathy - Plan: Multiple Myeloma Panel (SPEP&IFE w/QIG), Sedimentation rate, Rheumatoid factor, Sjogren's syndrome antibods(ssa + ssb), Vitamin B12  Dysesthesia - Plan: MR LUMBAR SPINE WO CONTRAST  Dry  mouth - Plan: Sjogren's syndrome antibods(ssa + ssb)  Lumbar radiculopathy - Plan: MR LUMBAR SPINE WO CONTRAST  Chronic midline low back pain with sciatica, sciatica laterality unspecified - Plan: MR LUMBAR SPINE WO CONTRAST   In summary, Kelsey James is a 82 year old woman with numbness and pain in her feet and legs that had an asymmetric onset.  She likely has a neuropathy with mixed small and large fiber features and we will check some blood work to assess for treatable causes.  Because of the asymmetry and her back pain, we also need to check MRI of the lumbar spine to further characterize degenerative changes.  Based on the results she may need referral for an intervention or procedure..  Also based on the results, additional evaluation and treatment may be necessary.  Although she finds the symptoms uncomfortable, the pain is not severe and she would rather tolerate the symptoms then begin a medication.  I did discuss alpha lipoic acid over-the-counter can sometimes help nerve related pain.  She will consider this.  She will return to see me based on the results of the studies or if she has new or worsening neurologic symptoms.  Thank you for asking to see Kelsey James.  Please let me know when we have further assistance with her or other patients in the future.   Sadat Sliwa A. Felecia Shelling, MD, [facility] 02/14/4741, 5:95 PM Certified in Neurology, Clinical Neurophysiology, Sleep Medicine and Neuroimaging  [facility]  [address], [place], [place] [address] [phone]

## 2022-11-28 LAB — MULTIPLE MYELOMA PANEL, SERUM
Albumin SerPl Elph-Mcnc: 4.3 g/dL (ref 2.9–4.4)
Albumin/Glob SerPl: 1.9 — ABNORMAL HIGH (ref 0.7–1.7)
Alpha 1: 0.2 g/dL (ref 0.0–0.4)
Alpha2 Glob SerPl Elph-Mcnc: 0.7 g/dL (ref 0.4–1.0)
B-Globulin SerPl Elph-Mcnc: 0.8 g/dL (ref 0.7–1.3)
Gamma Glob SerPl Elph-Mcnc: 0.6 g/dL (ref 0.4–1.8)
Globulin, Total: 2.3 g/dL (ref 2.2–3.9)
IgA/Immunoglobulin A, Serum: 176 mg/dL (ref 64–422)
IgG (Immunoglobin G), Serum: 714 mg/dL (ref 586–1602)
IgM (Immunoglobulin M), Srm: 72 mg/dL (ref 26–217)
Total Protein: 6.6 g/dL (ref 6.0–8.5)

## 2022-11-28 LAB — RHEUMATOID FACTOR: Rheumatoid fact SerPl-aCnc: 10.3 IU/mL (ref <14.0)

## 2022-11-28 LAB — SJOGREN'S SYNDROME ANTIBODS(SSA + SSB)
ENA SSA (RO) Ab: 0.2 AI (ref 0.0–0.9)
ENA SSB (LA) Ab: <0.2 AI (ref 0.0–0.9)

## 2022-11-28 LAB — VITAMIN B12: Vitamin B-12: 1192 pg/mL (ref 232–1245)

## 2022-11-28 LAB — SEDIMENTATION RATE: Sed Rate: 2 mm/hr (ref 0–40)

## 2022-12-06 ENCOUNTER — Ambulatory Visit
Admission: RE | Admit: 2022-12-06 | Discharge: 2022-12-06 | Disposition: A | Discharge status: Home / Self Care | Payer: Medicare Other | Source: Ambulatory Visit | Attending: Neurology | Admitting: Neurology

## 2022-12-06 DIAGNOSIS — M5416 Radiculopathy, lumbar region: Secondary | ICD-10-CM | POA: Diagnosis not present

## 2022-12-06 DIAGNOSIS — G8929 Other chronic pain: Secondary | ICD-10-CM

## 2022-12-06 DIAGNOSIS — R208 Other disturbances of skin sensation: Secondary | ICD-10-CM

## 2022-12-13 ENCOUNTER — Telehealth: Payer: Self-pay | Admitting: Neurology

## 2022-12-13 DIAGNOSIS — G8929 Other chronic pain: Secondary | ICD-10-CM

## 2022-12-13 DIAGNOSIS — R208 Other disturbances of skin sensation: Secondary | ICD-10-CM

## 2022-12-13 DIAGNOSIS — M5416 Radiculopathy, lumbar region: Secondary | ICD-10-CM

## 2022-12-13 DIAGNOSIS — G629 Polyneuropathy, unspecified: Secondary | ICD-10-CM

## 2022-12-13 NOTE — Telephone Encounter (Addendum)
Dr. Felecia Shelling sent mychart message to pt about MRI Lumbar results on 12/06/22:  "The MRI of the lumbar spine showed significant degenerative changes at the L4-L5 level that could affect the L4 and L5 nerve roots.  Other levels also show degenerative changes that are not as bad. If your back or leg pain worsens we could consider having you do an epidural steroid injection"  Dr. Felecia Shelling sent mychart message to pt 11/28/22 about lab results: "The lab work was normal"

## 2022-12-13 NOTE — Telephone Encounter (Signed)
LVM relaying results per Dr. Garth Bigness notes. Asked her to call back if she has questions. Also advised her to call if pain has worsened and she would like [place] to place order for ESI.

## 2022-12-13 NOTE — Telephone Encounter (Signed)
Pt is calling. Stated she is following up on MRI results and lab results. Pt is requesting a call back from nurse.

## 2022-12-15 NOTE — Addendum Note (Signed)
Addended by: Wyvonnia Lora on: 12/15/2022 05:11 PM   Modules accepted: Orders

## 2022-12-15 NOTE — Telephone Encounter (Signed)
Called pt and relayed Dr. Garth Bigness message. She is agreeable to referral to neurosurgery. I placed referral. Aware referral will be sent next week and then neurosurgery will be in touch after to schedule appt.

## 2022-12-15 NOTE — Telephone Encounter (Signed)
Took call from pt. She received VM about results. She states she cannot do ESI. 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 Sx are worsening, she reports she is losing sensation in feet more/more balance issues.   She brought up dry mouth/dry eyes issues. Informed her that labs looked ok.   She brought up fact that she had stem cell/protein replacement about 3-4 yr ago. This helped coccyx area. Wondering if she can do this again?    Aware I will send to MD for review and we will f/u latest Monday with her. She verbalized understanding.

## 2022-12-16 ENCOUNTER — Telehealth: Payer: Self-pay | Admitting: Neurology

## 2022-12-16 NOTE — Telephone Encounter (Signed)
Referral for Neurosurgery fax to [facility]. Phone: [phone], Fax: [phone]. 

## 2022-12-29 ENCOUNTER — Telehealth: Payer: Self-pay | Admitting: Neurology

## 2022-12-29 NOTE — Telephone Encounter (Signed)
She saw Dr. Marcello Moores at [facility] neurosurgery for consultation.  He also feels that the degenerative changes at L4-L5 are likely the main cause of her symptoms.  She will try physical therapy and consider L4-L5 PLIF if no benefit.

## 2023-02-15 ENCOUNTER — Other Ambulatory Visit: Payer: Self-pay | Admitting: Student

## 2023-03-16 ENCOUNTER — Other Ambulatory Visit: Payer: Self-pay | Admitting: Student

## 2023-04-11 ENCOUNTER — Other Ambulatory Visit: Payer: Self-pay | Admitting: Student

## 2023-04-13 MED ORDER — POTASSIUM CHLORIDE CRYS ER 20 MEQ PO TBCR: EXTENDED_RELEASE_TABLET | ORAL | 0 refills | Status: DC

## 2023-04-15 ENCOUNTER — Other Ambulatory Visit: Payer: Self-pay | Admitting: Student

## 2023-10-13 ENCOUNTER — Other Ambulatory Visit: Payer: Self-pay | Admitting: Internal Medicine

## 2023-11-23 ENCOUNTER — Other Ambulatory Visit: Payer: Self-pay | Admitting: Gastroenterology

## 2023-11-23 DIAGNOSIS — R1310 Dysphagia, unspecified: Secondary | ICD-10-CM

## 2023-12-19 ENCOUNTER — Ambulatory Visit
Admission: RE | Admit: 2023-12-19 | Discharge: 2023-12-19 | Disposition: A | Discharge status: Home / Self Care | Payer: Medicare HMO | Source: Ambulatory Visit | Attending: Gastroenterology | Admitting: Gastroenterology

## 2023-12-19 DIAGNOSIS — R1310 Dysphagia, unspecified: Secondary | ICD-10-CM

## 2023-12-20 NOTE — Progress Notes (Signed)
 [facility] [place]  Acute Care Visit Kelsey James DOB: [birth year]  MRN: [id number] Visit Date: 12/20/2023  Encounter Provider: Lucie Reus, MD  Subjective:   Kelsey James is a 83 y.o. female.    History of Present Illness The patient presents for evaluation of neuropathy, sinus issues, and sleep issues.  She has been experiencing persistent ear discomfort for several weeks, which has not shown signs of improvement. She reports a sensation of fluid accumulation in her ear, particularly noticeable upon awakening this morning. Additionally, she mentions sinus-related issues, including a choking sensation and the production of a yellowish film during coughing episodes. Her current regimen includes frequent use of saline nasal spray, approximately 4 to 5 times daily, and occasional use of Little Noses spray when nasal congestion is severe. She does not use Afrin. She is not currently taking any allergy medications such as Zyrtec, Claritin, or Allegra. She also reports using a non-steroidal nasal spray, the name of which she can not recall, but notes its effectiveness. She has been taking green pills similar to Benadryl for dry eyes.  She has been managing neuropathy with nonsurgical treatments, including vibration, electrical stimulation, and LED light therapy, but reports no significant improvement. She has sought care from [facility], where she underwent plasma protein therapy and bone marrow extraction, which were then reinjected into her knees. This treatment has resulted in a substantial reduction in knee inflammation. However, she was informed that there is limited hope for a complete resolution of her neuropathy. She has been advised to consider low-dose naltrexone as a potential treatment option. She has discontinued her visits to the pain management specialist due to fear following a previous injection. She has been receiving back injections from [facility],  which have provided some relief. She maintains a daily exercise routine, which she believes contributes to her leg strength. She is not currently taking gabapentin.  She takes alprazolam  0.5 mg at night for sleep. She has bad sleep habits and often wakes up at 1:00 AM and stays awake until 5:00 AM. She reports that her recall is getting a little harder, but some days she can remember well. She can still play cards and remembers the information related to the game. She was previously taking alprazolam  3 times a day.  MEDICATIONS Current: Saline nasal spray, Little Noses, alprazolam , Benadryl (for dry eyes)     Objective:  BP 137/76   Pulse 57   Temp 98.2 F (36.8 C)   Resp 16   Ht 1.6 m (5' 3)   Wt 53.5 kg (118 lb)   SpO2 97%   BMI 20.90 kg/m   Physical Exam Constitutional:      General: She is not in acute distress.    Appearance: Normal appearance. She is not diaphoretic.  HENT:     Right Ear: Tympanic membrane normal.     Left Ear: A middle ear effusion is present. Tympanic membrane is bulging (slightly).  Eyes:     General: No scleral icterus. Cardiovascular:     Rate and Rhythm: Normal rate.     Pulses: Normal pulses.  Pulmonary:     Effort: Pulmonary effort is normal.  Skin:    General: Skin is warm and dry.     Coloration: Skin is not jaundiced or pale.  Neurological:     Mental Status: She is alert and oriented to person, place, and time. Mental status is at baseline.  Psychiatric:        Mood  and Affect: Mood normal.        Behavior: Behavior normal.        Thought Content: Thought content normal.        Judgment: Judgment normal.      Physical Exam There is a little pressure behind the ear. The eardrum is not totally bulging out, but part of it is trying to push out some fluid. The lymph node on one side is more swollen than the other. Nose and throat were examined.    Labs: No results found for this or any previous visit (from the past  week). Results      Assessment/Plan:   1. Left otitis media with effusion      2. Neuropathic pain        Assessment & Plan 1. Sinus issues. The patient's symptoms suggest a possible blockage in the eustachian tube, preventing fluid drainage and pressure equalization. She is advised to continue using saline nasal spray 4 to 5 times a day to keep the mucus thin and facilitate drainage. She is also encouraged to perform facial and neck massages to aid in sinus and eustachian tube drainage. The application of heat to the affected area is recommended for additional relief. She may temporarily use the non-steroidal nasal spray she has at home, 1 puff at night, while continuing with the tablets. If the nasal spray is expired, she will call in for approval to pick up a fresh one.  2. Neuropathy. A prescription for low-dose naltrexone 1.5 mg capsules will be provided through [facility]. She is instructed to take 1 capsule daily in the morning for 10 days, then increase to 2 capsules daily in the morning for the next 10 days, and finally increase to 3 capsules daily in the morning. The goal is to reach a daily dose of 4.5 mg. If she experiences mild side effects, she should maintain the current dose until she feels comfortable before increasing it, even if the medication does not seem effective. She is advised to avoid opioid pain medications such as tramadol , oxycodone , or hydrocodone  while on this treatment. She will inform us  or the pharmacist of the dose she wishes to continue before her prescription runs out so that the next batch can be formulated accordingly.  3. Sleep issues. She takes alprazolam  0.5 mg at night for sleep. She has bad sleep habits and often wakes up at 1:00 AM and stays awake until 5:00 AM. She reports that her recall is getting a little harder, but some days she can remember well. She can still play cards and remembers the information related to the game. She was  previously taking alprazolam  3 times a day.  PROCEDURE The patient underwent plasma protein therapy and bone marrow extraction, which were then reinjected into her knees. She has also been receiving back injections from [facility].       No follow-ups on file.   There are no Patient Instructions on file for this visit.   I have personally spent 35 minutes involved in face-to-face and non-face-to-face activities for this patient on the day of the visit.  Professional time spent includes the following activities, in addition to those noted in the documentation:  - preparing to see the patient (e.g., review of recent and/or remote lab/imaging/study results, provider notes, and patient messages/phone calls available in current EMR, CareEverywhere, and scanned records) -obtaining and/or reviewing separately obtained history either through past provider notes, patient phone calls, and/or patient's family member(s)/caregiver(s) -performing a  medically appropriate examination and/or evaluation -counseling and educating the patient/family/caregiver -ordering medications, tests, or procedures -documenting clinical information in the electronic or other health record -reviewing most up to date studies or expert consensus guidelines for screening/diagnosing/treating pertinent conditions/symptoms -independently interpreting results (not separately reported) and communicating results to the patient/family/caregiver -care coordination (not separately reported) -referring and communicating with other health care professionals (when not separately reported)   Electronically signed by: Lucie Reus, MD 01/03/2024 7:06 AM

## 2024-01-08 ENCOUNTER — Other Ambulatory Visit: Payer: Self-pay | Admitting: Internal Medicine

## 2024-03-18 ENCOUNTER — Other Ambulatory Visit: Payer: Self-pay | Admitting: Internal Medicine

## 2024-06-03 ENCOUNTER — Ambulatory Visit (INDEPENDENT_AMBULATORY_CARE_PROVIDER_SITE_OTHER)

## 2024-06-03 ENCOUNTER — Encounter: Payer: Self-pay | Admitting: Podiatry

## 2024-06-03 ENCOUNTER — Ambulatory Visit: Admitting: Podiatry

## 2024-06-03 VITALS — Ht 63.0 in | Wt 114.5 lb

## 2024-06-03 DIAGNOSIS — M25872 Other specified joint disorders, left ankle and foot: Secondary | ICD-10-CM

## 2024-06-03 DIAGNOSIS — M2042 Other hammer toe(s) (acquired), left foot: Secondary | ICD-10-CM | POA: Diagnosis not present

## 2024-06-03 DIAGNOSIS — M7752 Other enthesopathy of left foot: Secondary | ICD-10-CM | POA: Diagnosis not present

## 2024-06-03 NOTE — Progress Notes (Signed)
 Chief Complaint  Patient presents with   Toe Pain    Pt is here due to left foot toe pain she states that her 2nd and 3rd toes have been painful states she has an extra bone in the 2nd toe that rubs against the 3rd toe and is bothering her for quite a while.    HPI: 83 y.o. female presenting today as a new patient for evaluation of pain and tenderness associated to the left foot ongoing for several years.  Progressively getting worse with time.  She says that she has had chronic pain and tenderness associated to the plantar aspect of the first MTP of the left foot ongoing for several years.  No history of injury.  She has tried different shoes and arch supports with no relief to offload pressure from the joint.  Patient also has pain and tenderness associated to the fourth toe of the left foot.  She has a history of osteoarthritis and her fourth toe is painful despite different shoe gear modifications and wide fitting shoes.  She is interested in surgery to correct for these issues  Past Medical History:  Diagnosis Date   A-fib (HCC)    Arthritis    hands (11/04/2015)   Dry eyes    Glaucoma    Osteopenia    Rosacea    Wears glasses     Past Surgical History:  Procedure Laterality Date   ATRIAL TACH ABLATION  11/04/2015   COLONOSCOPY     ELECTROPHYSIOLOGIC STUDY N/A 11/04/2015   Procedure: A-Tach Ablation;  Surgeon: Will Gladis Norton, MD;  Location: [facility];  Service: Cardiovascular;  Laterality: N/A;   LEFT HEART CATHETERIZATION WITH CORONARY ANGIOGRAM N/A 03/12/2014   Procedure: LEFT HEART CATHETERIZATION WITH CORONARY ANGIOGRAM;  Surgeon: Vinie KYM Maxcy, MD;  Location: [facility] CATH LAB;  Service: Cardiovascular;  Laterality: N/A;   LOOP RECORDER IMPLANT N/A 09/15/2014   Procedure: LOOP RECORDER IMPLANT;  Surgeon: Elspeth JAYSON Sage, MD;  Location: [facility] CATH LAB;  Service: Cardiovascular;  Laterality: N/A;   LOOP RECORDER REMOVAL N/A 09/26/2017   Procedure: LOOP RECORDER  REMOVAL;  Surgeon: Norton Soyla Gladis, MD;  Location: [facility];  Service: Cardiovascular;  Laterality: N/A;   MASS EXCISION Right 09/25/2014   Procedure: RIGHT RING FINGER EXCISION MASS AND DEBRIDEMENT DIP JOINT;  Surgeon: Franky Curia, MD;  Location: [facility];  Service: Orthopedics;  Laterality: Right;   TONSILLECTOMY AND ADENOIDECTOMY  1979   TUBAL LIGATION      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  Dermatology: Skin is warm, dry and supple bilateral lower extremities.   Vascular: Palpable pedal pulses bilaterally. Capillary refill within normal limits.  No appreciable edema.  No erythema.  Neurological: Grossly intact via light touch  Musculoskeletal Exam: Tenderness and pain with direct palpation of the fibular sesamoid of the left foot.  There is also pain and tenderness associated to the DIPJ of the left fourth toe.  Radiographic Exam LT foot 06/03/2024:  Degenerative changes noted with  periarticular spurring to the DIPJ of the fourth digit left foot and curvature of the toe consistent with hammertoe deformity.  There is also some irregularity noted to the fibular sesamoid left foot  Assessment/Plan of Care: 1.  Chronic progressive fibular sesamoiditis left 2.  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  X-rays reviewed -Unfortunately the patient has tried multiple conservative modalities to offload pressure from the symptomatic areas with no relief.  She is very frustrated and I  do believe that surgery is warranted at this time -Surgery would include excision of the fibular sesamoid as well as DIPJ arthroplasty of the fourth toe of the left foot.  Risk benefits advantages and disadvantages of the procedure as well as the postoperative recovery course were explained in length in detail to the patient.  No guarantees were expressed or implied.  All patient questions answered.  The patient consents would like to proceed with surgery -Authorization for surgery was initiated at this time.  Surgery will consist of fibular sesamoidectomy left.  DIPJ arthroplasty fourth digit left -Return to clinic 1 week postop      Thresa EMERSON Sar, DPM [facility]  Dr. Thresa EMERSON Sar, DPM    [address] [place], [place] [address]                Office [phone]  Fax [phone]

## 2024-06-27 ENCOUNTER — Other Ambulatory Visit: Payer: Self-pay

## 2024-06-27 ENCOUNTER — Encounter (HOSPITAL_BASED_OUTPATIENT_CLINIC_OR_DEPARTMENT_OTHER): Payer: Self-pay | Admitting: Orthopedic Surgery

## 2024-07-01 ENCOUNTER — Encounter (HOSPITAL_BASED_OUTPATIENT_CLINIC_OR_DEPARTMENT_OTHER)
Admission: RE | Admit: 2024-07-01 | Discharge: 2024-07-01 | Disposition: A | Discharge status: Home / Self Care | Source: Ambulatory Visit | Attending: Orthopedic Surgery | Admitting: Orthopedic Surgery

## 2024-07-01 DIAGNOSIS — Z87891 Personal history of nicotine dependence: Secondary | ICD-10-CM | POA: Diagnosis not present

## 2024-07-01 DIAGNOSIS — I10 Essential (primary) hypertension: Secondary | ICD-10-CM | POA: Diagnosis not present

## 2024-07-01 DIAGNOSIS — K219 Gastro-esophageal reflux disease without esophagitis: Secondary | ICD-10-CM | POA: Diagnosis not present

## 2024-07-01 DIAGNOSIS — M19041 Primary osteoarthritis, right hand: Secondary | ICD-10-CM | POA: Diagnosis present

## 2024-07-01 DIAGNOSIS — F419 Anxiety disorder, unspecified: Secondary | ICD-10-CM | POA: Diagnosis not present

## 2024-07-01 DIAGNOSIS — Z79899 Other long term (current) drug therapy: Secondary | ICD-10-CM | POA: Diagnosis not present

## 2024-07-01 LAB — BASIC METABOLIC PANEL WITH GFR
Anion gap: 8 (ref 5–15)
BUN: 11 mg/dL (ref 8–23)
CO2: 26 mmol/L (ref 22–32)
Calcium: 8.7 mg/dL — ABNORMAL LOW (ref 8.9–10.3)
Chloride: 102 mmol/L (ref 98–111)
Creatinine, Ser: 0.48 mg/dL (ref 0.44–1.00)
GFR, Estimated: >60 mL/min (ref >60)
Glucose, Bld: 94 mg/dL (ref 70–99)
Potassium: 3.9 mmol/L (ref 3.5–5.1)
Sodium: 136 mmol/L (ref 135–145)

## 2024-07-01 NOTE — Progress Notes (Signed)

## 2024-07-03 ENCOUNTER — Ambulatory Visit (HOSPITAL_BASED_OUTPATIENT_CLINIC_OR_DEPARTMENT_OTHER): Payer: Self-pay | Admitting: Anesthesiology

## 2024-07-03 ENCOUNTER — Ambulatory Visit (HOSPITAL_BASED_OUTPATIENT_CLINIC_OR_DEPARTMENT_OTHER)
Admission: RE | Admit: 2024-07-03 | Discharge: 2024-07-03 | Disposition: A | Discharge status: Home / Self Care | Attending: Orthopedic Surgery | Admitting: Orthopedic Surgery

## 2024-07-03 ENCOUNTER — Other Ambulatory Visit: Payer: Self-pay

## 2024-07-03 ENCOUNTER — Encounter (HOSPITAL_BASED_OUTPATIENT_CLINIC_OR_DEPARTMENT_OTHER): Payer: Self-pay | Admitting: Orthopedic Surgery

## 2024-07-03 ENCOUNTER — Encounter (HOSPITAL_BASED_OUTPATIENT_CLINIC_OR_DEPARTMENT_OTHER)
Admission: RE | Disposition: A | Discharge status: Home / Self Care | Payer: Self-pay | Source: Home / Self Care | Attending: Orthopedic Surgery

## 2024-07-03 DIAGNOSIS — Z87891 Personal history of nicotine dependence: Secondary | ICD-10-CM

## 2024-07-03 DIAGNOSIS — M19041 Primary osteoarthritis, right hand: Secondary | ICD-10-CM | POA: Diagnosis not present

## 2024-07-03 DIAGNOSIS — K219 Gastro-esophageal reflux disease without esophagitis: Secondary | ICD-10-CM | POA: Insufficient documentation

## 2024-07-03 DIAGNOSIS — F419 Anxiety disorder, unspecified: Secondary | ICD-10-CM | POA: Insufficient documentation

## 2024-07-03 DIAGNOSIS — Z79899 Other long term (current) drug therapy: Secondary | ICD-10-CM | POA: Insufficient documentation

## 2024-07-03 DIAGNOSIS — I10 Essential (primary) hypertension: Secondary | ICD-10-CM | POA: Diagnosis not present

## 2024-07-03 DIAGNOSIS — I480 Paroxysmal atrial fibrillation: Secondary | ICD-10-CM | POA: Diagnosis not present

## 2024-07-03 HISTORY — PX: DISTAL INTERPHALANGEAL JOINT FUSION: SHX6428

## 2024-07-03 HISTORY — DX: Gastro-esophageal reflux disease without esophagitis: K21.9

## 2024-07-03 HISTORY — DX: Essential (primary) hypertension: I10

## 2024-07-03 SURGERY — DISTAL INTERPHALANGEAL JOINT FUSION
Anesthesia: Monitor Anesthesia Care | Site: Finger | Laterality: Right

## 2024-07-03 MED ORDER — BACITRACIN ZINC 500 UNIT/GM EX OINT
TOPICAL_OINTMENT | CUTANEOUS | Status: AC
  Filled 2024-07-03: qty 28.35

## 2024-07-03 MED ORDER — FENTANYL CITRATE (PF) 100 MCG/2ML IJ SOLN
INTRAMUSCULAR | Status: DC | PRN
  Administered 2024-07-03: 25 ug via INTRAVENOUS

## 2024-07-03 MED ORDER — BUPIVACAINE HCL (PF) 0.25 % IJ SOLN
INTRAMUSCULAR | Status: DC | PRN
  Administered 2024-07-03: 10 mL

## 2024-07-03 MED ORDER — TRAMADOL HCL 50 MG PO TABS: 50.0000 mg | ORAL_TABLET | Freq: Four times a day (QID) | ORAL | 0 refills | Status: AC | PRN

## 2024-07-03 MED ORDER — LACTATED RINGERS IV SOLN: INTRAVENOUS | Status: DC

## 2024-07-03 MED ORDER — CEFAZOLIN SODIUM-DEXTROSE 2-4 GM/100ML-% IV SOLN
2.0000 g | INTRAVENOUS | Status: AC
  Administered 2024-07-03: 2 g via INTRAVENOUS

## 2024-07-03 MED ORDER — CEFAZOLIN SODIUM-DEXTROSE 2-4 GM/100ML-% IV SOLN
INTRAVENOUS | Status: AC
  Filled 2024-07-03: qty 100

## 2024-07-03 MED ORDER — PROPOFOL 500 MG/50ML IV EMUL
INTRAVENOUS | Status: DC | PRN
  Administered 2024-07-03: 120 ug/kg/min via INTRAVENOUS

## 2024-07-03 MED ORDER — OXYCODONE HCL 5 MG PO TABS: 5.0000 mg | ORAL_TABLET | Freq: Once | ORAL | Status: DC | PRN

## 2024-07-03 MED ORDER — ONDANSETRON 4 MG PO TBDP: 4.0000 mg | ORAL_TABLET | Freq: Three times a day (TID) | ORAL | 0 refills | Status: AC | PRN

## 2024-07-03 MED ORDER — MIDAZOLAM HCL 2 MG/2ML IJ SOLN
INTRAMUSCULAR | Status: DC | PRN
  Administered 2024-07-03: 1 mg via INTRAVENOUS

## 2024-07-03 MED ORDER — LIDOCAINE HCL (CARDIAC) PF 100 MG/5ML IV SOSY
PREFILLED_SYRINGE | INTRAVENOUS | Status: DC | PRN
  Administered 2024-07-03: 20 mg via INTRAVENOUS

## 2024-07-03 MED ORDER — 0.9 % SODIUM CHLORIDE (POUR BTL) OPTIME
TOPICAL | Status: DC | PRN
Start: 2024-07-03 — End: 2024-07-03
  Administered 2024-07-03: 1000 mL

## 2024-07-03 MED ORDER — FENTANYL CITRATE (PF) 100 MCG/2ML IJ SOLN
INTRAMUSCULAR | Status: AC
  Filled 2024-07-03: qty 2

## 2024-07-03 MED ORDER — SODIUM CHLORIDE 0.9 % IV SOLN: 12.5000 mg | INTRAVENOUS | Status: DC | PRN

## 2024-07-03 MED ORDER — HYDROMORPHONE HCL 1 MG/ML IJ SOLN: 0.2500 mg | INTRAMUSCULAR | Status: DC | PRN

## 2024-07-03 MED ORDER — EPHEDRINE SULFATE (PRESSORS) 50 MG/ML IJ SOLN
INTRAMUSCULAR | Status: DC | PRN
  Administered 2024-07-03 (×2): 5 mg via INTRAVENOUS

## 2024-07-03 MED ORDER — MIDAZOLAM HCL 2 MG/2ML IJ SOLN
INTRAMUSCULAR | Status: AC
Start: 2024-07-03 — End: 2024-07-03
  Filled 2024-07-03: qty 2

## 2024-07-03 MED ORDER — ONDANSETRON HCL 4 MG/2ML IJ SOLN
INTRAMUSCULAR | Status: DC | PRN
  Administered 2024-07-03: 4 mg via INTRAVENOUS

## 2024-07-03 MED ORDER — OXYCODONE HCL 5 MG/5ML PO SOLN: 5.0000 mg | Freq: Once | ORAL | Status: DC | PRN

## 2024-07-03 SURGICAL SUPPLY — 41 items
BLADE SURG 15 STRL LF DISP TIS (BLADE) ×1 IMPLANT
BNDG COHESIVE 1X5 TAN STRL LF (GAUZE/BANDAGES/DRESSINGS) IMPLANT
BNDG COMPR ESMARK 4X3 LF (GAUZE/BANDAGES/DRESSINGS) ×1 IMPLANT
BNDG ELASTIC 3INX 5YD STR LF (GAUZE/BANDAGES/DRESSINGS) ×1 IMPLANT
BNDG GAUZE DERMACEA FLUFF 4 (GAUZE/BANDAGES/DRESSINGS) ×1 IMPLANT
BUR PEAR (BURR) IMPLANT
CHLORAPREP W/TINT 26 (MISCELLANEOUS) ×1 IMPLANT
CORD BIPOLAR FORCEPS 12FT (ELECTRODE) ×1 IMPLANT
COVER BACK TABLE 60X90IN (DRAPES) ×1 IMPLANT
CUFF TOURN SGL QUICK 18X4 (TOURNIQUET CUFF) ×1 IMPLANT
CUFF TRNQT CYL 24X4X16.5-23 (TOURNIQUET CUFF) IMPLANT
DRAIN PENROSE 12X.25 LTX STRL (MISCELLANEOUS) IMPLANT
DRAPE EXTREMITY T 121X128X90 (DISPOSABLE) ×1 IMPLANT
DRAPE OEC MINIVIEW 54X84 (DRAPES) ×1 IMPLANT
DRAPE SURG 17X23 STRL (DRAPES) ×1 IMPLANT
DRILL NANO ACTRK 3 (DRILL) IMPLANT
GAUZE SPONGE 4X4 12PLY STRL (GAUZE/BANDAGES/DRESSINGS) IMPLANT
GAUZE XEROFORM 1X8 LF (GAUZE/BANDAGES/DRESSINGS) ×1 IMPLANT
GLOVE BIO SURGEON STRL SZ7 (GLOVE) ×1 IMPLANT
GOWN STRL REUS W/ TWL LRG LVL3 (GOWN DISPOSABLE) ×2 IMPLANT
GUIDEWIRE DBL END .7 (WIRE) IMPLANT
KWIRE .9 (WIRE) IMPLANT
NDL HYPO 25X1 1.5 SAFETY (NEEDLE) IMPLANT
NEEDLE HYPO 25X1 1.5 SAFETY (NEEDLE) IMPLANT
NS IRRIG 1000ML POUR BTL (IV SOLUTION) ×1 IMPLANT
PACK BASIN DAY SURGERY FS (CUSTOM PROCEDURE TRAY) ×1 IMPLANT
PAD CAST 3X4 CTTN HI CHSV (CAST SUPPLIES) IMPLANT
SCREW ACUTRAK NANO 3X28 (Screw) IMPLANT
SHEET MEDIUM DRAPE 40X70 STRL (DRAPES) ×1 IMPLANT
SLEEVE SCD COMPRESS KNEE MED (STOCKING) IMPLANT
SPLINT FIBERGLASS 4X30 (CAST SUPPLIES) IMPLANT
SPLINT PLASTER CAST XFAST 4X15 (CAST SUPPLIES) ×10 IMPLANT
SUCTION TUBE FRAZIER 10FR DISP (SUCTIONS) IMPLANT
SUT ETHILON 4 0 PS 2 18 (SUTURE) ×1 IMPLANT
SUT MNCRL AB 3-0 PS2 18 (SUTURE) ×1 IMPLANT
SUT VIC AB 4-0 PS2 18 (SUTURE) IMPLANT
SUT VICRYL RAPID 5 0 P 3 (SUTURE) IMPLANT
SYR BULB EAR ULCER 3OZ GRN STR (SYRINGE) IMPLANT
SYR CONTROL 10ML LL (SYRINGE) IMPLANT
TOWEL GREEN STERILE FF (TOWEL DISPOSABLE) ×2 IMPLANT
UNDERPAD 30X36 HEAVY ABSORB (UNDERPADS AND DIAPERS) ×1 IMPLANT

## 2024-07-03 NOTE — Op Note (Signed)
 Date of Surgery: 07/03/2024  INDICATIONS: Patient is a 83 y.o.-year-old female with right index and middle finger distal interphalangeal ostearthritis.  Risks, benefits, and alternatives to surgery were again discussed with the patient in the preoperative area. The patient wishes to proceed with surgery.  Informed consent was signed after our discussion.   PREOPERATIVE DIAGNOSIS:  Right index finger DIP arthritis Right middle finger DIP arthritis  POSTOPERATIVE DIAGNOSIS: Same.  PROCEDURE:  Right index finger DIP arthrodesis Right middle finer DIP arthrodesis   SURGEON: Carlin Galla, M.D.  ASSIST: None  ANESTHESIA:  Local, MAC  IV FLUIDS AND URINE: See anesthesia.  ESTIMATED BLOOD LOSS: 10 mL.  IMPLANTS:  Implant Name Type Inv. Item Serial No. Manufacturer Lot No. LRB No. Used Action  SCREW ACUTRAK NANO 3X28 - [id number] Screw SCREW ACUTRAK NANO 3X28  [employer]  Right 2 Implanted  SCREW ACUTRAK NANO 3X28 - [id number] Screw SCREW ACUTRAK NANO 3X28  [employer] ON SET Right 2 Implanted     DRAINS: None  COMPLICATIONS: None noted  DESCRIPTION OF PROCEDURE:   The patient was met in the preoperative holding area where the surgical site was marked and the informed consent form was signed.  The patient was then brought back to the operating room and remained on the stretcher.  A hand table was placed adjacent to the operative extremity and locked into place.  A formal timeout was performed to confirm that this was the correct patient, surgical side, surgical site, and surgical procedure.  All were present and in agreement. Following formal timeout, a local block was performed using 10 mL of 0.25% plain marcaine . Preoperative antibiotics were given.  The right upper extremity was then prepped and draped in the usual and sterile fashion.   Following formal timeout, 1/4 inch Penrose drain was wrapped around the base of the index and middle fingers, pulled taut, and clamped to  serve as a tourniquet.  An H shaped incision was made over the dorsum of the index and middle fingers at the DIP joint.  Full-thickness skin flaps were elevated.  The extensor tendon was identified and a transverse tenotomy was made at the level of the joint.  The collateral ligaments were released to allow for full flexion of the joint and access to the articular surfaces.  A rondure was used to debride the articular cartilage from both the middle phalangeal head as well as the distal phalangeal base.  There were significant osteophytes from the distal phalangeal bases.  Once the articular cartilage had been completely removed down to good, bleeding, cancellous bone, and appropriate sized guidewire was initially placed in antegrade fashion down the distal phalanx.  The joint was reduced and the guidewire driven back in a retrograde fashion across the DIP joint into the base of the middle phalanx.  AP and lateral views showed appropriate reduction of the joint and good apposition of the fusion surfaces.  The screw lengths were then determined.  I used a 28 mm screw for both digits.  The guidewires were then driven into the head of the proximal phalanx so that the wire would not be removed during drilling.  A cannulated drill was used to drill across the fusion site into the middle phalanx.  The appropriate screws were then placed by hand across the DIP joint with excellent purchase.  AP and lateral views of both finger show that the fusion site was appropriately reduced, the screw was the appropriate length, and the trailing edge of the  screw was well within the distal phalanx.  The K wires were then removed.  The wounds were then cleaned and thoroughly irrigated.  The extensor tendon was repaired using a 4-0 Vicryl Rapide suture.  The skin was then closed using a 5-0 Vicryl Rapide suture in combination of simple interrupted and horizontal mattress fashion.  The wounds were then cleaned and dressed with bacitracin   ointment, Xeroform, folded Kerlix.  The Penrose drains were removed.  The fingertips were pink and well-perfused with bleeding from the fingertips.  A well-padded radial gutter splint was then applied and extended all the way out to be on the fingertips.  The patient was reversed from sedation.  All counts were correct x 2 at the end of the procedure.  The patient was then taken to the PACU in stable condition.   POSTOPERATIVE PLAN: She will be discharged to home with appropriate pain medication and discharge instructions.  A referral was placed to hand therapy.  I will see her back in 10 to 14 days for her first postop visit.  Carlin Galla, MD 12:39 PM

## 2024-07-03 NOTE — Discharge Instructions (Addendum)
 Kelsey James, M.D. Hand Surgery  POST-OPERATIVE DISCHARGE INSTRUCTIONS   PRESCRIPTIONS: You may have been given a prescription to be taken as directed for post-operative pain control.  You may also take over the counter ibuprofen /aleve  and tylenol  for pain. Take this as directed on the packaging. Do not exceed 3000 mg tylenol /acetaminophen  in 24 hours.  Ibuprofen  600-800 mg (3-4) tablets by mouth every 6 hours as needed for pain.  OR Aleve  2 tablets by mouth every 12 hours (twice daily) as needed for pain.  AND/OR Tylenol  1000 mg (2 tablets) every 8 hours as needed for pain.  Please use your pain medication carefully, as refills are limited and you may not be provided with one.  As stated above, please use over the counter pain medicine - it will also be helpful with decreasing your swelling.    ANESTHESIA: After your surgery, post-surgical discomfort or pain is likely. This discomfort can last several days to a few weeks. At certain times of the day your discomfort may be more intense.   Did you receive a nerve block?  A nerve block can provide pain relief for one hour to two days after your surgery. As long as the nerve block is working, you will experience little or no sensation in the area the surgeon operated on.  As the nerve block wears off, you will begin to experience pain or discomfort. It is very important that you begin taking your prescribed pain medication before the nerve block fully wears off. Treating your pain at the first sign of the block wearing off will ensure your pain is better controlled and more tolerable when full-sensation returns. Do not wait until the pain is intolerable, as the medicine will be less effective. It is better to treat pain in advance than to try and catch up.   General Anesthesia:  If you did not receive a nerve block during your surgery, you will need to start taking your pain medication shortly after your surgery and should continue  to do so as prescribed by your surgeon.     ICE AND ELEVATION: You may use ice for the first 48-72 hours, but it is not critical.   Motion of your fingers is very important to decrease the swelling.  Elevation, as much as possible for the next 48 hours, is critical for decreasing swelling as well as for pain relief. Elevation means when you are seated or lying down, you hand should be at or above your heart. When walking, the hand needs to be at or above the level of your elbow.  If the bandage gets too tight, it may need to be loosened. Please contact our office and we will instruct you in how to do this.    SURGICAL BANDAGES:  Keep your dressing and/or splint clean and dry at all times.  Do not remove until you are seen again in the office.  If careful, you may place a plastic bag over your bandage and tape the end to shower, but be careful, do not get your bandages wet.     HAND THERAPY:  You will be contacted to set up your first therapy visit.    ACTIVITY AND WORK: You are encouraged to move any fingers which are not in the bandage.  Light use of the fingers is allowed to assist the other hand with daily hygiene and eating, but strong gripping or lifting is often uncomfortable and should be avoided.  You might miss a variable  period of time from work and hopefully this issue has been discussed prior to surgery. You may not do any heavy work with your affected hand for about 2 weeks.    [facility] Second Floor, [address] [place], [place] [address] [phone]     Post Anesthesia Home Care Instructions  Activity: Get plenty of rest for the remainder of the day. A responsible individual must stay with you for 24 hours following the procedure.  For the next 24 hours, DO NOT: -Drive a car -Advertising copywriter -Drink alcoholic beverages -Take any medication unless instructed by your physician -Make any legal decisions or sign important papers.  Meals: Start  with liquid foods such as gelatin or soup. Progress to regular foods as tolerated. Avoid greasy, spicy, heavy foods. If nausea and/or vomiting occur, drink only clear liquids until the nausea and/or vomiting subsides. Call your physician if vomiting continues.  Special Instructions/Symptoms: Your throat may feel dry or sore from the anesthesia or the breathing tube placed in your throat during surgery. If this causes discomfort, gargle with warm salt water. The discomfort should disappear within 24 hours.

## 2024-07-03 NOTE — Interval H&P Note (Signed)
 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  The various methods of treatment have been discussed with the patient and family. After consideration of risks, benefits and other options for treatment, the patient has consented to  Procedure(s): DISTAL INTERPHALANGEAL JOINT FUSION (Right) as a surgical intervention.  The patient's history has been reviewed, patient examined, no change in status, stable for surgery.  I have reviewed the patient's chart and labs.  Questions were answered to the patient's satisfaction.     Mireya Meditz

## 2024-07-03 NOTE — H&P (Signed)
 HAND SURGERY   HPI: Patient is a 83 y.o. female who presents with right index and middle finger DIP arthritis that is becoming increased lately painful and interfering with her daily activities.  Patient denies any changes to their medical history or new systemic symptoms today.    Past Medical History:  Diagnosis Date   A-fib (HCC)    Arthritis    hands (11/04/2015)   Dry eyes    GERD (gastroesophageal reflux disease)    Glaucoma    HTN (hypertension)    Osteopenia    Rosacea    Wears glasses    Past Surgical History:  Procedure Laterality Date   ATRIAL TACH ABLATION  11/04/2015   COLONOSCOPY     ELECTROPHYSIOLOGIC STUDY N/A 11/04/2015   Procedure: A-Tach Ablation;  Surgeon: Will Gladis Norton, MD;  Location: [facility];  Service: Cardiovascular;  Laterality: N/A;   LEFT HEART CATHETERIZATION WITH CORONARY ANGIOGRAM N/A 03/12/2014   Procedure: LEFT HEART CATHETERIZATION WITH CORONARY ANGIOGRAM;  Surgeon: Vinie KYM Maxcy, MD;  Location: [facility] CATH LAB;  Service: Cardiovascular;  Laterality: N/A;   LOOP RECORDER IMPLANT N/A 09/15/2014   Procedure: LOOP RECORDER IMPLANT;  Surgeon: Elspeth JAYSON Sage, MD;  Location: [facility] CATH LAB;  Service: Cardiovascular;  Laterality: N/A;   LOOP RECORDER REMOVAL N/A 09/26/2017   Procedure: LOOP RECORDER REMOVAL;  Surgeon: Norton Soyla Gladis, MD;  Location: [facility];  Service: Cardiovascular;  Laterality: N/A;   MASS EXCISION Right 09/25/2014   Procedure: RIGHT RING FINGER EXCISION MASS AND DEBRIDEMENT DIP JOINT;  Surgeon: Franky Curia, MD;  Location: [facility];  Service: Orthopedics;  Laterality: Right;   TONSILLECTOMY AND ADENOIDECTOMY  1979   TUBAL LIGATION     Social History   Socioeconomic History   Marital status: Widowed    Spouse name: Not on file   Number of children: Not on file   Years of education: Not on file   Highest education level: Not on file  Occupational History   Not on file  Tobacco Use    Smoking status: Former    Current packs/day: 0.00    Average packs/day: 0.5 packs/day for 25.0 years (12.5 ttl pk-yrs)    Types: Cigarettes    Start date: 11/22/1977    Quit date: 11/22/2002    Years since quitting: 21.6   Smokeless tobacco: Never  Vaping Use   Vaping status: Never Used  Substance and Sexual Activity   Alcohol use: Not Currently   Drug use: No   Sexual activity: Not Currently  Other Topics Concern   Not on file  Social History Narrative   Not on file   Social Drivers of Health   Financial Resource Strain: Not on file  Food Insecurity: Low Risk  (01/05/2024)   Received from [facility]   Hunger Vital Sign    Within the past 12 months, you worried that your food would run out before you got money to buy more: Never true    Within the past 12 months, the food you bought just didn't last and you didn't have money to get more. : Never true  Transportation Needs: No Transportation Needs (01/05/2024)   Received from [facility]    In the past 12 months, has lack of reliable transportation kept you from medical appointments, meetings, work or from getting things needed for daily living? 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 Authorizing Provider   acetaminophen  (TYLENOL ) 500 MG tablet Take 500 mg every 6 (six) hours as needed by mouth (pain).    Yes [provider]  ALPRAZolam  (XANAX ) 0.25 MG tablet TAKE 1 TABLET BY MOUTH AT BEDTIME AS NEEDED FOR ANXIETY 08/06/19  Yes Neysa Inocente PARAS, NP  amLODipine  (NORVASC ) 5 MG tablet TAKE 1.5 TABLETS BY MOUTH EVERY DAY 11/05/20  Yes Hilty, Vinie BROCKS, MD  Calcium-Magnesium 300-300 MG TABS Take 1 tablet by mouth 2 (two) times daily.    Yes [provider]  chlorthalidone  (HYGROTON ) 25 MG tablet TAKE 1/2 TABLET BY MOUTH DAILY 01/08/24  Yes Hilty, Vinie BROCKS, MD  famotidine (PEPCID) 20 MG tablet Take 20 mg by mouth 2 (two) times daily.   Yes [provider]  ibuprofen (ADVIL) 100 MG tablet Take by mouth.   Yes [provider]  lactobacillus acidophilus (BACID) TABS tablet Take 2 tablets by mouth 3 (three) times daily.   Yes [provider]  meloxicam (MOBIC) 7.5 MG tablet Take 7.5 mg by mouth daily.   Yes [provider]  Omega-3 Fatty Acids (FISH OIL PO) Take 1 capsule by mouth 2 (two) times daily.    Yes [provider]  potassium chloride  SA (KLOR-CON  M20) 20 MEQ tablet TAKE 2 TABLETS (40 MEQ TOTAL) BY MOUTH DAILY. PATIENT NEEDS APPT FOR ANY FUTURE REFILLS. 04/17/23  Yes Hilty, Vinie BROCKS, MD  simethicone (MYLICON) 125 MG chewable tablet Chew 125 mg by mouth every 6 (six) hours as needed for flatulence.   Yes [provider]  Crisaborole (EUCRISA) 2 % OINT Apply topically as needed. rosea    [provider]  hydroxypropyl methylcellulose (ISOPTO TEARS) 2.5 % ophthalmic solution Place 1 drop into both eyes as needed for dry eyes.    [provider]  omeprazole (PRILOSEC) 40 MG capsule Take 40 mg by mouth as needed.  06/20/19   [provider]  ondansetron  (ZOFRAN -ODT) 4 MG disintegrating tablet as needed.    [provider]   No results found. - Positive ROS: All other systems have been reviewed and were  otherwise negative with the exception of those mentioned in the HPI and as above.  Physical Exam: General: No acute distress, resting comfortably Cardiovascular: BUE warm and well perfused, normal rate Respiratory: Normal WOB on RA Skin: Warm and dry Neurologic: Sensation intact distally Psychiatric: Patient is at baseline mood and affect  Right upper Extremity  Large Heberden's nodes at the index and middle finger DIP joints with coronal deformity.  She has pain with active and passive range of motion of the index and middle finger DIP joints.  She has full painless range of motion of the ring fingers.  Sensation is intact light touch throughout the hand.  All fingers are pink and well-perfused with brisk cap refill.  Assessment: 83 year old female with right index and middle finger DIP arthritis that has failed conservative management.  Plan: OR today for right index and middle finger DIP arthrodesis. We again reviewed the risks of surgery which include bleeding, infection, damage to neurovascular structures, persistent symptoms, nonunion, malunion, delayed wound healing, need for additional surgery.  Informed consent was signed.  All questions were answered.   Bebe Galla, M.D. [facility] 9:57 AM

## 2024-07-03 NOTE — Transfer of Care (Signed)
 Immediate Anesthesia Transfer of Care Note  Patient: Kelsey James  Procedure(s) Performed: RIGHT DISTAL INTERPHALANGEAL JOINT FUSION MIDDLE AND INDEX FINGER (Right: Finger)  Patient Location: PACU  Anesthesia Type:MAC  Level of Consciousness: awake, alert , oriented, and patient cooperative  Airway & Oxygen Therapy: Patient Spontanous Breathing  Post-op Assessment: Report given to RN and Post -op Vital signs reviewed and stable  Post vital signs: Reviewed and stable  Last Vitals:  Vitals Value Taken Time  BP 125/62 07/03/24 12:20  Temp    Pulse 59 07/03/24 12:20  Resp 16 07/03/24 12:20  SpO2 94 % 07/03/24 12:20  Vitals shown include unfiled device data.  Last Pain:  Vitals:   07/03/24 0940  TempSrc: Temporal  PainSc: 7          Complications: No notable events documented.

## 2024-07-03 NOTE — Anesthesia Preprocedure Evaluation (Signed)
 Anesthesia Evaluation  Patient identified by MRN, date of birth, ID band Patient awake    Reviewed: Allergy & Precautions, H&P , NPO status   Airway Mallampati: I  TM Distance: >3 FB Neck ROM: Full    Dental  (+) Teeth Intact, Dental Advisory Given   Pulmonary former smoker   breath sounds clear to auscultation       Cardiovascular hypertension, Pt. on medications + dysrhythmias Atrial Fibrillation  Rhythm:Regular Rate:Normal     Neuro/Psych   Anxiety        GI/Hepatic ,GERD  ,,  Endo/Other    Renal/GU      Musculoskeletal  (+) Arthritis , Osteoarthritis,    Abdominal   Peds  Hematology   Anesthesia Other Findings   Reproductive/Obstetrics                              Anesthesia Physical Anesthesia Plan  ASA: 3  Anesthesia Plan: MAC   Post-op Pain Management: Minimal or no pain anticipated   Induction: Intravenous  PONV Risk Score and Plan: 2 and Ondansetron , Midazolam  and Treatment may vary due to age or medical condition  Airway Management Planned: Simple Face Mask  Additional Equipment:   Intra-op Plan:   Post-operative Plan:   Informed Consent: I have reviewed the patients History and Physical, chart, labs and discussed the procedure including the risks, benefits and alternatives for the proposed anesthesia with the patient or authorized representative who has indicated his/her understanding and acceptance.     Dental advisory given  Plan Discussed with: CRNA and Anesthesiologist  Anesthesia Plan Comments:          Anesthesia Quick Evaluation

## 2024-07-03 NOTE — Anesthesia Postprocedure Evaluation (Signed)
 Anesthesia Post Note  Patient: Kelsey James  Procedure(s) Performed: RIGHT DISTAL INTERPHALANGEAL JOINT FUSION MIDDLE AND INDEX FINGER (Right: Finger)     Patient location during evaluation: PACU Anesthesia Type: MAC Level of consciousness: awake and alert Pain management: pain level controlled Vital Signs Assessment: post-procedure vital signs reviewed and stable Respiratory status: spontaneous breathing, nonlabored ventilation and respiratory function stable Cardiovascular status: blood pressure returned to baseline and stable Postop Assessment: no apparent nausea or vomiting Anesthetic complications: no   No notable events documented.  Last Vitals:  Vitals:   07/03/24 1245 07/03/24 1257  BP: 127/60 (!) 149/70  Pulse: 60 64  Resp: 15 16  Temp:  36.8 C  SpO2: 94% 98%    Last Pain:  Vitals:   07/03/24 1257  TempSrc:   PainSc: 0-No pain                 Butler Levander Pinal

## 2024-07-05 ENCOUNTER — Encounter (HOSPITAL_BASED_OUTPATIENT_CLINIC_OR_DEPARTMENT_OTHER): Payer: Self-pay | Admitting: Orthopedic Surgery

## 2024-07-11 ENCOUNTER — Telehealth: Payer: Self-pay | Admitting: Podiatry

## 2024-07-11 NOTE — Telephone Encounter (Signed)
 DOS- 08/01/2024  SESAMOIDECTOMY LT- 28315 4TH DIGIT HAMMERTOE REPAIR LT- 71714  [employer]ET[employer] EFFECTIVE DATE- 11/15/2023  DEDUCTIBLE- $0 REMAINING- $0 OOP- $4150 REMAINING- $3626 COINSURANCE- 0%/$200 COPAY  PER [employer] WEBSITE, NO PRIOR AUTHS ARE REQUIRED FOR CPT CODES 71684 AND [phone]. DOCUMENTATION ATTACHED TO SURGERY CONSENT PACKET.

## 2024-07-17 ENCOUNTER — Telehealth: Payer: Self-pay | Admitting: Neurology

## 2024-07-17 NOTE — Telephone Encounter (Signed)
 Patient said, have had surgery and having complications will need to cancel appointment. Will call back.

## 2024-07-18 ENCOUNTER — Ambulatory Visit: Admitting: Neurology

## 2024-07-31 ENCOUNTER — Other Ambulatory Visit: Payer: Self-pay | Admitting: Internal Medicine

## 2024-07-31 MED ORDER — CHLORTHALIDONE 25 MG PO TABS: 12.5000 mg | ORAL_TABLET | Freq: Every day | ORAL | 0 refills | Status: AC

## 2024-08-01 ENCOUNTER — Other Ambulatory Visit: Payer: Self-pay | Admitting: Podiatry

## 2024-08-01 DIAGNOSIS — M84872 Other disorders of continuity of bone, left ankle and foot: Secondary | ICD-10-CM | POA: Diagnosis not present

## 2024-08-01 DIAGNOSIS — M2042 Other hammer toe(s) (acquired), left foot: Secondary | ICD-10-CM | POA: Diagnosis not present

## 2024-08-01 MED ORDER — IBUPROFEN 800 MG PO TABS: 800.0000 mg | ORAL_TABLET | Freq: Three times a day (TID) | ORAL | 1 refills | Status: AC

## 2024-08-01 MED ORDER — OXYCODONE-ACETAMINOPHEN 5-325 MG PO TABS: 1.0000 | ORAL_TABLET | ORAL | 0 refills | Status: AC | PRN

## 2024-08-01 NOTE — Progress Notes (Signed)
 PRN postop

## 2024-08-07 ENCOUNTER — Ambulatory Visit (INDEPENDENT_AMBULATORY_CARE_PROVIDER_SITE_OTHER)

## 2024-08-07 ENCOUNTER — Ambulatory Visit (INDEPENDENT_AMBULATORY_CARE_PROVIDER_SITE_OTHER): Payer: Self-pay | Admitting: Podiatry

## 2024-08-07 ENCOUNTER — Encounter: Payer: Self-pay | Admitting: Podiatry

## 2024-08-07 VITALS — Ht 63.0 in | Wt 115.8 lb

## 2024-08-07 DIAGNOSIS — M2042 Other hammer toe(s) (acquired), left foot: Secondary | ICD-10-CM

## 2024-08-07 NOTE — Progress Notes (Signed)
 Chief Complaint  Patient presents with   Routine Post Op    POV # 1 DOS 08/01/24 LT 4TH HAMMERTOE ARTHROPLASTY DISTAL INTER PHALANGEAL, LT FIBULAR SESMOIDECTOMY, pt states everything is going well has no complaints.    Subjective:  Patient presents today status post left fourth toe DIPJ arthroplasty and fibular sesamoidectomy.  DOS: 08/01/2024.  Doing well.  WBAT surgical shoe as instructed  Past Medical History:  Diagnosis Date   A-fib (HCC)    Arthritis    hands (11/04/2015)   Dry eyes    GERD (gastroesophageal reflux disease)    Glaucoma    HTN (hypertension)    Osteopenia    Rosacea    Wears glasses     Past Surgical History:  Procedure Laterality Date   ATRIAL TACH ABLATION  11/04/2015   COLONOSCOPY     DISTAL INTERPHALANGEAL JOINT FUSION Right 07/03/2024   Procedure: RIGHT DISTAL INTERPHALANGEAL JOINT FUSION MIDDLE AND INDEX FINGER;  Surgeon: Romona Harari, MD;  Location: [facility];  Service: Orthopedics;  Laterality: Right;   ELECTROPHYSIOLOGIC STUDY N/A 11/04/2015   Procedure: A-Tach Ablation;  Surgeon: Will Gladis Norton, MD;  [facility];  Service: Cardiovascular;  Laterality: N/A;   LEFT HEART CATHETERIZATION WITH CORONARY ANGIOGRAM N/A 03/12/2014   Procedure: LEFT HEART CATHETERIZATION WITH CORONARY ANGIOGRAM;  Surgeon: Vinie KYM Maxcy, MD;  Location: [facility] CATH LAB;  Service: Cardiovascular;  Laterality: N/A;   LOOP RECORDER IMPLANT N/A 09/15/2014   Procedure: LOOP RECORDER IMPLANT;  Surgeon: Elspeth JAYSON Sage, MD;  Location: [facility] CATH LAB;  Service: Cardiovascular;  Laterality: N/A;   LOOP RECORDER REMOVAL N/A 09/26/2017   Procedure: LOOP RECORDER REMOVAL;  Surgeon: Norton Soyla Gladis, MD;  [facility];  Service: Cardiovascular;  Laterality: N/A;   MASS EXCISION Right 09/25/2014   Procedure: RIGHT RING FINGER EXCISION MASS AND DEBRIDEMENT DIP JOINT;  Surgeon: Franky Curia, MD;  Location: [facility];   Service: Orthopedics;  Laterality: Right;   TONSILLECTOMY AND ADENOIDECTOMY  1979   TUBAL LIGATION      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  Incision well coapted with sutures intact. No sign of infectious process noted. No dehiscence. No active bleeding noted.  Moderate edema noted to the surgical extremity.  Radiographic Exam LT foot 08/07/2024:  Interval resection of the middle phalanx of the left fourth toe as well as absence of the fibular sesamoid noted.  No acute fracture identified.  No gas within the tissue.  Assessment: 1. s/p DIPJ arthroplasty left fourth toe.  Fibular sesamoidectomy left. DOS: 08/01/2024   Plan of Care:  -Patient was evaluated. X-rays reviewed - Dressings changed -Continue WBAT surgical shoe -Return to clinic 1 week possible suture removal   Thresa EMERSON Sar, DPM [facility]  Dr. Thresa EMERSON Sar, DPM    [address]  [place], [place] [address]                Office [phone]  Fax [phone]

## 2024-08-14 ENCOUNTER — Encounter: Payer: Self-pay | Admitting: Podiatry

## 2024-08-14 ENCOUNTER — Ambulatory Visit (INDEPENDENT_AMBULATORY_CARE_PROVIDER_SITE_OTHER): Admitting: Podiatry

## 2024-08-14 VITALS — Ht 63.0 in | Wt 115.8 lb

## 2024-08-14 DIAGNOSIS — M2042 Other hammer toe(s) (acquired), left foot: Secondary | ICD-10-CM

## 2024-08-14 NOTE — Progress Notes (Signed)
 Chief Complaint  Patient presents with   Routine Post Op     POV # 2 DOS 08/01/24 LT 4TH HAMMERTOE ARTHROPLASTY DISTAL INTER PHALANGEAL, LT FIBULAR SESMOIDECTOMY, she states everything is going well has no complaints.    Subjective:  Patient presents today status post left fourth toe DIPJ arthroplasty and fibular sesamoidectomy.  DOS: 08/01/2024.  Doing well.  WBAT surgical shoe as instructed  Past Medical History:  Diagnosis Date   A-fib (HCC)    Arthritis    hands (11/04/2015)   Dry eyes    GERD (gastroesophageal reflux disease)    Glaucoma    HTN (hypertension)    Osteopenia    Rosacea    Wears glasses     Past Surgical History:  Procedure Laterality Date   ATRIAL TACH ABLATION  11/04/2015   COLONOSCOPY     DISTAL INTERPHALANGEAL JOINT FUSION Right 07/03/2024   Procedure: RIGHT DISTAL INTERPHALANGEAL JOINT FUSION MIDDLE AND INDEX FINGER;  Surgeon: Romona Harari, MD;  Location: [facility];  Service: Orthopedics;  Laterality: Right;   ELECTROPHYSIOLOGIC STUDY N/A 11/04/2015   Procedure: A-Tach Ablation;  Surgeon: Will Gladis Norton, MD;  [facility];  Service: Cardiovascular;  Laterality: N/A;   LEFT HEART CATHETERIZATION WITH CORONARY ANGIOGRAM N/A 03/12/2014   Procedure: LEFT HEART CATHETERIZATION WITH CORONARY ANGIOGRAM;  Surgeon: Vinie KYM Maxcy, MD;  Location[facility] C[facility] LAB;  Service: Cardiovascular;  Laterality: N/A;   LOOP RECORDER IMPLANT N/A 09/15/2014   Procedure: LOOP RECORDER IMPLANT;  Surgeon: Elspeth JAYSON Sage, MD;  Location: [facility] CATH LAB;  Service: Cardiovascular;  Laterality: N/A;   LOOP RECORDER REMOVAL N/A 09/26/2017   Procedure: LOOP RECORDER REMOVAL;  Surgeon: Norton Soyla Gladis, MD;  [facility];  Service: Cardiovascular;  Laterality: N/A;   MASS EXCISION Right 09/25/2014   Procedure: RIGHT RING FINGER EXCISION MASS AND DEBRIDEMENT DIP JOINT;  Surgeon: Franky Curia, MD;  Location: [facility];   Service: Orthopedics;  Laterality: Right;   TONSILLECTOMY AND ADENOIDECTOMY  1979   TUBAL LIGATION      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  Incision well coapted with sutures intact. No sign of infectious process noted. No dehiscence. No active bleeding noted.  Moderate edema noted to the surgical extremity.  Radiographic Exam LT foot 08/07/2024:  Interval resection of the middle phalanx of the left fourth toe as well as absence of the fibular sesamoid noted.  No acute fracture identified.  No gas within the tissue.  Assessment: 1. s/p DIPJ arthroplasty left fourth toe.  Fibular sesamoidectomy left. DOS: 08/01/2024   Plan of Care:  -Patient was evaluated.  -Sutures removed -Okay to transition out of the postop shoe using good supportive tennis shoes and sneakers -Return to clinic 2 weeks follow-up x-ray   Thresa EMERSON Sar, DPM [facility]  Dr. Thresa EMERSON Sar, DPM    [address]  [place], [place] [address]                Office [phone]  Fax [phone]

## 2024-08-28 ENCOUNTER — Ambulatory Visit: Admitting: Podiatry

## 2024-08-28 ENCOUNTER — Ambulatory Visit

## 2024-08-28 DIAGNOSIS — M2042 Other hammer toe(s) (acquired), left foot: Secondary | ICD-10-CM | POA: Diagnosis not present

## 2024-08-28 NOTE — Progress Notes (Signed)
 Chief Complaint  Patient presents with   Foot Pain    L 3rd met plantar still some soreness.  Not diabetic no anti coag    Subjective:  Patient presents today status post left fourth toe DIPJ arthroplasty and fibular sesamoidectomy.  DOS: 08/01/2024.  Currently walking in tennis shoes.  Past Medical History:  Diagnosis Date   A-fib (HCC)    Arthritis    hands (11/04/2015)   Dry eyes    GERD (gastroesophageal reflux disease)    Glaucoma    HTN (hypertension)    Osteopenia    Rosacea    Wears glasses     Past Surgical History:  Procedure Laterality Date   ATRIAL TACH ABLATION  11/04/2015   COLONOSCOPY     DISTAL INTERPHALANGEAL JOINT FUSION Right 07/03/2024   Procedure: RIGHT DISTAL INTERPHALANGEAL JOINT FUSION MIDDLE AND INDEX FINGER;  Surgeon: Romona Harari, MD;  Location: [facility];  Service: Orthopedics;  Laterality: Right;   ELECTROPHYSIOLOGIC STUDY N/A 11/04/2015   Procedure: A-Tach Ablation;  Surgeon: Will Gladis Norton, MD;  [facility];  Service: Cardiovascular;  Laterality: N/A;   LEFT HEART CATHETERIZATION WITH CORONARY ANGIOGRAM N/A 03/12/2014   Procedure: LEFT HEART CATHETERIZATION WITH CORONARY ANGIOGRAM;  Surgeon: Vinie KYM Maxcy, MD;  Location: [facility] CATH LAB;  Service: Cardiovascular;  Laterality: N/A;   LOOP RECORDER IMPLANT N/A 09/15/2014   Procedure: LOOP RECORDER IMPLANT;  Surgeon: Elspeth JAYSON Sage, MD;  Location: [facility] CATH LAB;  Service: Cardiovascular;  Laterality: N/A;   LOOP RECORDER REMOVAL N/A 09/26/2017   Procedure: LOOP RECORDER REMOVAL;  Surgeon: Norton Soyla Gladis, MD;  [facility];  Service: Cardiovascular;  Laterality: N/A;   MASS EXCISION Right 09/25/2014   Procedure: RIGHT RING FINGER EXCISION MASS AND DEBRIDEMENT DIP JOINT;  Surgeon: Franky Curia, MD;  Location: [facility];  Service: Orthopedics;  Laterality: Right;   TONSILLECTOMY AND ADENOIDECTOMY  1979   TUBAL LIGATION       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  Incisions are well healed.  There continues to be some mild edema to the fourth toe with tenderness to the plantar aspect of the first MTP.  Radiographic Exam LT foot 08/28/2024:  Unchanged.  Interval resection of the middle phalanx of the left fourth toe as well as absence of the fibular sesamoid noted.  No acute fracture identified.  No gas within the tissue.  Assessment: 1. s/p DIPJ arthroplasty left fourth toe.  Fibular sesamoidectomy left. DOS: 08/01/2024   Plan of Care:  -Patient was evaluated.  X-rays reviewed - Continue good supporting shoes and sneakers -Slowly increase activity as tolerated -Return to clinic 6 weeks follow-up x-ray    Thresa EMERSON Sar, DPM [facility]  Dr. Thresa EMERSON Sar, DPM    [address], [place] [address]  Office [phone]  Fax [phone]

## 2024-10-09 ENCOUNTER — Ambulatory Visit (INDEPENDENT_AMBULATORY_CARE_PROVIDER_SITE_OTHER)

## 2024-10-09 ENCOUNTER — Ambulatory Visit: Admitting: Podiatry

## 2024-10-09 DIAGNOSIS — M2042 Other hammer toe(s) (acquired), left foot: Secondary | ICD-10-CM

## 2024-10-09 NOTE — Progress Notes (Signed)
 Chief Complaint  Patient presents with   Hammer Toe      POV # 4 DOS 08/01/24 LT 4TH HAMMERTOE ARTHROPLASTY DISTAL INTER PHALANGEAL, LT FIBULAR SESMOIDECTOMY, she states everything is going well has no complaints.       Subjective:  Patient presents today status post left fourth toe DIPJ arthroplasty and fibular sesamoidectomy.  DOS: 08/01/2024.  Currently walking in tennis shoes.  Past Medical History:  Diagnosis Date   A-fib (HCC)    Arthritis    hands (11/04/2015)   Dry eyes    GERD (gastroesophageal reflux disease)    Glaucoma    HTN (hypertension)    Osteopenia    Rosacea    Wears glasses     Past Surgical History:  Procedure Laterality Date   ATRIAL TACH ABLATION  11/04/2015   COLONOSCOPY     DISTAL INTERPHALANGEAL JOINT FUSION Right 07/03/2024   Procedure: RIGHT DISTAL INTERPHALANGEAL JOINT FUSION MIDDLE AND INDEX FINGER;  Surgeon: Romona Harari, MD;  Location: [facility];  Service: Orthopedics;  Laterality: Right;   ELECTROPHYSIOLOGIC STUDY N/A 11/04/2015   Procedure: A-Tach Ablation;  Surgeon: Will Gladis Norton, MD;  [facility];  Service: Cardiovascular;  Laterality: N/A;   LEFT HEART CATHETERIZATION WITH CORONARY ANGIOGRAM N/A 03/12/2014   Procedure: LEFT HEART CATHETERIZATION WITH CORONARY ANGIOGRAM;  Surgeon: Vinie KYM Maxcy, MD;  Location: [facility] CATH LAB;  Service: Cardiovascular;  Laterality: N/A;   LOOP RECORDER IMPLANT N/A 09/15/2014   Procedure: LOOP RECORDER IMPLANT;  Surgeon: Elspeth JAYSON Sage, MD;  Location: [facility] CATH LAB;  Service: Cardiovascular;  Laterality: N/A;   LOOP RECORDER REMOVAL N/A 09/26/2017   Procedure: LOOP RECORDER REMOVAL;  Surgeon: Norton Soyla Gladis, MD;  [facility];  Service: Cardiovascular;  Laterality: N/A;   MASS EXCISION Right 09/25/2014   Procedure: RIGHT RING FINGER EXCISION MASS AND DEBRIDEMENT DIP JOINT;  Surgeon: Franky Curia, MD;  Location: [facility];   Service: Orthopedics;  Laterality: Right;   TONSILLECTOMY AND ADENOIDECTOMY  1979   TUBAL LIGATION      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  Incisions are well healed.  Toes in rectus alignment.  No tenderness to palpation  Radiographic Exam LT foot 10/09/2024:  Unchanged.  Interval resection of the middle phalanx of the left fourth toe as well as absence of the fibular sesamoid noted.  No acute fracture identified.  No gas within the tissue.  Assessment: 1. s/p DIPJ arthroplasty left fourth toe.  Fibular sesamoidectomy left. DOS: 08/01/2024   Plan of Care:  -Patient was evaluated.  X-rays reviewed - Continue good supporting shoes and sneakers - Full activity no restrictions - Return to clinic PRN    Thresa EMERSON Sar, DPM [facility]  Dr. Thresa EMERSON Sar, DPM    [address], [place] [address]  Office [phone]  Fax [phone]
